# Patient Record
Sex: Male | Born: 1945
Health system: Southern US, Community
[De-identification: ages and names within clinical notes are randomized; demographics above are authoritative.]

## PROBLEM LIST (undated history)

## (undated) DIAGNOSIS — N400 Enlarged prostate without lower urinary tract symptoms: Secondary | ICD-10-CM

## (undated) DIAGNOSIS — D496 Neoplasm of unspecified behavior of brain: Secondary | ICD-10-CM

## (undated) DIAGNOSIS — R339 Retention of urine, unspecified: Secondary | ICD-10-CM

## (undated) DIAGNOSIS — E785 Hyperlipidemia, unspecified: Secondary | ICD-10-CM

## (undated) DIAGNOSIS — I10 Essential (primary) hypertension: Secondary | ICD-10-CM

## (undated) DIAGNOSIS — H9190 Unspecified hearing loss, unspecified ear: Secondary | ICD-10-CM

## (undated) DIAGNOSIS — T7840XA Allergy, unspecified, initial encounter: Secondary | ICD-10-CM

## (undated) DIAGNOSIS — M1990 Unspecified osteoarthritis, unspecified site: Secondary | ICD-10-CM

## (undated) DIAGNOSIS — H8390 Unspecified disease of inner ear, unspecified ear: Secondary | ICD-10-CM

## (undated) HISTORY — DX: Hyperlipidemia, unspecified: E78.5

## (undated) HISTORY — PX: NERVE GRAFT: SHX721

## (undated) HISTORY — DX: Benign prostatic hyperplasia without lower urinary tract symptoms: N40.0

## (undated) HISTORY — DX: Neoplasm of unspecified behavior of brain: D49.6

## (undated) HISTORY — DX: Allergy, unspecified, initial encounter: T78.40XA

## (undated) HISTORY — DX: Essential (primary) hypertension: I10

## (undated) HISTORY — DX: Unspecified osteoarthritis, unspecified site: M19.90

## (undated) HISTORY — PX: BRAIN TUMOR EXCISION: SHX577

## (undated) HISTORY — PX: JOINT REPLACEMENT: SHX530

## (undated) HISTORY — DX: Unspecified hearing loss, unspecified ear: H91.90

## (undated) HISTORY — PX: CARDIAC CATHETERIZATION: SHX172

## (undated) HISTORY — PX: WISDOM TOOTH EXTRACTION: SHX21

## (undated) HISTORY — PX: TOTAL HIP ARTHROPLASTY: SHX124

---
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
## 2000-03-09 ENCOUNTER — Emergency Department (HOSPITAL_COMMUNITY): Admission: EM | Admit: 2000-03-09 | Discharge: 2000-03-09 | Payer: Self-pay | Admitting: *Deleted

## 2002-10-14 ENCOUNTER — Encounter: Payer: Self-pay | Admitting: Emergency Medicine

## 2002-10-14 ENCOUNTER — Encounter: Admission: RE | Admit: 2002-10-14 | Discharge: 2002-10-14 | Payer: Self-pay | Admitting: Emergency Medicine

## 2008-06-20 DIAGNOSIS — N400 Enlarged prostate without lower urinary tract symptoms: Secondary | ICD-10-CM

## 2008-06-20 HISTORY — DX: Benign prostatic hyperplasia without lower urinary tract symptoms: N40.0

## 2009-04-09 LAB — HM COLONOSCOPY: HM Colonoscopy: NORMAL

## 2011-04-21 DIAGNOSIS — Z96649 Presence of unspecified artificial hip joint: Secondary | ICD-10-CM | POA: Insufficient documentation

## 2012-06-28 ENCOUNTER — Ambulatory Visit (INDEPENDENT_AMBULATORY_CARE_PROVIDER_SITE_OTHER): Payer: 59 | Admitting: Internal Medicine

## 2012-06-28 ENCOUNTER — Encounter: Payer: Self-pay | Admitting: Internal Medicine

## 2012-06-28 ENCOUNTER — Other Ambulatory Visit (INDEPENDENT_AMBULATORY_CARE_PROVIDER_SITE_OTHER): Payer: 59

## 2012-06-28 VITALS — BP 136/88 | HR 78 | Temp 98.5°F | Resp 16 | Ht 69.0 in | Wt 190.2 lb

## 2012-06-28 DIAGNOSIS — E7800 Pure hypercholesterolemia, unspecified: Secondary | ICD-10-CM

## 2012-06-28 DIAGNOSIS — R972 Elevated prostate specific antigen [PSA]: Secondary | ICD-10-CM

## 2012-06-28 DIAGNOSIS — Z23 Encounter for immunization: Secondary | ICD-10-CM

## 2012-06-28 DIAGNOSIS — N400 Enlarged prostate without lower urinary tract symptoms: Secondary | ICD-10-CM

## 2012-06-28 DIAGNOSIS — I10 Essential (primary) hypertension: Secondary | ICD-10-CM

## 2012-06-28 DIAGNOSIS — Z1211 Encounter for screening for malignant neoplasm of colon: Secondary | ICD-10-CM | POA: Insufficient documentation

## 2012-06-28 DIAGNOSIS — Z0000 Encounter for general adult medical examination without abnormal findings: Secondary | ICD-10-CM

## 2012-06-28 DIAGNOSIS — Z0001 Encounter for general adult medical examination with abnormal findings: Secondary | ICD-10-CM | POA: Insufficient documentation

## 2012-06-28 LAB — CBC WITH DIFFERENTIAL/PLATELET
Basophils Absolute: 0 10*3/uL (ref 0.0–0.1)
Basophils Relative: 0.6 % (ref 0.0–3.0)
Eosinophils Absolute: 0.2 10*3/uL (ref 0.0–0.7)
Lymphocytes Relative: 27.6 % (ref 12.0–46.0)
MCHC: 34.3 g/dL (ref 30.0–36.0)
Neutrophils Relative %: 59.4 % (ref 43.0–77.0)
RBC: 4.38 Mil/uL (ref 4.22–5.81)
RDW: 12.8 % (ref 11.5–14.6)

## 2012-06-28 LAB — URINALYSIS, ROUTINE W REFLEX MICROSCOPIC
Hgb urine dipstick: NEGATIVE
Nitrite: NEGATIVE
Specific Gravity, Urine: 1.015 (ref 1.000–1.030)
Total Protein, Urine: NEGATIVE
pH: 6 (ref 5.0–8.0)

## 2012-06-28 LAB — COMPREHENSIVE METABOLIC PANEL
AST: 29 U/L (ref 0–37)
Albumin: 4.6 g/dL (ref 3.5–5.2)
BUN: 14 mg/dL (ref 6–23)
CO2: 26 mEq/L (ref 19–32)
Calcium: 9.8 mg/dL (ref 8.4–10.5)
Chloride: 96 mEq/L (ref 96–112)
GFR: 88.92 mL/min (ref >60.00)
Potassium: 4.6 mEq/L (ref 3.5–5.1)

## 2012-06-28 LAB — LIPID PANEL
Cholesterol: 193 mg/dL (ref 0–200)
LDL Cholesterol: 114 mg/dL — ABNORMAL HIGH (ref 0–99)

## 2012-06-28 LAB — PSA: PSA: 4.62 ng/mL — ABNORMAL HIGH (ref 0.10–4.00)

## 2012-06-28 MED ORDER — HYDROCHLOROTHIAZIDE 25 MG PO TABS: 25.0000 mg | ORAL_TABLET | Freq: Every day | ORAL | Status: DC

## 2012-06-28 MED ORDER — LISINOPRIL 40 MG PO TABS: 40.0000 mg | ORAL_TABLET | Freq: Every day | ORAL | Status: DC

## 2012-06-28 NOTE — Assessment & Plan Note (Signed)
His BP is well controlled I will check his lytes and renal function 

## 2012-06-28 NOTE — Progress Notes (Signed)
Subjective:    Patient ID: Samuel Fisher, male    DOB: 04-13-1946, 67 y.o.   MRN: [id number]  Hypertension This is a chronic problem. The current episode started more than 1 year ago. The problem is unchanged. The problem is controlled. Pertinent negatives include no anxiety, blurred vision, chest pain, headaches, malaise/fatigue, neck pain, orthopnea, palpitations, peripheral edema, PND, shortness of breath or sweats. Agents associated with hypertension include NSAIDs. Past treatments include diuretics and ACE inhibitors. The current treatment provides significant improvement. There are no compliance problems.       Review of Systems  Constitutional: Negative.  Negative for malaise/fatigue.  HENT: Negative.  Negative for neck pain.   Eyes: Negative.  Negative for blurred vision.  Respiratory: Negative for apnea, cough, choking, chest tightness, shortness of breath, wheezing and stridor.   Cardiovascular: Negative for chest pain, palpitations, orthopnea, leg swelling and PND.  Gastrointestinal: Negative.  Negative for abdominal pain, diarrhea, constipation and blood in stool.  Genitourinary: Positive for difficulty urinating (nocturia, hesitancy, weak stream). Negative for dysuria, urgency, frequency, hematuria, flank pain, decreased urine volume, discharge, penile swelling, scrotal swelling, enuresis, genital sores, penile pain and testicular pain.  Musculoskeletal: Positive for arthralgias. Negative for myalgias, back pain, joint swelling and gait problem.  Skin: Negative.   Neurological: Negative.  Negative for dizziness, syncope, weakness, light-headedness and headaches.  Hematological: Negative for adenopathy. Does not bruise/bleed easily.  Psychiatric/Behavioral: Negative.        Objective:   Physical Exam  Vitals reviewed. Constitutional: He is oriented to person, place, and time. He appears well-developed and well-nourished. No distress.  HENT:  Head: Normocephalic and  atraumatic.  Mouth/Throat: Oropharynx is clear and moist. No oropharyngeal exudate.  Eyes: Conjunctivae normal are normal. Right eye exhibits no discharge. Left eye exhibits no discharge. No scleral icterus.  Neck: Normal range of motion. Neck supple. No JVD present. No tracheal deviation present. No thyromegaly present.  Cardiovascular: Normal rate, regular rhythm, normal heart sounds and intact distal pulses.  Exam reveals no gallop and no friction rub.   No murmur heard. Pulmonary/Chest: Effort normal and breath sounds normal. No stridor. No respiratory distress. He has no wheezes. He has no rales. He exhibits no tenderness.  Abdominal: Soft. Bowel sounds are normal. He exhibits no distension and no mass. There is no tenderness. There is no rebound and no guarding. Hernia confirmed negative in the right inguinal area and confirmed negative in the left inguinal area.  Genitourinary: Rectum normal, testes normal and penis normal. Rectal exam shows no external hemorrhoid, no internal hemorrhoid, no fissure, no mass, no tenderness and anal tone normal. Guaiac negative stool. Prostate is enlarged (2+ bilat symm BPH). Prostate is not tender. Right testis shows no mass, no swelling and no tenderness. Right testis is descended. Left testis shows no mass, no swelling and no tenderness. Left testis is descended. Circumcised. No penile erythema or penile tenderness. No discharge found.  Musculoskeletal: Normal range of motion. He exhibits no edema and no tenderness.  Lymphadenopathy:    He has no cervical adenopathy.       Right: No inguinal adenopathy present.       Left: No inguinal adenopathy present.  Neurological: He is oriented to person, place, and time.  Skin: Skin is warm and dry. No rash noted. He is not diaphoretic. No erythema. No pallor.  Psychiatric: He has a normal mood and affect. His behavior is normal. Judgment and thought content normal.  Assessment & Plan:

## 2012-06-28 NOTE — Assessment & Plan Note (Signed)
FLP CMP TSH today 

## 2012-06-28 NOTE — Assessment & Plan Note (Signed)
I will check his PSA today He does not wish to treat this at this time

## 2012-06-28 NOTE — Addendum Note (Signed)
Addended by: Etta Grandchild on: 06/28/2012 01:32 PM   Modules accepted: Orders

## 2012-06-28 NOTE — Assessment & Plan Note (Signed)
Exam done Vaccines were reviewed and updated Labs ordered Pt ed material was given 

## 2012-06-28 NOTE — Patient Instructions (Signed)
Health Maintenance, Males A healthy lifestyle and preventative care can promote health and wellness.  Maintain regular health, dental, and eye exams.  Eat a healthy diet. Foods like vegetables, fruits, whole grains, low-fat dairy products, and lean protein foods contain the nutrients you need without too many calories. Decrease your intake of foods high in solid fats, added sugars, and salt. Get information about a proper diet from your caregiver, if necessary.  Regular physical exercise is one of the most important things you can do for your health. Most adults should get at least 150 minutes of moderate-intensity exercise (any activity that increases your heart rate and causes you to sweat) each week. In addition, most adults need muscle-strengthening exercises on 2 or more days a week.   Maintain a healthy weight. The body mass index (BMI) is a screening tool to identify possible weight problems. It provides an estimate of body fat based on height and weight. Your caregiver can help determine your BMI, and can help you achieve or maintain a healthy weight. For adults 20 years and older:  A BMI below 18.5 is considered underweight.  A BMI of 18.5 to 24.9 is normal.  A BMI of 25 to 29.9 is considered overweight.  A BMI of 30 and above is considered obese.  Maintain normal blood lipids and cholesterol by exercising and minimizing your intake of saturated fat. Eat a balanced diet with plenty of fruits and vegetables. Blood tests for lipids and cholesterol should begin at age 20 and be repeated every 5 years. If your lipid or cholesterol levels are high, you are over 50, or you are a high risk for heart disease, you may need your cholesterol levels checked more frequently.Ongoing high lipid and cholesterol levels should be treated with medicines, if diet and exercise are not effective.  If you smoke, find out from your caregiver how to quit. If you do not use tobacco, do not start.  If you  choose to drink alcohol, do not exceed 2 drinks per day. One drink is considered to be 12 ounces (355 mL) of beer, 5 ounces (148 mL) of wine, or 1.5 ounces (44 mL) of liquor.  Avoid use of street drugs. Do not share needles with anyone. Ask for help if you need support or instructions about stopping the use of drugs.  High blood pressure causes heart disease and increases the risk of stroke. Blood pressure should be checked at least every 1 to 2 years. Ongoing high blood pressure should be treated with medicines if weight loss and exercise are not effective.  If you are 45 to 65 years old, ask your caregiver if you should take aspirin to prevent heart disease.  Diabetes screening involves taking a blood sample to check your fasting blood sugar level. This should be done once every 3 years, after age 45, if you are within normal weight and without risk factors for diabetes. Testing should be considered at a younger age or be carried out more frequently if you are overweight and have at least 1 risk factor for diabetes.  Colorectal cancer can be detected and often prevented. Most routine colorectal cancer screening begins at the age of 50 and continues through age 75. However, your caregiver may recommend screening at an earlier age if you have risk factors for colon cancer. On a yearly basis, your caregiver may provide home test kits to check for hidden blood in the stool. Use of a small camera at the end of a tube,   to directly examine the colon (sigmoidoscopy or colonoscopy), can detect the earliest forms of colorectal cancer. Talk to your caregiver about this at age 50, when routine screening begins. Direct examination of the colon should be repeated every 5 to 10 years through age 75, unless early forms of pre-cancerous polyps or small growths are found.  Hepatitis C blood testing is recommended for all people born from 1945 through 1965 and any individual with known risks for hepatitis C.  Healthy  men should no longer receive prostate-specific antigen (PSA) blood tests as part of routine cancer screening. Consult with your caregiver about prostate cancer screening.  Testicular cancer screening is not recommended for adolescents or adult males who have no symptoms. Screening includes self-exam, caregiver exam, and other screening tests. Consult with your caregiver about any symptoms you have or any concerns you have about testicular cancer.  Practice safe sex. Use condoms and avoid high-risk sexual practices to reduce the spread of sexually transmitted infections (STIs).  Use sunscreen with a sun protection factor (SPF) of 30 or greater. Apply sunscreen liberally and repeatedly throughout the day. You should seek shade when your shadow is shorter than you. Protect yourself by wearing long sleeves, pants, a wide-brimmed hat, and sunglasses year round, whenever you are outdoors.  Notify your caregiver of new moles or changes in moles, especially if there is a change in shape or color. Also notify your caregiver if a mole is larger than the size of a pencil eraser.  A one-time screening for abdominal aortic aneurysm (AAA) and surgical repair of large AAAs by sound wave imaging (ultrasonography) is recommended for ages 65 to 75 years who are current or former smokers.  Stay current with your immunizations. Document Released: 12/03/2007 Document Revised: 08/29/2011 Document Reviewed: 11/01/2010 [employer] Patient Information 2013 [employer].  

## 2012-10-16 ENCOUNTER — Encounter: Payer: Self-pay | Admitting: Internal Medicine

## 2012-10-16 DIAGNOSIS — I10 Essential (primary) hypertension: Secondary | ICD-10-CM

## 2012-10-16 MED ORDER — HYDROCHLOROTHIAZIDE 25 MG PO TABS: 25.0000 mg | ORAL_TABLET | Freq: Every day | ORAL | Status: DC

## 2012-10-16 MED ORDER — LISINOPRIL 40 MG PO TABS: 40.0000 mg | ORAL_TABLET | Freq: Every day | ORAL | Status: DC

## 2013-01-23 ENCOUNTER — Other Ambulatory Visit: Payer: Self-pay

## 2013-01-28 DIAGNOSIS — M169 Osteoarthritis of hip, unspecified: Secondary | ICD-10-CM | POA: Insufficient documentation

## 2013-04-25 ENCOUNTER — Other Ambulatory Visit: Payer: Self-pay

## 2013-06-27 ENCOUNTER — Ambulatory Visit (INDEPENDENT_AMBULATORY_CARE_PROVIDER_SITE_OTHER): Payer: Medicare Other | Admitting: *Deleted

## 2013-06-27 DIAGNOSIS — Z23 Encounter for immunization: Secondary | ICD-10-CM

## 2014-03-12 ENCOUNTER — Ambulatory Visit (INDEPENDENT_AMBULATORY_CARE_PROVIDER_SITE_OTHER): Payer: Medicare Other

## 2014-03-12 DIAGNOSIS — Z23 Encounter for immunization: Secondary | ICD-10-CM

## 2014-11-19 ENCOUNTER — Ambulatory Visit (INDEPENDENT_AMBULATORY_CARE_PROVIDER_SITE_OTHER): Payer: Medicare Other | Admitting: Family Medicine

## 2014-11-19 VITALS — BP 132/84 | HR 102 | Temp 99.3°F | Resp 16 | Ht 69.0 in | Wt 184.4 lb

## 2014-11-19 DIAGNOSIS — L237 Allergic contact dermatitis due to plants, except food: Secondary | ICD-10-CM

## 2014-11-19 DIAGNOSIS — Z91048 Other nonmedicinal substance allergy status: Secondary | ICD-10-CM | POA: Diagnosis not present

## 2014-11-19 DIAGNOSIS — Z9109 Other allergy status, other than to drugs and biological substances: Secondary | ICD-10-CM

## 2014-11-19 MED ORDER — EPINEPHRINE 0.3 MG/0.3ML IJ SOAJ: 0.3000 mg | Freq: Once | INTRAMUSCULAR | Status: DC

## 2014-11-19 MED ORDER — METHYLPREDNISOLONE ACETATE 80 MG/ML IJ SUSP
80.0000 mg | Freq: Once | INTRAMUSCULAR | Status: AC
  Administered 2014-11-19: 80 mg via INTRAMUSCULAR

## 2014-11-19 NOTE — Patient Instructions (Signed)
Take Benadryl 25 mg every 6-8 hours until resolved Use hibiclens to clean wound 1-2 times daily Use calamine  Poison Ivy Poison ivy is a inflammation of the skin (contact dermatitis) caused by touching the allergens on the leaves of the ivy plant following previous exposure to the plant. The rash usually appears 48 hours after exposure. The rash is usually bumps (papules) or blisters (vesicles) in a linear pattern. Depending on your own sensitivity, the rash may simply cause redness and itching, or it may also progress to blisters which may break open. These must be well cared for to prevent secondary bacterial (germ) infection, followed by scarring. Keep any open areas dry, clean, dressed, and covered with an antibacterial ointment if needed. The eyes may also get puffy. The puffiness is worst in the morning and gets better as the day progresses. This dermatitis usually heals without scarring, within 2 to 3 weeks without treatment. HOME CARE INSTRUCTIONS  Thoroughly wash with soap and water as soon as you have been exposed to poison ivy. You have about one half hour to remove the plant resin before it will cause the rash. This washing will destroy the oil or antigen on the skin that is causing, or will cause, the rash. Be sure to wash under your fingernails as any plant resin there will continue to spread the rash. Do not rub skin vigorously when washing affected area. Poison ivy cannot spread if no oil from the plant remains on your body. A rash that has progressed to weeping sores will not spread the rash unless you have not washed thoroughly. It is also important to wash any clothes you have been wearing as these may carry active allergens. The rash will return if you wear the unwashed clothing, even several days later. Avoidance of the plant in the future is the best measure. Poison ivy plant can be recognized by the number of leaves. Generally, poison ivy has three leaves with flowering branches on a  single stem. Diphenhydramine may be purchased over the counter and used as needed for itching. Do not drive with this medication if it makes you drowsy.Ask your caregiver about medication for children. SEEK MEDICAL CARE IF:  Open sores develop.  Redness spreads beyond area of rash.  You notice purulent (pus-like) discharge.  You have increased pain.  Other signs of infection develop (such as fever). Document Released: 06/03/2000 Document Revised: 08/29/2011 Document Reviewed: 11/14/2008 [facility] Patient Information 2015 [place], [place]. This information is not intended to replace advice given to you by your health care provider. Make sure you discuss any questions you have with your health care provider.

## 2014-11-19 NOTE — Progress Notes (Signed)
 Chief Complaint:  Chief Complaint  Patient presents with  . Rash    x 5 days ago, was working in yard thinks he has posion ivy, on both arms and knees  . Insect Bite    Genital area, Has cleared up some    HPI: Samuel Fisher is a 69 y.o. male who is here for posion ivy rash and bug bite on his penis He is ok with the bug bite on his genitals, no reaction. The rash has improved greatly. He does not know about it was. He does not think it was a tick. He has allergies to wasp, he has been to the ER for that 35 years ago, he breaks out in welps He denies SOB or CP   Was working in his yard and he cuts of exposure to poison IV/okay and has developed a rash on his arms or legs. He is tried over-the-counter medications without relief. This was 5 days ago.  Past Medical History  Diagnosis Date  . Arthritis   . Allergy   . Hypertension   . BPH (benign prostatic hyperplasia) 2010   Past Surgical History  Procedure Laterality Date  . Tonsillectomy  1951  . Total hip arthroplasty      right-2002 and left-2004, 2010  . Joint replacement     History   Social History  . Marital Status: Married    Spouse Name: N/A  . Number of Children: N/A  . Years of Education: N/A   Social History Main Topics  . Smoking status: Former Smoker    Quit date: 06/28/1976  . Smokeless tobacco: Never Used  . Alcohol Use: 1.8 oz/week    3 Cans of beer per week  . Drug Use: No  . Sexual Activity: Yes   Other Topics Concern  . None   Social History Narrative   Family History  Problem Relation Age of Onset  . Arthritis Other   . Heart disease Other   . Hypertension Other   . Stroke Other   . Early death Other   . Mental illness Other   . Diabetes Neg Hx   . Hyperlipidemia Neg Hx   . Cancer Mother   . Cancer Father   . Hypertension Father    No Known Allergies Prior to Admission medications   Medication Sig Start Date End Date Taking? Authorizing Provider  aspirin 325 MG  tablet Take 325 mg by mouth daily.   Yes Historical Provider, MD  hydrochlorothiazide (HYDRODIURIL) 25 MG tablet Take 1 tablet (25 mg total) by mouth daily. 10/16/12  Yes [name], MD  lisinopril (PRINIVIL,ZESTRIL) 40 MG tablet Take 1 tablet (40 mg total) by mouth daily. 10/16/12  Yes [name], MD     ROS: The patient denies fevers, chills, night sweats, unintentional weight loss, chest pain, palpitations, wheezing, dyspnea on exertion, nausea, vomiting, abdominal pain, dysuria, hematuria, melena, numbness, weakness, or tingling.  All other systems have been reviewed and were otherwise negative with the exception of those mentioned in the HPI and as above.    PHYSICAL EXAM: Filed Vitals:   11/19/14 1538  BP: 132/84  Pulse: 102  Temp: 99.3 F (37.4 C)  Resp: 16   Filed Vitals:   11/19/14 1538  Height: 5\' 9"  (1.753 m)  Weight: 184 lb 6.4 oz (83.643 kg)   Body mass index is 27.22 kg/(m^2). SpO2 Readings from Last 3 Encounters:  11/19/14 98%  06/28/12 97%  General: Alert, no acute distress HEENT:  Normocephalic, atraumatic, oropharynx patent. EOMI, PERRLA Cardiovascular:  Regular rate and rhythm, no rubs murmurs or gallops.  No pedal edema.  Respiratory: Clear to auscultation bilaterally.  No wheezes, rales, or rhonchi.  No cyanosis, no use of accessory musculature GI: No organomegaly, abdomen is soft and non-tender, positive bowel sounds.  No masses. Skin: + posion ivy rash on arms and hands,  Neurologic: Facial musculature symmetric. Psychiatric: Patient is appropriate throughout our interaction. Lymphatic: No cervical lymphadenopathy Musculoskeletal: Gait intact.   LABS: Results for orders placed or performed in visit on 06/28/12  Fecal Occult Blood, Guaiac  Result Value Ref Range   Fecal Occult Blood Negative   HM COLONOSCOPY  Result Value Ref Range   HM Colonoscopy normal by his report Medoff      EKG/XRAY:   Primary read interpreted by Dr. Marin Comment at  [facility].   ASSESSMENT/PLAN: Encounter Diagnoses  Name Primary?  . Poison ivy dermatitis Yes  . Environmental allergies    Depo-Medrol 80 mg by mouth 1 Continue with anti-, histamine, calamine lotion Prescribed EpiPen  AS needed,   Gross sideeffects, risk and benefits, and alternatives of medications d/w patient. Patient is aware that all medications have potential sideeffects and we are unable to predict every sideeffect or drug-drug interaction that may occur.  , Belleair Shore, DO 11/21/2014 3:12 AM

## 2015-04-06 ENCOUNTER — Ambulatory Visit (INDEPENDENT_AMBULATORY_CARE_PROVIDER_SITE_OTHER): Payer: Medicare Other | Admitting: Internal Medicine

## 2015-04-06 ENCOUNTER — Encounter: Payer: Self-pay | Admitting: Internal Medicine

## 2015-04-06 ENCOUNTER — Other Ambulatory Visit (INDEPENDENT_AMBULATORY_CARE_PROVIDER_SITE_OTHER): Payer: Medicare Other

## 2015-04-06 VITALS — BP 130/80 | HR 97 | Temp 98.4°F | Resp 16 | Ht 69.0 in | Wt 191.0 lb

## 2015-04-06 DIAGNOSIS — Z0000 Encounter for general adult medical examination without abnormal findings: Secondary | ICD-10-CM

## 2015-04-06 DIAGNOSIS — I10 Essential (primary) hypertension: Secondary | ICD-10-CM

## 2015-04-06 DIAGNOSIS — E7800 Pure hypercholesterolemia, unspecified: Secondary | ICD-10-CM | POA: Diagnosis not present

## 2015-04-06 DIAGNOSIS — N400 Enlarged prostate without lower urinary tract symptoms: Secondary | ICD-10-CM

## 2015-04-06 DIAGNOSIS — Z23 Encounter for immunization: Secondary | ICD-10-CM | POA: Diagnosis not present

## 2015-04-06 DIAGNOSIS — R972 Elevated prostate specific antigen [PSA]: Secondary | ICD-10-CM

## 2015-04-06 LAB — CBC WITH DIFFERENTIAL/PLATELET
BASOS PCT: 0.7 % (ref 0.0–3.0)
Basophils Absolute: 0 10*3/uL (ref 0.0–0.1)
EOS PCT: 1.6 % (ref 0.0–5.0)
Eosinophils Absolute: 0.1 10*3/uL (ref 0.0–0.7)
HEMATOCRIT: 37.6 % — AB (ref 39.0–52.0)
HEMOGLOBIN: 13.1 g/dL (ref 13.0–17.0)
Lymphocytes Relative: 16.5 % (ref 12.0–46.0)
Lymphs Abs: 0.8 10*3/uL (ref 0.7–4.0)
MCHC: 34.8 g/dL (ref 30.0–36.0)
MCV: 95.8 fl (ref 78.0–100.0)
MONO ABS: 0.5 10*3/uL (ref 0.1–1.0)
MONOS PCT: 10.6 % (ref 3.0–12.0)
Neutro Abs: 3.6 10*3/uL (ref 1.4–7.7)
Neutrophils Relative %: 70.6 % (ref 43.0–77.0)
Platelets: 221 10*3/uL (ref 150.0–400.0)
RBC: 3.92 Mil/uL — ABNORMAL LOW (ref 4.22–5.81)
RDW: 13.1 % (ref 11.5–15.5)
WBC: 5.1 10*3/uL (ref 4.0–10.5)

## 2015-04-06 LAB — LIPID PANEL
Cholesterol: 190 mg/dL (ref 0–200)
HDL: 96.1 mg/dL (ref >39.00)
LDL Cholesterol: 79 mg/dL (ref 0–99)
NONHDL: 93.41
Total CHOL/HDL Ratio: 2
Triglycerides: 74 mg/dL (ref 0.0–149.0)
VLDL: 14.8 mg/dL (ref 0.0–40.0)

## 2015-04-06 LAB — COMPREHENSIVE METABOLIC PANEL
ALT: 24 U/L (ref 0–53)
AST: 28 U/L (ref 0–37)
Albumin: 4.4 g/dL (ref 3.5–5.2)
Alkaline Phosphatase: 45 U/L (ref 39–117)
BUN: 11 mg/dL (ref 6–23)
CALCIUM: 9.9 mg/dL (ref 8.4–10.5)
CHLORIDE: 94 meq/L — AB (ref 96–112)
CO2: 27 mEq/L (ref 19–32)
Creatinine, Ser: 1.09 mg/dL (ref 0.40–1.50)
GFR: 71.59 mL/min (ref >60.00)
Glucose, Bld: 111 mg/dL — ABNORMAL HIGH (ref 70–99)
Potassium: 3.9 mEq/L (ref 3.5–5.1)
Sodium: 131 mEq/L — ABNORMAL LOW (ref 135–145)
Total Bilirubin: 0.6 mg/dL (ref 0.2–1.2)
Total Protein: 6.9 g/dL (ref 6.0–8.3)

## 2015-04-06 LAB — FECAL OCCULT BLOOD, GUAIAC: Fecal Occult Blood: NEGATIVE

## 2015-04-06 LAB — TSH: TSH: 0.84 u[IU]/mL (ref 0.35–4.50)

## 2015-04-06 LAB — PSA: PSA: 6.2 ng/mL — ABNORMAL HIGH (ref 0.10–4.00)

## 2015-04-06 NOTE — Progress Notes (Signed)
Subjective:  Patient ID: Samuel Fisher, male    DOB: 1946-03-16  Age: 69 y.o. MRN: [id number]  CC: Hypertension and Annual Exam   HPI ADRICK KESTLER presents for a CPX and BP check. He offers no complaints today. He had a prostate biopsy about 9-10 months ago due to an elevated PSA and the results were inconclusive, he and his urologist have decided to just monitor his PSA level without intervention.  Outpatient Prescriptions Prior to Visit  Medication Sig Dispense Refill  . aspirin 325 MG tablet Take 325 mg by mouth daily.    Marland Kitchen EPINEPHrine 0.3 mg/0.3 mL IJ SOAJ injection Inject 0.3 mLs (0.3 mg total) into the muscle once. 2 Device 0  . hydrochlorothiazide (HYDRODIURIL) 25 MG tablet Take 1 tablet (25 mg total) by mouth daily. 90 tablet 3  . lisinopril (PRINIVIL,ZESTRIL) 40 MG tablet Take 1 tablet (40 mg total) by mouth daily. 90 tablet 3   No facility-administered medications prior to visit.    ROS Review of Systems  Constitutional: Negative.  Negative for fever, chills, diaphoresis, appetite change and fatigue.  HENT: Negative.   Eyes: Negative.   Respiratory: Negative.  Negative for cough, choking, chest tightness, shortness of breath and stridor.   Cardiovascular: Negative.  Negative for chest pain, palpitations and leg swelling.  Gastrointestinal: Negative.  Negative for nausea, vomiting, abdominal pain, diarrhea, constipation and blood in stool.  Endocrine: Negative.   Genitourinary: Negative.  Negative for dysuria, urgency, hematuria, flank pain, decreased urine volume, enuresis and difficulty urinating.  Musculoskeletal: Negative.   Skin: Negative.   Allergic/Immunologic: Negative.   Neurological: Negative.  Negative for dizziness, syncope, speech difficulty, weakness, light-headedness, numbness and headaches.  Hematological: Negative.  Negative for adenopathy. Does not bruise/bleed easily.  Psychiatric/Behavioral: Negative.     Objective:  BP 130/80 mmHg   Pulse 97  Temp(Src) 98.4 F (36.9 C) (Oral)  Resp 16  Ht 5\' 9"  (1.753 m)  Wt 191 lb (86.637 kg)  BMI 28.19 kg/m2  SpO2 97%  BP Readings from Last 3 Encounters:  04/06/15 130/80  11/19/14 132/84  06/28/12 136/88    Wt Readings from Last 3 Encounters:  04/06/15 191 lb (86.637 kg)  11/19/14 184 lb 6.4 oz (83.643 kg)  06/28/12 190 lb 4 oz (86.297 kg)    Physical Exam  Constitutional: He is oriented to person, place, and time. He appears well-developed and well-nourished. No distress.  HENT:  Head: Normocephalic and atraumatic.  Mouth/Throat: Oropharynx is clear and moist. No oropharyngeal exudate.  Eyes: Conjunctivae are normal. Right eye exhibits no discharge. Left eye exhibits no discharge. No scleral icterus.  Neck: Normal range of motion. Neck supple. No JVD present. No tracheal deviation present. No thyromegaly present.  Cardiovascular: Normal rate, regular rhythm, normal heart sounds and intact distal pulses.  Exam reveals no gallop and no friction rub.   No murmur heard. Pulmonary/Chest: Effort normal and breath sounds normal. No stridor. No respiratory distress. He has no wheezes. He has no rales. He exhibits no tenderness.  Abdominal: Soft. Bowel sounds are normal. He exhibits no distension and no mass. There is no tenderness. There is no rebound and no guarding.  Musculoskeletal: Normal range of motion. He exhibits no edema or tenderness.  Lymphadenopathy:    He has no cervical adenopathy.  Neurological: He is oriented to person, place, and time.  Skin: Skin is warm and dry. No rash noted. He is not diaphoretic. No erythema. No pallor.  Psychiatric: He has a  normal mood and affect. His behavior is normal. Judgment and thought content normal.    Lab Results  Component Value Date   WBC 5.1 04/06/2015   HGB 13.1 04/06/2015   HCT 37.6* 04/06/2015   PLT 221.0 04/06/2015   GLUCOSE 111* 04/06/2015   CHOL 190 04/06/2015   TRIG 74.0 04/06/2015   HDL 96.10 04/06/2015    LDLCALC 79 04/06/2015   ALT 24 04/06/2015   AST 28 04/06/2015   NA 131* 04/06/2015   K 3.9 04/06/2015   CL 94* 04/06/2015   CREATININE 1.09 04/06/2015   BUN 11 04/06/2015   CO2 27 04/06/2015   TSH 0.84 04/06/2015   PSA 6.20* 04/06/2015    Dg Hip Complete Left  10/15/2002  FINDINGS CLINICAL DATA:  HISTORY OF RIGHT THR.  LEFT HIP PAIN. LEFT HIP COMPLETE - THREE VIEWS A RIGHT TOTAL HIP PROSTHESIS IS NOTED WITH ACETABULAR AND FEMORAL COMPONENTS.  PERIPROSTHETIC LUCENCY IS NOTED IN THE PROXIMAL RIGHT FEMORAL REGION MEASURING UP TO A FEW MM.  SEVERE DEGENERATIVE OA CHANGES LEFT HIP JOINT WITH MARKED CARTILAGE LOSS PARTICULARLY ALONG THE SUPERIOR WEIGHT BEARING PORTION AND PROMINENT SUBARTICULAR GEODES/CYSTS INVOLVING THE ACETABULUM AND FEMORAL HEAD.  SOME SLIGHT FLATTENING OF THE SUPERIOR ASPECT OF THE FEMORAL HEAD LIKELY DUE TO SOME REMODELING AND POTENTIALLY AVASCULAR NECROSIS.  OSTEOPHYTIC FORMATION.  MILD DEGENERATIVE CHANGES LEFT SI JOINT. IMPRESSION SEVERE DEGENERATIVE OA CHANGES INVOLVE THE LEFT HIP JOINT.   Assessment & Plan:   [place] was seen today for hypertension and annual exam.  Diagnoses and all orders for this visit:  Essential hypertension, benign- his BP is well controlled, lytes and renal function are stable -     Comprehensive metabolic panel; Future -     CBC with Differential/Platelet; Future  Pure hypercholesterolemia- Framingham risk score is 10%, however he is not willing to start a statin -     Lipid panel; Future -     TSH; Future  Need for influenza vaccination -     Flu Vaccine QUAD 36+ mos IM  Need for vaccination with 13-polyvalent pneumococcal conjugate vaccine -     Pneumococcal conjugate vaccine 13-valent  PSA elevation- his PSA is down from 9.9 earlier this year to 6.2, this is encouraging, he will follow up with his urologist as recommended -     PSA; Future  BPH (benign prostatic hyperplasia) -     PSA; Future   I am having Mr. Haak  maintain his aspirin, lisinopril, hydrochlorothiazide, and EPINEPHrine.  No orders of the defined types were placed in this encounter.     Follow-up: Return in about 6 months (around 10/05/2015).  Scarlette Calico, MD

## 2015-04-06 NOTE — Progress Notes (Signed)
Pre visit review using our clinic review tool, if applicable. No additional management support is needed unless otherwise documented below in the visit note. 

## 2015-04-06 NOTE — Patient Instructions (Signed)

## 2015-04-07 NOTE — Assessment & Plan Note (Signed)

## 2015-07-22 ENCOUNTER — Telehealth: Payer: Self-pay | Admitting: Internal Medicine

## 2015-07-22 NOTE — Telephone Encounter (Signed)
Has last PSA was 6.2

## 2015-07-22 NOTE — Telephone Encounter (Signed)
faxed

## 2015-07-22 NOTE — Telephone Encounter (Signed)
2.1.17 Pt is requesting that the results of his PSA that was ordered on 10.17.16 be faxed or sent to Dr. Louis Meckel at [facility]. Please call pt with results of the PSA. MS

## 2015-07-22 NOTE — Telephone Encounter (Signed)
Per Markir pt is going to look at his labs on MyChart. Will send results to [facility]

## 2015-08-17 DIAGNOSIS — R972 Elevated prostate specific antigen [PSA]: Secondary | ICD-10-CM | POA: Diagnosis not present

## 2015-08-17 DIAGNOSIS — Z0000 Encounter for general adult medical examination without abnormal findings: Secondary | ICD-10-CM | POA: Diagnosis not present

## 2015-08-25 DIAGNOSIS — R972 Elevated prostate specific antigen [PSA]: Secondary | ICD-10-CM | POA: Diagnosis not present

## 2015-10-30 DIAGNOSIS — H5213 Myopia, bilateral: Secondary | ICD-10-CM | POA: Diagnosis not present

## 2015-11-10 ENCOUNTER — Encounter: Payer: Self-pay | Admitting: Internal Medicine

## 2015-11-10 ENCOUNTER — Ambulatory Visit (INDEPENDENT_AMBULATORY_CARE_PROVIDER_SITE_OTHER): Payer: Medicare Other | Admitting: Internal Medicine

## 2015-11-10 VITALS — BP 132/70 | HR 81 | Temp 98.2°F | Resp 20 | Wt 183.0 lb

## 2015-11-10 DIAGNOSIS — H9192 Unspecified hearing loss, left ear: Secondary | ICD-10-CM | POA: Diagnosis not present

## 2015-11-10 DIAGNOSIS — I10 Essential (primary) hypertension: Secondary | ICD-10-CM | POA: Diagnosis not present

## 2015-11-10 DIAGNOSIS — H6980 Other specified disorders of Eustachian tube, unspecified ear: Secondary | ICD-10-CM | POA: Insufficient documentation

## 2015-11-10 DIAGNOSIS — H6981 Other specified disorders of Eustachian tube, right ear: Secondary | ICD-10-CM

## 2015-11-10 DIAGNOSIS — H6091 Unspecified otitis externa, right ear: Secondary | ICD-10-CM | POA: Diagnosis not present

## 2015-11-10 MED ORDER — NEOMYCIN-POLYMYXIN-HC 3.5-10000-1 OT SOLN: 4.0000 [drp] | Freq: Four times a day (QID) | OTIC | Status: DC

## 2015-11-10 NOTE — Patient Instructions (Signed)
Please take all new medication as prescribed - the antibiotic  You can also take Mucinex (or it's generic off brand) for congestion and eustachian clogging, and tylenol as needed for pain.  The left ear wax impaction was irrigated and cleared  Please continue all other medications as before, and refills have been done if requested.  Please have the pharmacy call with any other refills you may need.  Please keep your appointments with your specialists as you may have planned

## 2015-11-10 NOTE — Progress Notes (Signed)
Pre visit review using our clinic review tool, if applicable. No additional management support is needed unless otherwise documented below in the visit note. 

## 2015-11-16 NOTE — Assessment & Plan Note (Signed)
Mild to mod, for antibx course,  to f/u any worsening symptoms or concerns 

## 2015-11-16 NOTE — Assessment & Plan Note (Signed)
Improved with irrigation of wax impaction,  to f/u any worsening symptoms or concerns 

## 2015-11-16 NOTE — Assessment & Plan Note (Signed)
stable overall by history and exam, recent data reviewed with pt, and pt to continue medical treatment as before,  to f/u any worsening symptoms or concerns BP Readings from Last 3 Encounters:  11/10/15 132/70  04/06/15 130/80  11/19/14 132/84

## 2015-11-16 NOTE — Assessment & Plan Note (Signed)
Mild to mod, for mucinex otc prn,  to f/u any worsening symptoms or concerns 

## 2015-11-16 NOTE — Progress Notes (Signed)
Subjective:    Patient ID: Samuel Fisher, male    DOB: Feb 28, 1946, 70 y.o.   MRN: [id number]  HPI   Here with 2-3 days acute onset fever, right ear pain, pressure, headache, general weakness and malaise,  with mild ST and cough, but pt denies chest pain, wheezing, increased sob or doe, orthopnea, PND, increased LE swelling, palpitations, dizziness or syncope.  Also with right side popping and crackling, some reduced hearing, but no hearing loss, vertigo or tinnitus.  Also with sudden left hearing loss in the past wk without pain or other symptoms. Past Medical History  Diagnosis Date  . Arthritis   . Allergy   . Hypertension   . BPH (benign prostatic hyperplasia) 2010   Past Surgical History  Procedure Laterality Date  . Tonsillectomy  1951  . Total hip arthroplasty      right-2002 and left-2004, 2010  . Joint replacement      reports that he quit smoking about 39 years ago. He has never used smokeless tobacco. He reports that he drinks about 1.8 oz of alcohol per week. He reports that he does not use illicit drugs. family history includes Arthritis in his other; Cancer in his father and mother; Early death in his other; Heart disease in his other; Hypertension in his father and other; Mental illness in his other; Stroke in his other. There is no history of Diabetes or Hyperlipidemia. No Known Allergies Current Outpatient Prescriptions on File Prior to Visit  Medication Sig Dispense Refill  . aspirin 325 MG tablet Take 325 mg by mouth daily.    Marland Kitchen EPINEPHrine 0.3 mg/0.3 mL IJ SOAJ injection Inject 0.3 mLs (0.3 mg total) into the muscle once. 2 Device 0  . hydrochlorothiazide (HYDRODIURIL) 25 MG tablet Take 1 tablet (25 mg total) by mouth daily. 90 tablet 3  . lisinopril (PRINIVIL,ZESTRIL) 40 MG tablet Take 1 tablet (40 mg total) by mouth daily. 90 tablet 3   No current facility-administered medications on file prior to visit.   Review of Systems  Constitutional: Negative for  unusual diaphoresis or night sweats HENT: Negative for ear swelling or discharge Eyes: Negative for worsening visual haziness  Respiratory: Negative for choking and stridor.   Gastrointestinal: Negative for distension or worsening eructation Genitourinary: Negative for retention or change in urine volume.  Musculoskeletal: Negative for other MSK pain or swelling Skin: Negative for color change and worsening wound Neurological: Negative for tremors and numbness other than noted  Psychiatric/Behavioral: Negative for decreased concentration or agitation other than above       Objective:   Physical Exam BP 132/70 mmHg  Pulse 81  Temp(Src) 98.2 F (36.8 C) (Oral)  Resp 20  Wt 183 lb (83.008 kg)  SpO2 97% VS noted,  Constitutional: Pt appears in no apparent distress HENT: Head: NCAT.  Right Ear: External ear normal but canal with 1+ red/swelling/tender Left Ear: External ear normal. left wax impaction resolved with irrigation Bilat tm's with mild erythema.  Max sinus areas non tender.  Pharynx with mild erythema, no exudate Eyes: . Pupils are equal, round, and reactive to light. Conjunctivae and EOM are normal Neck: Normal range of motion. Neck supple.  Cardiovascular: Normal rate and regular rhythm.   Pulmonary/Chest: Effort normal and breath sounds without rales or wheezing.  Neurological: Pt is alert. Not confused , motor grossly intact Skin: Skin is warm. No rash, no LE edema Psychiatric: Pt behavior is normal. No agitation.     Assessment & Plan:

## 2015-11-27 ENCOUNTER — Telehealth: Payer: Self-pay | Admitting: Internal Medicine

## 2015-11-27 DIAGNOSIS — H9209 Otalgia, unspecified ear: Secondary | ICD-10-CM

## 2015-11-27 NOTE — Telephone Encounter (Signed)
Ok, referral done 

## 2015-11-27 NOTE — Telephone Encounter (Signed)
Pt called request referral to go to ENT, his ear is not better since Dr. Jenny Reichmann saw him. Please call pt back

## 2015-11-30 NOTE — Telephone Encounter (Signed)
Pt has been notified referral been place.../lmb 

## 2015-12-03 DIAGNOSIS — H9041 Sensorineural hearing loss, unilateral, right ear, with unrestricted hearing on the contralateral side: Secondary | ICD-10-CM | POA: Diagnosis not present

## 2015-12-03 DIAGNOSIS — H6991 Unspecified Eustachian tube disorder, right ear: Secondary | ICD-10-CM | POA: Diagnosis not present

## 2015-12-09 DIAGNOSIS — H9123 Sudden idiopathic hearing loss, bilateral: Secondary | ICD-10-CM | POA: Diagnosis not present

## 2015-12-09 DIAGNOSIS — H903 Sensorineural hearing loss, bilateral: Secondary | ICD-10-CM | POA: Diagnosis not present

## 2015-12-23 DIAGNOSIS — H9123 Sudden idiopathic hearing loss, bilateral: Secondary | ICD-10-CM | POA: Diagnosis not present

## 2015-12-30 ENCOUNTER — Emergency Department (HOSPITAL_COMMUNITY): Payer: Medicare Other

## 2015-12-30 ENCOUNTER — Emergency Department (HOSPITAL_COMMUNITY)
Admission: EM | Admit: 2015-12-30 | Discharge: 2015-12-30 | Disposition: A | Discharge status: Home / Self Care | Payer: Medicare Other | Attending: Emergency Medicine | Admitting: Emergency Medicine

## 2015-12-30 ENCOUNTER — Encounter (HOSPITAL_COMMUNITY): Payer: Self-pay

## 2015-12-30 DIAGNOSIS — Z79899 Other long term (current) drug therapy: Secondary | ICD-10-CM | POA: Diagnosis not present

## 2015-12-30 DIAGNOSIS — R278 Other lack of coordination: Secondary | ICD-10-CM

## 2015-12-30 DIAGNOSIS — I10 Essential (primary) hypertension: Secondary | ICD-10-CM | POA: Diagnosis not present

## 2015-12-30 DIAGNOSIS — R220 Localized swelling, mass and lump, head: Secondary | ICD-10-CM | POA: Diagnosis not present

## 2015-12-30 DIAGNOSIS — Z7982 Long term (current) use of aspirin: Secondary | ICD-10-CM | POA: Diagnosis not present

## 2015-12-30 DIAGNOSIS — D3610 Benign neoplasm of peripheral nerves and autonomic nervous system, unspecified: Secondary | ICD-10-CM

## 2015-12-30 DIAGNOSIS — Z87891 Personal history of nicotine dependence: Secondary | ICD-10-CM | POA: Insufficient documentation

## 2015-12-30 DIAGNOSIS — M1990 Unspecified osteoarthritis, unspecified site: Secondary | ICD-10-CM | POA: Diagnosis not present

## 2015-12-30 DIAGNOSIS — Q8503 Schwannomatosis: Secondary | ICD-10-CM | POA: Diagnosis not present

## 2015-12-30 DIAGNOSIS — Z7951 Long term (current) use of inhaled steroids: Secondary | ICD-10-CM | POA: Insufficient documentation

## 2015-12-30 DIAGNOSIS — R42 Dizziness and giddiness: Secondary | ICD-10-CM | POA: Diagnosis present

## 2015-12-30 HISTORY — DX: Unspecified disease of inner ear, unspecified ear: H83.90

## 2015-12-30 LAB — CBC
HEMATOCRIT: 35.9 % — AB (ref 39.0–52.0)
HEMOGLOBIN: 12.7 g/dL — AB (ref 13.0–17.0)
MCH: 32.8 pg (ref 26.0–34.0)
MCHC: 35.4 g/dL (ref 30.0–36.0)
MCV: 92.8 fL (ref 78.0–100.0)
Platelets: 178 10*3/uL (ref 150–400)
RBC: 3.87 MIL/uL — AB (ref 4.22–5.81)
RDW: 12.8 % (ref 11.5–15.5)
WBC: 7.5 10*3/uL (ref 4.0–10.5)

## 2015-12-30 LAB — URINALYSIS, ROUTINE W REFLEX MICROSCOPIC
Bilirubin Urine: NEGATIVE
GLUCOSE, UA: NEGATIVE mg/dL
Hgb urine dipstick: NEGATIVE
Ketones, ur: 40 mg/dL — AB
LEUKOCYTES UA: NEGATIVE
NITRITE: NEGATIVE
PH: 6.5 (ref 5.0–8.0)
Protein, ur: NEGATIVE mg/dL
Specific Gravity, Urine: 1.014 (ref 1.005–1.030)

## 2015-12-30 LAB — BASIC METABOLIC PANEL
ANION GAP: 11 (ref 5–15)
BUN: 10 mg/dL (ref 6–20)
CHLORIDE: 91 mmol/L — AB (ref 101–111)
CO2: 25 mmol/L (ref 22–32)
Calcium: 9.3 mg/dL (ref 8.9–10.3)
Creatinine, Ser: 0.73 mg/dL (ref 0.61–1.24)
Glucose, Bld: 132 mg/dL — ABNORMAL HIGH (ref 65–99)
POTASSIUM: 3.6 mmol/L (ref 3.5–5.1)
SODIUM: 127 mmol/L — AB (ref 135–145)

## 2015-12-30 LAB — CBG MONITORING, ED: Glucose-Capillary: 120 mg/dL — ABNORMAL HIGH (ref 65–99)

## 2015-12-30 MED ORDER — GADOBENATE DIMEGLUMINE 529 MG/ML IV SOLN
20.0000 mL | Freq: Once | INTRAVENOUS | Status: AC | PRN
  Administered 2015-12-30: 18 mL via INTRAVENOUS

## 2015-12-30 NOTE — ED Notes (Signed)
Pt wheeled to wife's care.  Verbalized understanding of discharge instructions.

## 2015-12-30 NOTE — ED Notes (Signed)
Pt tolerated orthostatics.

## 2015-12-30 NOTE — ED Notes (Signed)
Patient transported to MRI 

## 2015-12-30 NOTE — ED Provider Notes (Signed)
CSN: [id number]     Arrival date & time 12/30/15  1349 History   First MD Initiated Contact with Patient 12/30/15 1722     Chief Complaint  Patient presents with  . Loss of Consciousness  . Inner Ear Dysfunction      (Consider location/radiation/quality/duration/timing/severity/associated sxs/prior Treatment) HPI   Samuel Fisher is a 70 y.o. male who presents for evaluation of dizziness, and syncope. He is having intermittent episodes of dizziness, characterized mainly by lightheadedness. He has also had some right ear pain associated with right cheek numbness. Because of that and some decreased hearing in the right ear. He was referred to ENT, who stated that he has a "inner ear problem". Today he had a new symptom associated with the same dizzy feeling, syncope. Initially he was bending over to get some ice out of a nice make her, when he "fell out". By this he means he fell to the floor and was unconscious. No one was with him at the time. Later, after walking to the bathroom, he was with his wife, and she noticed that he looked like he was dizzy, whereupon he nearly lost consciousness. Has wife was able to get him to "brace himself", to prevent falling, then got a chair for him to sit in and he became back to normal. He does not describe any vertigo. No current headache. No episodes of chest pain, shortness of breath, paresthesia, other than right cheek, back pain or abdominal pain. There are no other known modifying factors.   Past Medical History  Diagnosis Date  . Arthritis   . Allergy   . Hypertension   . BPH (benign prostatic hyperplasia) 2010  . Inner ear dysfunction    Past Surgical History  Procedure Laterality Date  . Tonsillectomy  1951  . Total hip arthroplasty      right-2002 and left-2004, 2010  . Joint replacement     Family History  Problem Relation Age of Onset  . Arthritis Other   . Heart disease Other   . Hypertension Other   . Stroke Other   . Early  death Other   . Mental illness Other   . Diabetes Neg Hx   . Hyperlipidemia Neg Hx   . Cancer Mother   . Cancer Father   . Hypertension Father    Social History  Substance Use Topics  . Smoking status: Former Smoker    Quit date: 06/28/1976  . Smokeless tobacco: Never Used  . Alcohol Use: 1.8 oz/week    3 Cans of beer per week    Review of Systems  All other systems reviewed and are negative.     Allergies  Bee venom  Home Medications   Prior to Admission medications   Medication Sig Start Date End Date Taking? Authorizing Provider  amoxicillin (AMOXIL) 500 MG capsule Take 2,000 mg by mouth once as needed (for dentist appointment).   Yes Historical Provider, MD  aspirin 325 MG tablet Take 325 mg by mouth every 4 (four) hours as needed for moderate pain or headache.    Yes Historical Provider, MD  EPINEPHrine 0.3 mg/0.3 mL IJ SOAJ injection Inject 0.3 mLs (0.3 mg total) into the muscle once. Patient taking differently: Inject 0.3 mg into the muscle once as needed (allegic).  11/19/14  Yes Thao P Le, DO  fluticasone (FLONASE) 50 MCG/ACT nasal spray Place 1-2 sprays into both nostrils daily as needed. allergies 12/03/15  Yes Historical Provider, MD  hydrochlorothiazide (HYDRODIURIL) 25 MG  tablet Take 1 tablet (25 mg total) by mouth daily. 10/16/12  Yes [name], MD  ibuprofen (ADVIL,MOTRIN) 200 MG tablet Take 200-800 mg by mouth every 6 (six) hours as needed for moderate pain.   Yes Historical Provider, MD  lisinopril (PRINIVIL,ZESTRIL) 40 MG tablet Take 1 tablet (40 mg total) by mouth daily. 10/16/12  Yes [name], MD  azithromycin (ZITHROMAX) 250 MG tablet Take 250-500 mg by mouth daily. Reported on 12/30/2015    Historical Provider, MD  neomycin-polymyxin-hydrocortisone (CORTISPORIN) otic solution Place 4 drops into the right ear 4 (four) times daily. For 10 days Patient not taking: Reported on 12/30/2015 11/10/15   Biagio Borg, MD   BP 149/83 mmHg  Pulse 77   Temp(Src) 99 F (37.2 C) (Oral)  Resp 16  SpO2 100% Physical Exam  Constitutional: He is oriented to person, place, and time. He appears well-developed and well-nourished.  HENT:  Head: Normocephalic and atraumatic.  Right Ear: External ear normal.  Left Ear: External ear normal.  Eyes: Conjunctivae and EOM are normal. Pupils are equal, round, and reactive to light.  Neck: Normal range of motion and phonation normal. Neck supple.  Cardiovascular: Normal rate, regular rhythm and normal heart sounds.   Pulmonary/Chest: Effort normal and breath sounds normal. He exhibits no bony tenderness.  Abdominal: Soft. There is no tenderness.  Musculoskeletal: Normal range of motion.  Neurological: He is alert and oriented to person, place, and time. No cranial nerve deficit or sensory deficit. He exhibits normal muscle tone. Coordination normal.  No dysarthria and aphasia or nystagmus. Negative test of skew, and head impulse testing. Mild discoordination, with finger to nose, right side. Deferred heel-to-shin testing, because of prior hip replacement surgery.  Skin: Skin is warm, dry and intact.  Psychiatric: He has a normal mood and affect. His behavior is normal. Judgment and thought content normal.  Nursing note and vitals reviewed.   ED Course  Procedures (including critical care time)   Medications  gadobenate dimeglumine (MULTIHANCE) injection 20 mL (18 mLs Intravenous Contrast Given 12/30/15 2156)    Patient Vitals for the past 24 hrs:  BP Temp Temp src Pulse Resp SpO2  12/30/15 1809 149/83 mmHg - - 77 16 100 %  12/30/15 1552 160/84 mmHg 99 F (37.2 C) Oral 95 14 99 %  12/30/15 1405 (!) 162/102 mmHg 98.2 F (36.8 C) Oral 86 16 100 %    22:50- case discussed with neurosurgery, Dr. Cyndy Freeze, who states that the patient should follow-up in the office for further evaluation and treatment  11:00 PM Reevaluation with update and discussion. After initial assessment and treatment, an updated  evaluation reveals No further complaints. Findings discussed with patient and wife, all questions answered. [name]       Labs Review Labs Reviewed  BASIC METABOLIC PANEL - Abnormal; Notable for the following:    Sodium 127 (*)    Chloride 91 (*)    Glucose, Bld 132 (*)    All other components within normal limits  CBC - Abnormal; Notable for the following:    RBC 3.87 (*)    Hemoglobin 12.7 (*)    HCT 35.9 (*)    All other components within normal limits  URINALYSIS, ROUTINE W REFLEX MICROSCOPIC (NOT AT [facility]) - Abnormal; Notable for the following:    Ketones, ur 40 (*)    All other components within normal limits  CBG MONITORING, ED - Abnormal; Notable for the following:    Glucose-Capillary 120 (*)  All other components within normal limits    Imaging Review Mr Jeri Cos Wo Contrast  12/30/2015  CLINICAL DATA:  Dizziness, RIGHT-sided hearing loss. Two falls today. History of hypertension, LEFT hearing loss and inner ear dysfunction. EXAM: MRI HEAD WITHOUT AND WITH CONTRAST TECHNIQUE: Multiplanar, multiecho pulse sequences of the brain and surrounding structures were obtained without and with intravenous contrast. CONTRAST:  22mL MULTIHANCE GADOBENATE DIMEGLUMINE 529 MG/ML IV SOLN COMPARISON:  None. FINDINGS: INTRACRANIAL CONTENTS: 2.5 x 4.5 cm (transverse by AP) RIGHT cerebellar pontine angle cystic mass with enhancing septations, and thin mural enhancement extends into the RIGHT cerebellar pontine angle (bright T2 FLAIR). Mass effect on the subjacent brachium pontis without parenchymal edema or enhancement to suggest invasion. A second cystic 12 x 19 mm mass in RIGHT Meckel's cave with low T2 FLAIR signal. Moderate ventriculomegaly on the basis of global parenchymal brain volume loss. No reduced diffusion to suggest acute ischemia or hypercellular tumor. Linear flow voids within the primary CP angle mass. No susceptibility artifact to suggest hemorrhage. No abnormal parenchymal  enhancement. Mild white matter changes most compatible chronic small vessel ischemic disease, less than expected for age. No abnormal extra-axial fluid collections. Normal major intracranial vascular flow voids present at skull base. ORBITS: The included ocular globes and orbital contents are non-suspicious. SINUSES: Trace paranasal sinus mucosal thickening. Trace LEFT mastoid effusion. SKULL/SOFT TISSUES: No abnormal sellar expansion. No suspicious calvarial bone marrow signal. Craniocervical junction maintained. 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 12 x 19 mm cystic mass in RIGHT Meckel's cave with slightly different imaging characteristics suggesting obstruction and accumulation of CSF, possible arachnoid cyst versus second schwannoma. Moderate global brain atrophy, mildly advanced for age. Mild chronic small vessel ischemic disease. Electronically Signed   By: Elon Alas M.D.   On: 12/30/2015 22:25   I have personally reviewed and evaluated these images and lab results as part of my medical decision-making.   EKG Interpretation   Date/Time:  Wednesday December 30 2015 14:42:17 EDT Ventricular Rate:  90 PR Interval:    QRS Duration: 134 QT Interval:  451 QTC Calculation: 549 R Axis:   -152 Text Interpretation:  Sinus rhythm Nonspecific intraventricular conduction  delay Inferior infarct, age indeterminate Anterior infarct, old No old  tracing to compare Confirmed by [facility]  MD, Aliha Diedrich [phone]) on 12/30/2015  5:32:52 PM      MDM   Final diagnoses:  Dysmetria  Schwannoma    Nonspecific near syncope, with reported dizziness. Symptoms likely related to schwannoma, likely vestibular based right-sided. Nonspecific midline cerebellar abnormality, possibly schwannoma versus CSF collection. No persistent headaches, evidence for meningitis or metabolic instability.   Nursing  Notes Reviewed/ Care Coordinated Applicable Imaging Reviewed Interpretation of Laboratory Data incorporated into ED treatment  The patient appears reasonably screened and/or stabilized for discharge and I doubt any other medical condition or other [facility] requiring further screening, evaluation, or treatment in the ED at this time prior to discharge.  Plan: Home Medications- usual; Home Treatments- rest; return here if the recommended treatment, does not improve the symptoms; Recommended follow up- PCP prn     Daleen Bo, MD 12/30/15 2311

## 2015-12-30 NOTE — ED Notes (Signed)
Pt c/o inner ear dysfunctional x 2 months and syncope x 2 episodes and R shoulder pain r/t fall today.  Pain score 4/10.  Sts "numbness and tingling" on R side face.  Pt reports that he is followed by Lucia Gaskins MD w/ ENT.  Sts "he told me that if it got worse, I needed a MRI.  His office told me that he is in surgery today."

## 2016-01-05 DIAGNOSIS — Z7982 Long term (current) use of aspirin: Secondary | ICD-10-CM | POA: Diagnosis not present

## 2016-01-05 DIAGNOSIS — H9041 Sensorineural hearing loss, unilateral, right ear, with unrestricted hearing on the contralateral side: Secondary | ICD-10-CM | POA: Diagnosis not present

## 2016-01-05 DIAGNOSIS — D497 Neoplasm of unspecified behavior of endocrine glands and other parts of nervous system: Secondary | ICD-10-CM | POA: Diagnosis not present

## 2016-01-05 DIAGNOSIS — Z79899 Other long term (current) drug therapy: Secondary | ICD-10-CM | POA: Diagnosis not present

## 2016-01-05 DIAGNOSIS — D333 Benign neoplasm of cranial nerves: Secondary | ICD-10-CM | POA: Diagnosis not present

## 2016-01-05 DIAGNOSIS — I10 Essential (primary) hypertension: Secondary | ICD-10-CM | POA: Diagnosis not present

## 2016-01-05 DIAGNOSIS — Z9181 History of falling: Secondary | ICD-10-CM | POA: Diagnosis not present

## 2016-01-15 DIAGNOSIS — D333 Benign neoplasm of cranial nerves: Secondary | ICD-10-CM | POA: Diagnosis not present

## 2016-01-20 DIAGNOSIS — H9191 Unspecified hearing loss, right ear: Secondary | ICD-10-CM | POA: Diagnosis not present

## 2016-01-20 DIAGNOSIS — H16211 Exposure keratoconjunctivitis, right eye: Secondary | ICD-10-CM | POA: Diagnosis not present

## 2016-01-20 DIAGNOSIS — J309 Allergic rhinitis, unspecified: Secondary | ICD-10-CM | POA: Diagnosis not present

## 2016-01-20 DIAGNOSIS — I10 Essential (primary) hypertension: Secondary | ICD-10-CM | POA: Diagnosis not present

## 2016-01-20 DIAGNOSIS — G510 Bell's palsy: Secondary | ICD-10-CM | POA: Diagnosis not present

## 2016-01-20 DIAGNOSIS — R739 Hyperglycemia, unspecified: Secondary | ICD-10-CM | POA: Diagnosis not present

## 2016-01-20 DIAGNOSIS — E871 Hypo-osmolality and hyponatremia: Secondary | ICD-10-CM | POA: Diagnosis not present

## 2016-01-20 DIAGNOSIS — D497 Neoplasm of unspecified behavior of endocrine glands and other parts of nervous system: Secondary | ICD-10-CM | POA: Diagnosis not present

## 2016-01-20 DIAGNOSIS — Z96643 Presence of artificial hip joint, bilateral: Secondary | ICD-10-CM | POA: Diagnosis not present

## 2016-01-20 DIAGNOSIS — D333 Benign neoplasm of cranial nerves: Secondary | ICD-10-CM | POA: Diagnosis not present

## 2016-01-20 DIAGNOSIS — M1990 Unspecified osteoarthritis, unspecified site: Secondary | ICD-10-CM | POA: Diagnosis not present

## 2016-01-20 DIAGNOSIS — R29810 Facial weakness: Secondary | ICD-10-CM | POA: Diagnosis not present

## 2016-01-20 DIAGNOSIS — H02203 Unspecified lagophthalmos right eye, unspecified eyelid: Secondary | ICD-10-CM | POA: Diagnosis not present

## 2016-01-20 DIAGNOSIS — D72829 Elevated white blood cell count, unspecified: Secondary | ICD-10-CM | POA: Diagnosis not present

## 2016-01-20 DIAGNOSIS — M1991 Primary osteoarthritis, unspecified site: Secondary | ICD-10-CM | POA: Diagnosis not present

## 2016-01-22 DIAGNOSIS — E871 Hypo-osmolality and hyponatremia: Secondary | ICD-10-CM | POA: Diagnosis not present

## 2016-01-22 DIAGNOSIS — M1990 Unspecified osteoarthritis, unspecified site: Secondary | ICD-10-CM | POA: Diagnosis not present

## 2016-01-22 DIAGNOSIS — H16211 Exposure keratoconjunctivitis, right eye: Secondary | ICD-10-CM | POA: Diagnosis not present

## 2016-01-22 DIAGNOSIS — D333 Benign neoplasm of cranial nerves: Secondary | ICD-10-CM | POA: Diagnosis not present

## 2016-01-22 DIAGNOSIS — I10 Essential (primary) hypertension: Secondary | ICD-10-CM | POA: Diagnosis not present

## 2016-01-22 DIAGNOSIS — G510 Bell's palsy: Secondary | ICD-10-CM | POA: Diagnosis not present

## 2016-01-23 DIAGNOSIS — H02203 Unspecified lagophthalmos right eye, unspecified eyelid: Secondary | ICD-10-CM | POA: Diagnosis not present

## 2016-01-23 DIAGNOSIS — G510 Bell's palsy: Secondary | ICD-10-CM | POA: Diagnosis not present

## 2016-01-26 DIAGNOSIS — D333 Benign neoplasm of cranial nerves: Secondary | ICD-10-CM | POA: Diagnosis not present

## 2016-01-26 DIAGNOSIS — R270 Ataxia, unspecified: Secondary | ICD-10-CM | POA: Diagnosis not present

## 2016-01-26 DIAGNOSIS — Z7982 Long term (current) use of aspirin: Secondary | ICD-10-CM | POA: Diagnosis not present

## 2016-01-26 DIAGNOSIS — M6281 Muscle weakness (generalized): Secondary | ICD-10-CM | POA: Diagnosis not present

## 2016-01-26 DIAGNOSIS — Z483 Aftercare following surgery for neoplasm: Secondary | ICD-10-CM | POA: Diagnosis not present

## 2016-01-26 DIAGNOSIS — Z96653 Presence of artificial knee joint, bilateral: Secondary | ICD-10-CM | POA: Diagnosis not present

## 2016-01-26 DIAGNOSIS — I10 Essential (primary) hypertension: Secondary | ICD-10-CM | POA: Diagnosis not present

## 2016-01-26 DIAGNOSIS — H9191 Unspecified hearing loss, right ear: Secondary | ICD-10-CM | POA: Diagnosis not present

## 2016-02-02 DIAGNOSIS — Z48811 Encounter for surgical aftercare following surgery on the nervous system: Secondary | ICD-10-CM | POA: Diagnosis not present

## 2016-02-03 DIAGNOSIS — H04123 Dry eye syndrome of bilateral lacrimal glands: Secondary | ICD-10-CM | POA: Diagnosis not present

## 2016-02-03 DIAGNOSIS — Z7982 Long term (current) use of aspirin: Secondary | ICD-10-CM | POA: Diagnosis not present

## 2016-02-03 DIAGNOSIS — H02203 Unspecified lagophthalmos right eye, unspecified eyelid: Secondary | ICD-10-CM | POA: Diagnosis not present

## 2016-02-03 DIAGNOSIS — G510 Bell's palsy: Secondary | ICD-10-CM | POA: Diagnosis not present

## 2016-02-03 DIAGNOSIS — I10 Essential (primary) hypertension: Secondary | ICD-10-CM | POA: Diagnosis not present

## 2016-02-03 DIAGNOSIS — H02831 Dermatochalasis of right upper eyelid: Secondary | ICD-10-CM | POA: Diagnosis not present

## 2016-02-03 DIAGNOSIS — Z79899 Other long term (current) drug therapy: Secondary | ICD-10-CM | POA: Diagnosis not present

## 2016-02-03 DIAGNOSIS — H02833 Dermatochalasis of right eye, unspecified eyelid: Secondary | ICD-10-CM | POA: Diagnosis not present

## 2016-02-03 DIAGNOSIS — H02836 Dermatochalasis of left eye, unspecified eyelid: Secondary | ICD-10-CM | POA: Diagnosis not present

## 2016-02-03 DIAGNOSIS — H02834 Dermatochalasis of left upper eyelid: Secondary | ICD-10-CM | POA: Diagnosis not present

## 2016-02-03 DIAGNOSIS — Z9889 Other specified postprocedural states: Secondary | ICD-10-CM | POA: Diagnosis not present

## 2016-02-17 DIAGNOSIS — H02831 Dermatochalasis of right upper eyelid: Secondary | ICD-10-CM | POA: Diagnosis not present

## 2016-02-17 DIAGNOSIS — H02834 Dermatochalasis of left upper eyelid: Secondary | ICD-10-CM | POA: Diagnosis not present

## 2016-02-17 DIAGNOSIS — G510 Bell's palsy: Secondary | ICD-10-CM | POA: Diagnosis not present

## 2016-02-17 DIAGNOSIS — H5989 Other postprocedural complications and disorders of eye and adnexa, not elsewhere classified: Secondary | ICD-10-CM | POA: Diagnosis not present

## 2016-02-17 DIAGNOSIS — H04123 Dry eye syndrome of bilateral lacrimal glands: Secondary | ICD-10-CM | POA: Diagnosis not present

## 2016-02-23 DIAGNOSIS — Z48811 Encounter for surgical aftercare following surgery on the nervous system: Secondary | ICD-10-CM | POA: Diagnosis not present

## 2016-02-23 DIAGNOSIS — Z86018 Personal history of other benign neoplasm: Secondary | ICD-10-CM | POA: Diagnosis not present

## 2016-02-29 DIAGNOSIS — G510 Bell's palsy: Secondary | ICD-10-CM | POA: Insufficient documentation

## 2016-03-03 DIAGNOSIS — H02834 Dermatochalasis of left upper eyelid: Secondary | ICD-10-CM | POA: Diagnosis not present

## 2016-03-03 DIAGNOSIS — H02203 Unspecified lagophthalmos right eye, unspecified eyelid: Secondary | ICD-10-CM | POA: Diagnosis not present

## 2016-03-03 DIAGNOSIS — I10 Essential (primary) hypertension: Secondary | ICD-10-CM | POA: Diagnosis not present

## 2016-03-03 DIAGNOSIS — G510 Bell's palsy: Secondary | ICD-10-CM | POA: Diagnosis not present

## 2016-03-03 DIAGNOSIS — H02051 Trichiasis without entropian right upper eyelid: Secondary | ICD-10-CM | POA: Diagnosis not present

## 2016-03-03 DIAGNOSIS — H04123 Dry eye syndrome of bilateral lacrimal glands: Secondary | ICD-10-CM | POA: Diagnosis not present

## 2016-03-03 DIAGNOSIS — H02831 Dermatochalasis of right upper eyelid: Secondary | ICD-10-CM | POA: Diagnosis not present

## 2016-03-03 DIAGNOSIS — Z9889 Other specified postprocedural states: Secondary | ICD-10-CM | POA: Diagnosis not present

## 2016-03-15 DIAGNOSIS — G510 Bell's palsy: Secondary | ICD-10-CM | POA: Diagnosis not present

## 2016-03-15 DIAGNOSIS — H02531 Eyelid retraction right upper eyelid: Secondary | ICD-10-CM | POA: Diagnosis not present

## 2016-03-15 DIAGNOSIS — H16211 Exposure keratoconjunctivitis, right eye: Secondary | ICD-10-CM | POA: Diagnosis not present

## 2016-03-15 DIAGNOSIS — H02532 Eyelid retraction right lower eyelid: Secondary | ICD-10-CM | POA: Diagnosis not present

## 2016-03-15 DIAGNOSIS — I10 Essential (primary) hypertension: Secondary | ICD-10-CM | POA: Diagnosis not present

## 2016-03-22 DIAGNOSIS — L908 Other atrophic disorders of skin: Secondary | ICD-10-CM | POA: Diagnosis not present

## 2016-03-22 DIAGNOSIS — H02401 Unspecified ptosis of right eyelid: Secondary | ICD-10-CM | POA: Diagnosis not present

## 2016-03-22 DIAGNOSIS — Z9889 Other specified postprocedural states: Secondary | ICD-10-CM | POA: Diagnosis not present

## 2016-03-22 DIAGNOSIS — H2513 Age-related nuclear cataract, bilateral: Secondary | ICD-10-CM | POA: Diagnosis not present

## 2016-03-22 DIAGNOSIS — Z79899 Other long term (current) drug therapy: Secondary | ICD-10-CM | POA: Diagnosis not present

## 2016-03-22 DIAGNOSIS — G510 Bell's palsy: Secondary | ICD-10-CM | POA: Diagnosis not present

## 2016-03-30 LAB — PSA: PSA: 5

## 2016-04-27 LAB — PSA: PSA: 5.66

## 2016-04-28 DIAGNOSIS — Z9889 Other specified postprocedural states: Secondary | ICD-10-CM

## 2016-04-28 DIAGNOSIS — Z86018 Personal history of other benign neoplasm: Secondary | ICD-10-CM | POA: Insufficient documentation

## 2016-04-28 DIAGNOSIS — D333 Benign neoplasm of cranial nerves: Secondary | ICD-10-CM | POA: Diagnosis not present

## 2016-05-23 DIAGNOSIS — R94131 Abnormal electromyogram [EMG]: Secondary | ICD-10-CM | POA: Diagnosis not present

## 2016-05-23 DIAGNOSIS — Z86018 Personal history of other benign neoplasm: Secondary | ICD-10-CM | POA: Diagnosis not present

## 2016-05-23 DIAGNOSIS — G510 Bell's palsy: Secondary | ICD-10-CM | POA: Diagnosis not present

## 2016-05-23 DIAGNOSIS — G518 Other disorders of facial nerve: Secondary | ICD-10-CM | POA: Diagnosis not present

## 2016-05-24 DIAGNOSIS — G510 Bell's palsy: Secondary | ICD-10-CM | POA: Diagnosis not present

## 2016-05-24 DIAGNOSIS — Z86018 Personal history of other benign neoplasm: Secondary | ICD-10-CM | POA: Diagnosis not present

## 2016-05-24 DIAGNOSIS — Z9889 Other specified postprocedural states: Secondary | ICD-10-CM | POA: Diagnosis not present

## 2016-05-24 DIAGNOSIS — D333 Benign neoplasm of cranial nerves: Secondary | ICD-10-CM | POA: Diagnosis not present

## 2016-06-01 DIAGNOSIS — G510 Bell's palsy: Secondary | ICD-10-CM | POA: Diagnosis not present

## 2016-06-07 DIAGNOSIS — G510 Bell's palsy: Secondary | ICD-10-CM | POA: Diagnosis not present

## 2016-07-07 ENCOUNTER — Encounter: Payer: Medicare Other | Admitting: Internal Medicine

## 2016-07-11 DIAGNOSIS — G510 Bell's palsy: Secondary | ICD-10-CM | POA: Diagnosis not present

## 2016-07-11 DIAGNOSIS — Z9889 Other specified postprocedural states: Secondary | ICD-10-CM | POA: Diagnosis not present

## 2016-07-11 DIAGNOSIS — Z87891 Personal history of nicotine dependence: Secondary | ICD-10-CM | POA: Diagnosis not present

## 2016-07-11 DIAGNOSIS — Z79899 Other long term (current) drug therapy: Secondary | ICD-10-CM | POA: Diagnosis not present

## 2016-07-14 ENCOUNTER — Encounter: Payer: Self-pay | Admitting: Internal Medicine

## 2016-07-14 ENCOUNTER — Ambulatory Visit (INDEPENDENT_AMBULATORY_CARE_PROVIDER_SITE_OTHER): Payer: Medicare Other | Admitting: Internal Medicine

## 2016-07-14 ENCOUNTER — Other Ambulatory Visit (INDEPENDENT_AMBULATORY_CARE_PROVIDER_SITE_OTHER): Payer: Medicare Other

## 2016-07-14 VITALS — BP 120/70 | HR 87 | Temp 98.2°F | Resp 16 | Ht 72.0 in | Wt 182.1 lb

## 2016-07-14 DIAGNOSIS — D513 Other dietary vitamin B12 deficiency anemia: Secondary | ICD-10-CM

## 2016-07-14 DIAGNOSIS — Z23 Encounter for immunization: Secondary | ICD-10-CM | POA: Diagnosis not present

## 2016-07-14 DIAGNOSIS — I10 Essential (primary) hypertension: Secondary | ICD-10-CM

## 2016-07-14 DIAGNOSIS — E7800 Pure hypercholesterolemia, unspecified: Secondary | ICD-10-CM | POA: Diagnosis not present

## 2016-07-14 DIAGNOSIS — R972 Elevated prostate specific antigen [PSA]: Secondary | ICD-10-CM

## 2016-07-14 DIAGNOSIS — C479 Malignant neoplasm of peripheral nerves and autonomic nervous system, unspecified: Secondary | ICD-10-CM | POA: Insufficient documentation

## 2016-07-14 DIAGNOSIS — D539 Nutritional anemia, unspecified: Secondary | ICD-10-CM | POA: Diagnosis not present

## 2016-07-14 DIAGNOSIS — Z0000 Encounter for general adult medical examination without abnormal findings: Secondary | ICD-10-CM | POA: Diagnosis not present

## 2016-07-14 DIAGNOSIS — D3610 Benign neoplasm of peripheral nerves and autonomic nervous system, unspecified: Secondary | ICD-10-CM

## 2016-07-14 LAB — LIPID PANEL
CHOLESTEROL: 155 mg/dL (ref 0–200)
HDL: 73.3 mg/dL (ref >39.00)
LDL Cholesterol: 73 mg/dL (ref 0–99)
NONHDL: 81.69
Total CHOL/HDL Ratio: 2
Triglycerides: 44 mg/dL (ref 0.0–149.0)
VLDL: 8.8 mg/dL (ref 0.0–40.0)

## 2016-07-14 LAB — TSH: TSH: 1.76 u[IU]/mL (ref 0.35–4.50)

## 2016-07-14 LAB — CBC WITH DIFFERENTIAL/PLATELET
BASOS ABS: 0 10*3/uL (ref 0.0–0.1)
Basophils Relative: 0.5 % (ref 0.0–3.0)
EOS ABS: 0.1 10*3/uL (ref 0.0–0.7)
Eosinophils Relative: 2.3 % (ref 0.0–5.0)
HCT: 35.3 % — ABNORMAL LOW (ref 39.0–52.0)
Hemoglobin: 12.6 g/dL — ABNORMAL LOW (ref 13.0–17.0)
LYMPHS ABS: 1.5 10*3/uL (ref 0.7–4.0)
Lymphocytes Relative: 22.7 % (ref 12.0–46.0)
MCHC: 35.6 g/dL (ref 30.0–36.0)
MCV: 92.2 fl (ref 78.0–100.0)
MONO ABS: 0.7 10*3/uL (ref 0.1–1.0)
Monocytes Relative: 10.5 % (ref 3.0–12.0)
NEUTROS PCT: 64 % (ref 43.0–77.0)
Neutro Abs: 4.1 10*3/uL (ref 1.4–7.7)
Platelets: 223 10*3/uL (ref 150.0–400.0)
RBC: 3.83 Mil/uL — AB (ref 4.22–5.81)
RDW: 13 % (ref 11.5–15.5)
WBC: 6.4 10*3/uL (ref 4.0–10.5)

## 2016-07-14 LAB — COMPREHENSIVE METABOLIC PANEL
ALBUMIN: 4.5 g/dL (ref 3.5–5.2)
ALK PHOS: 47 U/L (ref 39–117)
ALT: 15 U/L (ref 0–53)
AST: 21 U/L (ref 0–37)
BILIRUBIN TOTAL: 0.8 mg/dL (ref 0.2–1.2)
BUN: 11 mg/dL (ref 6–23)
CO2: 29 mEq/L (ref 19–32)
Calcium: 9.5 mg/dL (ref 8.4–10.5)
Chloride: 95 mEq/L — ABNORMAL LOW (ref 96–112)
Creatinine, Ser: 0.83 mg/dL (ref 0.40–1.50)
GFR: 97.68 mL/min (ref >60.00)
GLUCOSE: 101 mg/dL — AB (ref 70–99)
Potassium: 3.7 mEq/L (ref 3.5–5.1)
SODIUM: 130 meq/L — AB (ref 135–145)
TOTAL PROTEIN: 6.8 g/dL (ref 6.0–8.3)

## 2016-07-14 LAB — IBC PANEL
Iron: 182 ug/dL — ABNORMAL HIGH (ref 42–165)
SATURATION RATIOS: 53.3 % — AB (ref 20.0–50.0)
TRANSFERRIN: 244 mg/dL (ref 212.0–360.0)

## 2016-07-14 LAB — VITAMIN B12: Vitamin B-12: 169 pg/mL — ABNORMAL LOW (ref 211–911)

## 2016-07-14 LAB — FOLATE: Folate: 13.2 ng/mL (ref >5.9)

## 2016-07-14 LAB — FERRITIN: Ferritin: 149.1 ng/mL (ref 22.0–322.0)

## 2016-07-14 NOTE — Progress Notes (Signed)
Pre visit review using our clinic review tool, if applicable. No additional management support is needed unless otherwise documented below in the visit note. 

## 2016-07-14 NOTE — Progress Notes (Addendum)
Subjective:  Patient ID: TANE CHOY, male    DOB: 02-11-1946  Age: 71 y.o. MRN: [id number]  CC: Annual Exam; Hypertension; and Hyperlipidemia   HPI [place] AMLIN presents for an AWV/CPX.  -He is status post excision of a benign right schwannoma approximately 9 months ago. The surgery was extensive and resulted in right facial paralysis. He has lost the hearing in his right ear but tells me that his balance has improved. -He has a history of anemia but denies shortness of breath, fatigue, numbness, or tingling. -He tells me his blood pressures been well controlled and he denies any recent episodes of chest pain, shortness of breath, palpitations, edema, or fatigue. -He is being followed by urology for an elevated PSA and tells me that he saw his urologist about 3 months ago and his PSA was down to 5.  Past Medical History:  Diagnosis Date  . Allergy   . Arthritis   . BPH (benign prostatic hyperplasia) 2010  . Hypertension   . Inner ear dysfunction    Past Surgical History:  Procedure Laterality Date  . JOINT REPLACEMENT    . TONSILLECTOMY  1951  . TOTAL HIP ARTHROPLASTY     right-2002 and left-2004, 2010    reports that he quit smoking about 40 years ago. He has never used smokeless tobacco. He reports that he drinks about 1.8 oz of alcohol per week . He reports that he does not use drugs. family history includes Arthritis in his other; Cancer in his father and mother; Early death in his other; Heart disease in his other; Hypertension in his father and other; Mental illness in his other; Stroke in his other. Allergies  Allergen Reactions  . Bee Venom Anaphylaxis    Allergic to Bee Sting -unknown reaction     Past Medical History:  Diagnosis Date  . Allergy   . Arthritis   . BPH (benign prostatic hyperplasia) 2010  . Hypertension   . Inner ear dysfunction    Past Surgical History:  Procedure Laterality Date  . JOINT REPLACEMENT    . TONSILLECTOMY  1951    . TOTAL HIP ARTHROPLASTY     right-2002 and left-2004, 2010    reports that he quit smoking about 40 years ago. He has never used smokeless tobacco. He reports that he drinks about 1.8 oz of alcohol per week . He reports that he does not use drugs. family history includes Arthritis in his other; Cancer in his father and mother; Early death in his other; Heart disease in his other; Hypertension in his father and other; Mental illness in his other; Stroke in his other. Allergies  Allergen Reactions  . Bee Venom Anaphylaxis    Allergic to Bee Sting -unknown reaction    Outpatient Medications Prior to Visit  Medication Sig Dispense Refill  . aspirin 325 MG tablet Take 325 mg by mouth every 4 (four) hours as needed for moderate pain or headache.     Marland Kitchen EPINEPHrine 0.3 mg/0.3 mL IJ SOAJ injection Inject 0.3 mLs (0.3 mg total) into the muscle once. (Patient taking differently: Inject 0.3 mg into the muscle once as needed (allegic). ) 2 Device 0  . fluticasone (FLONASE) 50 MCG/ACT nasal spray Place 1-2 sprays into both nostrils daily as needed. allergies  2  . hydrochlorothiazide (HYDRODIURIL) 25 MG tablet Take 1 tablet (25 mg total) by mouth daily. 90 tablet 3  . ibuprofen (ADVIL,MOTRIN) 200 MG tablet Take 200-800 mg by mouth every 6 (  six) hours as needed for moderate pain.    Marland Kitchen lisinopril (PRINIVIL,ZESTRIL) 40 MG tablet Take 1 tablet (40 mg total) by mouth daily. 90 tablet 3  . amoxicillin (AMOXIL) 500 MG capsule Take 2,000 mg by mouth once as needed (for dentist appointment).    Marland Kitchen azithromycin (ZITHROMAX) 250 MG tablet Take 250-500 mg by mouth daily. Reported on 12/30/2015    . neomycin-polymyxin-hydrocortisone (CORTISPORIN) otic solution Place 4 drops into the right ear 4 (four) times daily. For 10 days (Patient not taking: Reported on 12/30/2015) 10 mL 0   No facility-administered medications prior to visit.     ROS Review of Systems  Constitutional: Negative for chills, diaphoresis, fatigue  and unexpected weight change.  HENT: Negative.  Negative for facial swelling, sinus pressure, trouble swallowing and voice change.   Eyes: Negative.  Negative for visual disturbance.  Respiratory: Negative for cough, chest tightness, shortness of breath and wheezing.   Cardiovascular: Negative for chest pain, palpitations and leg swelling.  Gastrointestinal: Negative for abdominal pain, blood in stool, constipation, diarrhea, nausea and vomiting.  Endocrine: Negative.   Genitourinary: Negative.  Negative for difficulty urinating, dysuria, flank pain, penile swelling, scrotal swelling, testicular pain and urgency.  Musculoskeletal: Negative.  Negative for back pain and neck pain.  Skin: Negative.  Negative for color change and rash.  Allergic/Immunologic: Negative.   Neurological: Positive for facial asymmetry. Negative for dizziness, speech difficulty, weakness and numbness.  Hematological: Negative for adenopathy. Does not bruise/bleed easily.  Psychiatric/Behavioral: Negative.     Objective:  BP 120/70 (BP Location: Left Arm, Patient Position: Sitting, Cuff Size: Normal)   Pulse 87   Temp 98.2 F (36.8 C) (Oral)   Resp 16   Ht 6' (1.829 m)   Wt 182 lb 1.9 oz (82.6 kg)   SpO2 98%   BMI 24.70 kg/m   BP Readings from Last 3 Encounters:  07/14/16 120/70  12/30/15 139/96  11/10/15 132/70    Wt Readings from Last 3 Encounters:  07/14/16 182 lb 1.9 oz (82.6 kg)  12/30/15 183 lb (83 kg)  11/10/15 183 lb (83 kg)    Physical Exam  Constitutional: He is oriented to person, place, and time. No distress.  HENT:  Mouth/Throat: Oropharynx is clear and moist. No oropharyngeal exudate.  Eyes: Conjunctivae are normal. Right eye exhibits no discharge. Left eye exhibits no discharge. No scleral icterus.  Neck: Normal range of motion. Neck supple. No JVD present. No tracheal deviation present. No thyromegaly present.  Cardiovascular: Normal rate, regular rhythm, normal heart sounds and  intact distal pulses.  Exam reveals no gallop and no friction rub.   No murmur heard. Pulmonary/Chest: Effort normal. No stridor. No respiratory distress. He has no wheezes. He has no rales. He exhibits no tenderness.  Abdominal: Soft. Bowel sounds are normal. He exhibits no distension and no mass. There is no tenderness. There is no rebound and no guarding.  Musculoskeletal: Normal range of motion. He exhibits no edema, tenderness or deformity.  Lymphadenopathy:    He has no cervical adenopathy.  Neurological: He is oriented to person, place, and time. He displays no atrophy, no tremor and normal reflexes. A cranial nerve deficit is present. He exhibits normal muscle tone. He displays a negative Romberg sign. He displays no seizure activity. Coordination and gait normal.  Dense right facial paresis  Skin: Skin is warm and dry. No rash noted. He is not diaphoretic. No erythema. No pallor.  Vitals reviewed.   Lab Results  Component  Value Date   WBC 6.4 07/14/2016   HGB 12.6 (L) 07/14/2016   HCT 35.3 (L) 07/14/2016   PLT 223.0 07/14/2016   GLUCOSE 101 (H) 07/14/2016   CHOL 155 07/14/2016   TRIG 44.0 07/14/2016   HDL 73.30 07/14/2016   LDLCALC 73 07/14/2016   ALT 15 07/14/2016   AST 21 07/14/2016   NA 130 (L) 07/14/2016   K 3.7 07/14/2016   CL 95 (L) 07/14/2016   CREATININE 0.83 07/14/2016   BUN 11 07/14/2016   CO2 29 07/14/2016   TSH 1.76 07/14/2016   PSA 5.0 03/30/2016    Mr Brain W Wo Contrast  Result Date: 12/30/2015 CLINICAL DATA:  Dizziness, RIGHT-sided hearing loss. Two falls today. History of hypertension, LEFT hearing loss and inner ear dysfunction. EXAM: MRI HEAD WITHOUT AND WITH CONTRAST TECHNIQUE: Multiplanar, multiecho pulse sequences of the brain and surrounding structures were obtained without and with intravenous contrast. CONTRAST:  62mL MULTIHANCE GADOBENATE DIMEGLUMINE 529 MG/ML IV SOLN COMPARISON:  None. FINDINGS: INTRACRANIAL CONTENTS: 2.5 x 4.5 cm (transverse  by AP) RIGHT cerebellar pontine angle cystic mass with enhancing septations, and thin mural enhancement extends into the RIGHT cerebellar pontine angle (bright T2 FLAIR). Mass effect on the subjacent brachium pontis without parenchymal edema or enhancement to suggest invasion. A second cystic 12 x 19 mm mass in RIGHT Meckel's cave with low T2 FLAIR signal. Moderate ventriculomegaly on the basis of global parenchymal brain volume loss. No reduced diffusion to suggest acute ischemia or hypercellular tumor. Linear flow voids within the primary CP angle mass. No susceptibility artifact to suggest hemorrhage. No abnormal parenchymal enhancement. Mild white matter changes most compatible chronic small vessel ischemic disease, less than expected for age. No abnormal extra-axial fluid collections. Normal major intracranial vascular flow voids present at skull base. ORBITS: The included ocular globes and orbital contents are non-suspicious. SINUSES: Trace paranasal sinus mucosal thickening. Trace LEFT mastoid effusion. SKULL/SOFT TISSUES: No abnormal sellar expansion. No suspicious calvarial bone marrow signal. Craniocervical junction maintained. 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 12 x 19 mm cystic mass in RIGHT Meckel's cave with slightly different imaging characteristics suggesting obstruction and accumulation of CSF, possible arachnoid cyst versus second schwannoma. Moderate global brain atrophy, mildly advanced for age. Mild chronic small vessel ischemic disease. Electronically Signed   By: Elon Alas M.D.   On: 12/30/2015 22:25    Assessment & Plan:   Camry was seen today for annual exam, hypertension and hyperlipidemia.  Diagnoses and all orders for this visit:  PSA elevation- will continue follow-up as directed by urology  Pure hypercholesterolemia- his Framingham risk score is 8%  but he does not want to start a statin for cardiovascular risk reduction -     Lipid panel; Future -     TSH; Future  Essential hypertension, benign- his blood pressures well controlled, he has mild/stable hyponatremia due to the thiazide diuretic therapy, he is asymptomatic with respect to this, his systolic blood pressure is only 120 so I have discontinued the thiazide diuretic. -     Comprehensive metabolic panel; Future  Deficiency anemia- his labs revealed B12 deficiency -     CBC with Differential/Platelet; Future -     IBC panel; Future -     Vitamin B12; Future -     Ferritin; Future -     Folate; Future  Malignant schwannoma [facility])  Routine general medical examination  at a health care facility  Benign schwannoma  Need for prophylactic vaccination and inoculation against influenza -     Flu vaccine HIGH DOSE PF (Fluzone High dose)  Need for prophylactic vaccination with combined diphtheria-tetanus-pertussis (DTP) vaccine -     Tdap vaccine greater than or equal to 7yo IM  Other dietary vitamin B12 deficiency anemia- I've asked him to start parenteral B12 replacement therapy   I have discontinued Mr. Squillace's neomycin-polymyxin-hydrocortisone and azithromycin. I am also having him maintain his aspirin, lisinopril, hydrochlorothiazide, EPINEPHrine, amoxicillin, fluticasone, ibuprofen, and diazepam.  Meds ordered this encounter  Medications  . diazepam (VALIUM) 5 MG tablet    Sig: Take 5 mg by mouth.   See AVS for instructions about healthy living and anticipatory guidance.  Follow-up: Return in about 6 months (around 01/11/2017).  Scarlette Calico, MD

## 2016-07-14 NOTE — Patient Instructions (Signed)

## 2016-07-15 ENCOUNTER — Encounter: Payer: Self-pay | Admitting: Internal Medicine

## 2016-07-15 DIAGNOSIS — D519 Vitamin B12 deficiency anemia, unspecified: Secondary | ICD-10-CM | POA: Insufficient documentation

## 2016-07-17 NOTE — Assessment & Plan Note (Signed)

## 2016-07-19 DIAGNOSIS — H02051 Trichiasis without entropian right upper eyelid: Secondary | ICD-10-CM | POA: Diagnosis not present

## 2016-07-19 DIAGNOSIS — H02021 Mechanical entropion of right upper eyelid: Secondary | ICD-10-CM | POA: Diagnosis not present

## 2016-07-19 DIAGNOSIS — H2513 Age-related nuclear cataract, bilateral: Secondary | ICD-10-CM | POA: Diagnosis not present

## 2016-07-19 DIAGNOSIS — Z87891 Personal history of nicotine dependence: Secondary | ICD-10-CM | POA: Diagnosis not present

## 2016-07-19 DIAGNOSIS — H02203 Unspecified lagophthalmos right eye, unspecified eyelid: Secondary | ICD-10-CM | POA: Diagnosis not present

## 2016-07-19 DIAGNOSIS — H02833 Dermatochalasis of right eye, unspecified eyelid: Secondary | ICD-10-CM | POA: Diagnosis not present

## 2016-07-19 DIAGNOSIS — H02401 Unspecified ptosis of right eyelid: Secondary | ICD-10-CM | POA: Diagnosis not present

## 2016-07-19 DIAGNOSIS — L908 Other atrophic disorders of skin: Secondary | ICD-10-CM | POA: Diagnosis not present

## 2016-07-19 DIAGNOSIS — H02102 Unspecified ectropion of right lower eyelid: Secondary | ICD-10-CM | POA: Diagnosis not present

## 2016-07-19 DIAGNOSIS — G510 Bell's palsy: Secondary | ICD-10-CM | POA: Diagnosis not present

## 2016-07-19 DIAGNOSIS — Z79899 Other long term (current) drug therapy: Secondary | ICD-10-CM | POA: Diagnosis not present

## 2016-07-19 DIAGNOSIS — I10 Essential (primary) hypertension: Secondary | ICD-10-CM | POA: Diagnosis not present

## 2016-08-05 DIAGNOSIS — Z01818 Encounter for other preprocedural examination: Secondary | ICD-10-CM | POA: Diagnosis not present

## 2016-08-05 DIAGNOSIS — G510 Bell's palsy: Secondary | ICD-10-CM | POA: Diagnosis not present

## 2016-08-11 DIAGNOSIS — G510 Bell's palsy: Secondary | ICD-10-CM | POA: Diagnosis not present

## 2016-08-11 DIAGNOSIS — Z9889 Other specified postprocedural states: Secondary | ICD-10-CM | POA: Diagnosis not present

## 2016-08-11 DIAGNOSIS — H02021 Mechanical entropion of right upper eyelid: Secondary | ICD-10-CM | POA: Diagnosis not present

## 2016-08-22 DIAGNOSIS — G510 Bell's palsy: Secondary | ICD-10-CM | POA: Diagnosis not present

## 2016-09-12 DIAGNOSIS — R972 Elevated prostate specific antigen [PSA]: Secondary | ICD-10-CM | POA: Diagnosis not present

## 2016-09-12 LAB — PSA: PSA: 5.71

## 2016-09-15 DIAGNOSIS — D333 Benign neoplasm of cranial nerves: Secondary | ICD-10-CM | POA: Diagnosis not present

## 2016-09-15 DIAGNOSIS — Z9889 Other specified postprocedural states: Secondary | ICD-10-CM | POA: Diagnosis not present

## 2016-09-20 ENCOUNTER — Encounter: Payer: Self-pay | Admitting: Internal Medicine

## 2017-01-06 DIAGNOSIS — Z09 Encounter for follow-up examination after completed treatment for conditions other than malignant neoplasm: Secondary | ICD-10-CM | POA: Diagnosis not present

## 2017-01-06 DIAGNOSIS — G510 Bell's palsy: Secondary | ICD-10-CM | POA: Diagnosis not present

## 2017-04-05 DIAGNOSIS — R29810 Facial weakness: Secondary | ICD-10-CM | POA: Diagnosis not present

## 2017-04-05 DIAGNOSIS — Z4889 Encounter for other specified surgical aftercare: Secondary | ICD-10-CM | POA: Diagnosis not present

## 2017-05-16 DIAGNOSIS — Z9889 Other specified postprocedural states: Secondary | ICD-10-CM | POA: Diagnosis not present

## 2017-05-16 DIAGNOSIS — D333 Benign neoplasm of cranial nerves: Secondary | ICD-10-CM | POA: Diagnosis not present

## 2017-05-16 DIAGNOSIS — D3610 Benign neoplasm of peripheral nerves and autonomic nervous system, unspecified: Secondary | ICD-10-CM | POA: Diagnosis not present

## 2017-07-17 ENCOUNTER — Ambulatory Visit (INDEPENDENT_AMBULATORY_CARE_PROVIDER_SITE_OTHER): Payer: Medicare Other | Admitting: Internal Medicine

## 2017-07-17 ENCOUNTER — Other Ambulatory Visit (INDEPENDENT_AMBULATORY_CARE_PROVIDER_SITE_OTHER): Payer: Medicare Other

## 2017-07-17 ENCOUNTER — Encounter: Payer: Self-pay | Admitting: Internal Medicine

## 2017-07-17 VITALS — BP 140/90 | HR 72 | Temp 98.2°F | Resp 16 | Ht 72.0 in | Wt 184.5 lb

## 2017-07-17 DIAGNOSIS — B372 Candidiasis of skin and nail: Secondary | ICD-10-CM | POA: Diagnosis not present

## 2017-07-17 DIAGNOSIS — D510 Vitamin B12 deficiency anemia due to intrinsic factor deficiency: Secondary | ICD-10-CM

## 2017-07-17 DIAGNOSIS — Z23 Encounter for immunization: Secondary | ICD-10-CM

## 2017-07-17 DIAGNOSIS — D513 Other dietary vitamin B12 deficiency anemia: Secondary | ICD-10-CM

## 2017-07-17 DIAGNOSIS — I10 Essential (primary) hypertension: Secondary | ICD-10-CM

## 2017-07-17 DIAGNOSIS — N400 Enlarged prostate without lower urinary tract symptoms: Secondary | ICD-10-CM | POA: Diagnosis not present

## 2017-07-17 DIAGNOSIS — Z0000 Encounter for general adult medical examination without abnormal findings: Secondary | ICD-10-CM | POA: Diagnosis not present

## 2017-07-17 DIAGNOSIS — R972 Elevated prostate specific antigen [PSA]: Secondary | ICD-10-CM

## 2017-07-17 DIAGNOSIS — E7800 Pure hypercholesterolemia, unspecified: Secondary | ICD-10-CM

## 2017-07-17 LAB — CBC WITH DIFFERENTIAL/PLATELET
Basophils Absolute: 0 10*3/uL (ref 0.0–0.1)
Basophils Relative: 0.5 % (ref 0.0–3.0)
EOS PCT: 2.1 % (ref 0.0–5.0)
Eosinophils Absolute: 0.1 10*3/uL (ref 0.0–0.7)
HEMATOCRIT: 38.2 % — AB (ref 39.0–52.0)
HEMOGLOBIN: 13.3 g/dL (ref 13.0–17.0)
Lymphocytes Relative: 19 % (ref 12.0–46.0)
Lymphs Abs: 1.3 10*3/uL (ref 0.7–4.0)
MCHC: 34.9 g/dL (ref 30.0–36.0)
MCV: 94.2 fl (ref 78.0–100.0)
MONOS PCT: 9.5 % (ref 3.0–12.0)
Monocytes Absolute: 0.6 10*3/uL (ref 0.1–1.0)
Neutro Abs: 4.6 10*3/uL (ref 1.4–7.7)
Neutrophils Relative %: 68.9 % (ref 43.0–77.0)
Platelets: 233 10*3/uL (ref 150.0–400.0)
RBC: 4.06 Mil/uL — AB (ref 4.22–5.81)
RDW: 12.3 % (ref 11.5–15.5)
WBC: 6.7 10*3/uL (ref 4.0–10.5)

## 2017-07-17 LAB — LIPID PANEL
Cholesterol: 162 mg/dL (ref 0–200)
HDL: 68.5 mg/dL (ref >39.00)
LDL Cholesterol: 85 mg/dL (ref 0–99)
NONHDL: 93.55
Total CHOL/HDL Ratio: 2
Triglycerides: 42 mg/dL (ref 0.0–149.0)
VLDL: 8.4 mg/dL (ref 0.0–40.0)

## 2017-07-17 LAB — PSA: PSA: 8.02 ng/mL — AB (ref 0.10–4.00)

## 2017-07-17 LAB — COMPREHENSIVE METABOLIC PANEL
ALK PHOS: 48 U/L (ref 39–117)
ALT: 16 U/L (ref 0–53)
AST: 23 U/L (ref 0–37)
Albumin: 4.7 g/dL (ref 3.5–5.2)
BUN: 13 mg/dL (ref 6–23)
CHLORIDE: 91 meq/L — AB (ref 96–112)
CO2: 28 mEq/L (ref 19–32)
Calcium: 9.7 mg/dL (ref 8.4–10.5)
Creatinine, Ser: 0.88 mg/dL (ref 0.40–1.50)
GFR: 91.03 mL/min (ref >60.00)
GLUCOSE: 108 mg/dL — AB (ref 70–99)
POTASSIUM: 4.1 meq/L (ref 3.5–5.1)
Sodium: 129 mEq/L — ABNORMAL LOW (ref 135–145)
TOTAL PROTEIN: 7 g/dL (ref 6.0–8.3)
Total Bilirubin: 0.9 mg/dL (ref 0.2–1.2)

## 2017-07-17 LAB — FOLATE: Folate: 14 ng/mL (ref >5.9)

## 2017-07-17 LAB — TSH: TSH: 1.54 u[IU]/mL (ref 0.35–4.50)

## 2017-07-17 LAB — VITAMIN B12

## 2017-07-17 MED ORDER — FLUCONAZOLE 150 MG PO TABS: 150.0000 mg | ORAL_TABLET | ORAL | 1 refills | Status: AC

## 2017-07-17 MED ORDER — CYANOCOBALAMIN 1000 MCG/ML IJ SOLN
1000.0000 ug | Freq: Once | INTRAMUSCULAR | Status: AC
  Administered 2017-07-17: 1000 ug via INTRAMUSCULAR

## 2017-07-17 NOTE — Progress Notes (Signed)
Subjective:  Patient ID: Samuel Fisher, male    DOB: 1945/12/12  Age: 72 y.o. MRN: [id number]  CC: Anemia; Hypertension; Annual Exam; and Rash   HPI Samuel Fisher presents for a CPX.  He tells me his blood pressure has been well controlled.  He denies any recent episodes of CP, DOE, palpitations, or edema.  He does complain of chronic, unchanged fatigue.  He has an enlarged prostate gland and tells me that he has an appointment with his urologist in the next few weeks.  He tells me the gland has been biopsied before and no signs of cancer were found.  He has a history of B12 deficiency but is not receiving any form of B12 replacement therapy.  Past Medical History:  Diagnosis Date  . Allergy   . Arthritis   . BPH (benign prostatic hyperplasia) 2010  . Hypertension   . Inner ear dysfunction    Past Surgical History:  Procedure Laterality Date  . JOINT REPLACEMENT    . TONSILLECTOMY  1951  . TOTAL HIP ARTHROPLASTY     right-2002 and left-2004, 2010    reports that he quit smoking about 41 years ago. he has never used smokeless tobacco. He reports that he drinks about 1.8 oz of alcohol per week. He reports that he does not use drugs. family history includes Arthritis in his other; Cancer in his father and mother; Early death in his other; Heart disease in his other; Hypertension in his father and other; Mental illness in his other; Stroke in his other. Allergies  Allergen Reactions  . Bee Venom Anaphylaxis    Allergic to Bee Sting -unknown reaction    Outpatient Medications Prior to Visit  Medication Sig Dispense Refill  . Artificial Tear Ointment (AKWA TEARS) 08-04-81 % OINT Place 1 application into the right eye 3 times daily.    Marland Kitchen lisinopril (PRINIVIL,ZESTRIL) 40 MG tablet Take 1 tablet (40 mg total) by mouth daily. 90 tablet 3  . amoxicillin (AMOXIL) 500 MG capsule Take 2,000 mg by mouth once as needed (for dentist appointment).    Marland Kitchen aspirin 325 MG tablet Take 325  mg by mouth every 4 (four) hours as needed for moderate pain or headache.     Marland Kitchen EPINEPHrine 0.3 mg/0.3 mL IJ SOAJ injection Inject 0.3 mLs (0.3 mg total) into the muscle once. (Patient not taking: Reported on 07/17/2017) 2 Device 0  . fluticasone (FLONASE) 50 MCG/ACT nasal spray Place 1-2 sprays into both nostrils daily as needed. allergies  2  . hydrochlorothiazide (HYDRODIURIL) 25 MG tablet Take 25 mg by mouth daily.    . diazepam (VALIUM) 5 MG tablet Take 5 mg by mouth.    Marland Kitchen ibuprofen (ADVIL,MOTRIN) 200 MG tablet Take 200-800 mg by mouth every 6 (six) hours as needed for moderate pain.     No facility-administered medications prior to visit.     ROS Review of Systems  Constitutional: Positive for unexpected weight change (wt gain). Negative for activity change, appetite change, diaphoresis and fatigue.  HENT: Negative.  Negative for sinus pressure and trouble swallowing.   Eyes: Negative for visual disturbance.  Respiratory: Negative.  Negative for cough, chest tightness, shortness of breath and wheezing.   Cardiovascular: Negative for chest pain, palpitations and leg swelling.  Gastrointestinal: Negative for abdominal pain, constipation, diarrhea, nausea and vomiting.  Endocrine: Negative for cold intolerance and heat intolerance.  Genitourinary: Negative.  Negative for difficulty urinating.  Musculoskeletal: Negative.   Skin: Positive for rash.  Neurological: Negative.  Negative for dizziness, weakness, light-headedness and numbness.  Hematological: Negative for adenopathy. Does not bruise/bleed easily.  Psychiatric/Behavioral: Negative.  Negative for agitation, dysphoric mood, sleep disturbance and suicidal ideas.    Objective:  BP 140/90 (BP Location: Left Arm, Patient Position: Sitting, Cuff Size: Normal)   Pulse 72   Temp 98.2 F (36.8 C) (Oral)   Resp 16   Ht 6' (1.829 m)   Wt 184 lb 8 oz (83.7 kg)   SpO2 97%   BMI 25.02 kg/m   BP Readings from Last 3 Encounters:    07/17/17 140/90  07/14/16 120/70  12/30/15 139/96    Wt Readings from Last 3 Encounters:  07/17/17 184 lb 8 oz (83.7 kg)  07/14/16 182 lb 1.9 oz (82.6 kg)  12/30/15 183 lb (83 kg)    Physical Exam  Constitutional: He is oriented to person, place, and time. No distress.  HENT:  Mouth/Throat: Oropharynx is clear and moist. No oropharyngeal exudate.  Eyes: Conjunctivae are normal. Left eye exhibits no discharge. No scleral icterus.  Neck: Normal range of motion. Neck supple. No JVD present. No thyromegaly present.  Cardiovascular: Normal rate, regular rhythm and normal heart sounds. Exam reveals no gallop.  No murmur heard. Pulmonary/Chest: Effort normal and breath sounds normal. No respiratory distress. He has no wheezes. He has no rales.  Abdominal: Soft. Bowel sounds are normal. He exhibits no distension and no mass. There is no tenderness. Hernia confirmed negative in the right inguinal area and confirmed negative in the left inguinal area.  Genitourinary: Rectum normal, testes normal and penis normal. Rectal exam shows no external hemorrhoid, no internal hemorrhoid, no fissure, no mass, no tenderness, anal tone normal and guaiac negative stool. Prostate is enlarged (3++ BPH). Prostate is not tender. Right testis shows no swelling and no tenderness. Left testis shows no mass, no swelling and no tenderness. Circumcised. No penile erythema or penile tenderness. No discharge found.     Musculoskeletal: Normal range of motion. He exhibits no edema, tenderness or deformity.  Lymphadenopathy:    He has no cervical adenopathy.       Right: No inguinal adenopathy present.       Left: No inguinal adenopathy present.  Neurological: He is alert and oriented to person, place, and time.  Skin: Skin is warm and dry. No rash noted. He is not diaphoretic. No erythema. No pallor.  Psychiatric: He has a normal mood and affect. His behavior is normal. Judgment and thought content normal.  Vitals  reviewed.   Lab Results  Component Value Date   WBC 6.7 07/17/2017   HGB 13.3 07/17/2017   HCT 38.2 (L) 07/17/2017   PLT 233.0 07/17/2017   GLUCOSE 108 (H) 07/17/2017   CHOL 162 07/17/2017   TRIG 42.0 07/17/2017   HDL 68.50 07/17/2017   LDLCALC 85 07/17/2017   ALT 16 07/17/2017   AST 23 07/17/2017   NA 129 (L) 07/17/2017   K 4.1 07/17/2017   CL 91 (L) 07/17/2017   CREATININE 0.88 07/17/2017   BUN 13 07/17/2017   CO2 28 07/17/2017   TSH 1.54 07/17/2017   PSA 8.02 (H) 07/17/2017    Mr Brain W Wo Contrast  Result Date: 12/30/2015 CLINICAL DATA:  Dizziness, RIGHT-sided hearing loss. Two falls today. History of hypertension, LEFT hearing loss and inner ear dysfunction. EXAM: MRI HEAD WITHOUT AND WITH CONTRAST TECHNIQUE: Multiplanar, multiecho pulse sequences of the brain and surrounding structures were obtained without and with intravenous contrast. CONTRAST:  34mL MULTIHANCE GADOBENATE DIMEGLUMINE 529 MG/ML IV SOLN COMPARISON:  None. FINDINGS: INTRACRANIAL CONTENTS: 2.5 x 4.5 cm (transverse by AP) RIGHT cerebellar pontine angle cystic mass with enhancing septations, and thin mural enhancement extends into the RIGHT cerebellar pontine angle (bright T2 FLAIR). Mass effect on the subjacent brachium pontis without parenchymal edema or enhancement to suggest invasion. A second cystic 12 x 19 mm mass in RIGHT Meckel's cave with low T2 FLAIR signal. Moderate ventriculomegaly on the basis of global parenchymal brain volume loss. No reduced diffusion to suggest acute ischemia or hypercellular tumor. Linear flow voids within the primary CP angle mass. No susceptibility artifact to suggest hemorrhage. No abnormal parenchymal enhancement. Mild white matter changes most compatible chronic small vessel ischemic disease, less than expected for age. No abnormal extra-axial fluid collections. Normal major intracranial vascular flow voids present at skull base. ORBITS: The included ocular globes and orbital  contents are non-suspicious. SINUSES: Trace paranasal sinus mucosal thickening. Trace LEFT mastoid effusion. SKULL/SOFT TISSUES: No abnormal sellar expansion. No suspicious calvarial bone marrow signal. Craniocervical junction maintained. 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 12 x 19 mm cystic mass in RIGHT Meckel's cave with slightly different imaging characteristics suggesting obstruction and accumulation of CSF, possible arachnoid cyst versus second schwannoma. Moderate global brain atrophy, mildly advanced for age. Mild chronic small vessel ischemic disease. Electronically Signed   By: Elon Alas M.D.   On: 12/30/2015 22:25    Assessment & Plan:   Armen was seen today for anemia, hypertension, annual exam and rash.  Diagnoses and all orders for this visit:  Essential hypertension, benign- His blood pressure is well controlled.  Labs are negative for secondary causes or endorgan damage. -     Comprehensive metabolic panel; Future  Vitamin B12 deficiency anemia due to intrinsic factor deficiency - He is mildly anemic.  Will start B12 replacement therapy. -     CBC with Differential/Platelet; Future -     Vitamin B12; Future -     Folate; Future -     cyanocobalamin ((VITAMIN B-12)) injection 1,000 mcg  Other dietary vitamin B12 deficiency anemia  PSA elevation- His PSA has risen.  He will see his urologist soon. -     PSA; Future  Pure hypercholesterolemia- He has an elevated ASCVD risk score so I have asked him to start taking a statin for CV risk reduction. -     Lipid panel; Future -     TSH; Future  Benign prostatic hyperplasia without lower urinary tract symptoms -     PSA; Future  Yeast infection of the skin -     fluconazole (DIFLUCAN) 150 MG tablet; Take 1 tablet (150 mg total) by mouth once a week. Weekly for 4 weeks  Need for influenza  vaccination -     Flu vaccine HIGH DOSE PF (Fluzone High dose)  Need for pneumococcal vaccination -     Pneumococcal polysaccharide vaccine 23-valent greater than or equal to 2yo subcutaneous/IM   I have discontinued Roderic Palau B. Glassberg's ibuprofen and diazepam. I am also having him start on fluconazole. Additionally, I am having him maintain his aspirin, lisinopril, EPINEPHrine, amoxicillin, fluticasone, AKWA TEARS, and hydrochlorothiazide. We administered cyanocobalamin.  Meds ordered this encounter  Medications  . fluconazole (DIFLUCAN) 150 MG tablet    Sig: Take 1 tablet (150 mg total) by mouth once a week. Weekly for 4 weeks  Dispense:  4 tablet    Refill:  1  . cyanocobalamin ((VITAMIN B-12)) injection 1,000 mcg     Follow-up: Return in about 6 months (around 01/14/2018).  Scarlette Calico, MD

## 2017-07-17 NOTE — Patient Instructions (Signed)

## 2017-07-19 MED ORDER — ATORVASTATIN CALCIUM 10 MG PO TABS: 10.0000 mg | ORAL_TABLET | Freq: Every day | ORAL | 1 refills | Status: DC

## 2017-07-19 NOTE — Assessment & Plan Note (Signed)

## 2017-07-28 ENCOUNTER — Ambulatory Visit: Payer: Self-pay

## 2017-07-28 ENCOUNTER — Telehealth: Payer: Self-pay | Admitting: Internal Medicine

## 2017-07-28 NOTE — Telephone Encounter (Signed)
Patient called in with c/o "rash after getting shingles vaccine." He says "I noticed it about 3 days ago, a rash on the back of my neck. My wife looked at it and says it is red, bumpy. It doesn't itch, but mild pain enough to make me notice." I asked is he having pain or is rash any where else on his body, he said "no fever and the rash is only the one place." He said he received the shingles vaccine last week and was wondering if this is the shingles or a reaction from the vaccine. I advised in the literature, it says a rash is a side effect that the provider will need to know about, so I advise him to be seen in the office, he agreed. According to protocol, see PCP within 24 hours. No availability with provider or practice for today.  Appointment will be made for tomorrow when the Saturday clinic schedule is availabe, patient asked to be called back with an appointment time.  Care advice given, patient verbalized understanding.   Reason for Disposition . [1] Shingles rash AND [2] onset > 72 hours ago  Answer Assessment - Initial Assessment Questions 1. APPEARANCE of RASH: "Describe the rash."      Red, bumpy 2. LOCATION: "Where is the rash located?"      Back of neck 3. ONSET: "When did the rash start?"      3 days ago 4. ITCHING: "Does the rash itch?" If so, ask: "How bad is the itch?"  (Scale 1-10; or mild, moderate, severe)     Not really 5. PAIN: "Does the rash hurt?" If so, ask: "How bad is the pain?"  (Scale 1-10; or mild, moderate, severe)     A little painful, mild 6. OTHER SYMPTOMS: "Do you have any other symptoms?" (e.g., fever)     No 7. PREGNANCY: "Is there any chance you are pregnant?" "When was your last menstrual period?"     N/A  Protocols used: [facility]

## 2017-07-28 NOTE — Telephone Encounter (Signed)
Patient called [phone], left VM appointment is tomorrow, 07/29/17 at [place] with Dr. Jonni Sanger at [facility], address given, advised to call back with concerns about this appointment.

## 2017-07-29 ENCOUNTER — Ambulatory Visit: Payer: Medicare Other | Admitting: Family Medicine

## 2017-07-29 ENCOUNTER — Encounter: Payer: Self-pay | Admitting: Family Medicine

## 2017-07-29 VITALS — BP 136/80 | HR 66 | Temp 98.4°F | Ht 72.0 in | Wt 180.2 lb

## 2017-07-29 DIAGNOSIS — B029 Zoster without complications: Secondary | ICD-10-CM

## 2017-07-29 NOTE — Patient Instructions (Addendum)
Please follow up if symptoms do not improve or as needed.  Use Capsaicin cream to the rash to help with the pain    Shingles Shingles, which is also known as herpes zoster, is an infection that causes a painful skin rash and fluid-filled blisters. Shingles is not related to genital herpes, which is a sexually transmitted infection. Shingles only develops in people who:  Have had chickenpox.  Have received the chickenpox vaccine. (This is rare.)  What are the causes? Shingles is caused by varicella-zoster virus (VZV). This is the same virus that causes chickenpox. After exposure to VZV, the virus stays in the body in an inactive (dormant) state. Shingles develops if the virus reactivates. This can happen many years after the initial exposure to VZV. It is not known what causes this virus to reactivate. What increases the risk? People who have had chickenpox or received the chickenpox vaccine are at risk for shingles. Infection is more common in people who:  Are older than age 5.  Have a weakened defense (immune) system, such as those with HIV, AIDS, or cancer.  Are taking medicines that weaken the immune system, such as transplant medicines.  Are under great stress.  What are the signs or symptoms? Early symptoms of this condition include itching, tingling, and pain in an area on your skin. Pain may be described as burning, stabbing, or throbbing. A few days or weeks after symptoms start, a painful red rash appears, usually on one side of the body in a bandlike or beltlike pattern. The rash eventually turns into fluid-filled blisters that break open, scab over, and dry up in about 2-3 weeks. At any time during the infection, you may also develop:  A fever.  Chills.  A headache.  An upset stomach.  How is this diagnosed? This condition is diagnosed with a skin exam. Sometimes, skin or fluid samples are taken from the blisters before a diagnosis is made. These samples are  examined under a microscope or sent to a lab for testing. How is this treated? There is no specific cure for this condition. Your health care provider will probably prescribe medicines to help you manage pain, recover more quickly, and avoid long-term problems. Medicines may include:  Antiviral drugs.  Anti-inflammatory drugs.  Pain medicines.  If the area involved is on your face, you may be referred to a specialist, such as an eye doctor (ophthalmologist) or an ear, nose, and throat (ENT) doctor to help you avoid eye problems, chronic pain, or disability. Follow these instructions at home: Medicines  Take medicines only as directed by your health care provider.  Apply an anti-itch or numbing cream to the affected area as directed by your health care provider. Blister and Rash Care  Take a cool bath or apply cool compresses to the area of the rash or blisters as directed by your health care provider. This may help with pain and itching.  Keep your rash covered with a loose bandage (dressing). Wear loose-fitting clothing to help ease the pain of material rubbing against the rash.  Keep your rash and blisters clean with mild soap and cool water or as directed by your health care provider.  Check your rash every day for signs of infection. These include redness, swelling, and pain that lasts or increases.  Do not pick your blisters.  Do not scratch your rash. General instructions  Rest as directed by your health care provider.  Keep all follow-up visits as directed by your health  care provider. This is important.  Until your blisters scab over, your infection can cause chickenpox in people who have never had it or been vaccinated against it. To prevent this from happening, avoid contact with other people, especially: ? Babies. ? Pregnant women. ? Children who have eczema. ? Elderly people who have transplants. ? People who have chronic illnesses, such as leukemia or  AIDS. Contact a health care provider if:  Your pain is not relieved with prescribed medicines.  Your pain does not get better after the rash heals.  Your rash looks infected. Signs of infection include redness, swelling, and pain that lasts or increases. Get help right away if:  The rash is on your face or nose.  You have facial pain, pain around your eye area, or loss of feeling on one side of your face.  You have ear pain or you have ringing in your ear.  You have loss of taste.  Your condition gets worse. This information is not intended to replace advice given to you by your health care provider. Make sure you discuss any questions you have with your health care provider. Document Released: 06/06/2005 Document Revised: 01/31/2016 Document Reviewed: 04/17/2014 Elsevier Interactive Patient Education  2018 [employer].

## 2017-07-29 NOTE — Progress Notes (Signed)
Subjective  CC:  Chief Complaint  Patient presents with  . Rash    backside if right neck, painful    HPI: Samuel Fisher is a 72 y.o. male who presents to the office today to address the problems listed above in the chief complaint.  4-5 days ago noted a bump on back of neck; since the rash has spread and is sore. Is on right side of neck only. No associated fevers, chills or symptoms. Has had shingles before; hasn't had shingles vaccination. Otherwise, is at his baseline. Using advil for pain.  I reviewed the patients updated PMH, FH, and SocHx.    Patient Active Problem List   Diagnosis Date Noted  . Yeast infection of the skin 07/17/2017  . Other dietary vitamin B12 deficiency anemia 07/15/2016  . Benign schwannoma 07/14/2016  . Essential hypertension, benign 06/28/2012  . Pure hypercholesterolemia 06/28/2012  . BPH (benign prostatic hyperplasia) 06/28/2012  . Routine general medical examination at a health care facility 06/28/2012  . PSA elevation 06/28/2012   Current Meds  Medication Sig  . amoxicillin (AMOXIL) 500 MG capsule Take 2,000 mg by mouth once as needed (for dentist appointment).  . Artificial Tear Ointment (AKWA TEARS) 08-04-81 % OINT Place 1 application into the right eye 3 times daily.  Marland Kitchen aspirin 325 MG tablet Take 325 mg by mouth every 4 (four) hours as needed for moderate pain or headache.   Marland Kitchen atorvastatin (LIPITOR) 10 MG tablet Take 1 tablet (10 mg total) by mouth daily.  Marland Kitchen EPINEPHrine 0.3 mg/0.3 mL IJ SOAJ injection Inject 0.3 mLs (0.3 mg total) into the muscle once.  . fluconazole (DIFLUCAN) 150 MG tablet Take 1 tablet (150 mg total) by mouth once a week. Weekly for 4 weeks  . fluticasone (FLONASE) 50 MCG/ACT nasal spray Place 1-2 sprays into both nostrils daily as needed. allergies  . hydrochlorothiazide (HYDRODIURIL) 25 MG tablet Take 25 mg by mouth daily.  Marland Kitchen lisinopril (PRINIVIL,ZESTRIL) 40 MG tablet Take 1 tablet (40 mg total) by mouth daily.      Allergies: Patient is allergic to bee venom. Family History: Patient family history includes Arthritis in his other; Cancer in his father and mother; Early death in his other; Heart disease in his other; Hypertension in his father and other; Mental illness in his other; Stroke in his other. Social History:  Patient  reports that he quit smoking about 41 years ago. he has never used smokeless tobacco. He reports that he drinks about 1.8 oz of alcohol per week. He reports that he does not use drugs.  Review of Systems: Constitutional: Negative for fever malaise or anorexia Cardiovascular: negative for chest pain Respiratory: negative for SOB or persistent cough Gastrointestinal: negative for abdominal pain  Objective  Vitals: BP 136/80 (BP Location: Left Arm, Patient Position: Sitting, Cuff Size: Normal)   Pulse 66   Temp 98.4 F (36.9 C) (Oral)   Ht 6' (1.829 m)   Wt 180 lb 4 oz (81.8 kg)   SpO2 97%   BMI 24.45 kg/m  General: no acute distress , A&Ox3 Skin:  Warm, right back of neck with scabbed old vesicular rash in dermatomal distrubution. No drainage  Assessment  1. Herpes zoster without complication      Plan   shingles:  Educated: no antiviral due to length of rash. Minimal discomfort. Supportive care discussed. Once improved, recommend shingrix.   Follow up: No Follow-up on file.    Commons side effects, risks, benefits, and  alternatives for medications and treatment plan prescribed today were discussed, and the patient expressed understanding of the given instructions. Patient is instructed to call or message via MyChart if he/she has any questions or concerns regarding our treatment plan. No barriers to understanding were identified. We discussed Red Flag symptoms and signs in detail. Patient expressed understanding regarding what to do in case of urgent or emergency type symptoms.   Medication list was reconciled, printed and provided to the patient in AVS.  Patient instructions and summary information was reviewed with the patient as documented in the AVS. This note was prepared with assistance of Dragon voice recognition software. Occasional wrong-word or sound-a-like substitutions may have occurred due to the inherent limitations of voice recognition software

## 2017-08-04 DIAGNOSIS — G510 Bell's palsy: Secondary | ICD-10-CM | POA: Diagnosis not present

## 2017-09-18 DIAGNOSIS — N400 Enlarged prostate without lower urinary tract symptoms: Secondary | ICD-10-CM | POA: Diagnosis not present

## 2017-09-18 DIAGNOSIS — R972 Elevated prostate specific antigen [PSA]: Secondary | ICD-10-CM | POA: Diagnosis not present

## 2018-01-15 ENCOUNTER — Encounter: Payer: Self-pay | Admitting: Internal Medicine

## 2018-01-15 ENCOUNTER — Ambulatory Visit (INDEPENDENT_AMBULATORY_CARE_PROVIDER_SITE_OTHER): Payer: Medicare Other | Admitting: Internal Medicine

## 2018-01-15 ENCOUNTER — Other Ambulatory Visit (INDEPENDENT_AMBULATORY_CARE_PROVIDER_SITE_OTHER): Payer: Medicare Other

## 2018-01-15 VITALS — BP 160/86 | HR 79 | Temp 98.8°F | Resp 16 | Ht 72.0 in | Wt 182.0 lb

## 2018-01-15 DIAGNOSIS — E559 Vitamin D deficiency, unspecified: Secondary | ICD-10-CM

## 2018-01-15 DIAGNOSIS — I10 Essential (primary) hypertension: Secondary | ICD-10-CM

## 2018-01-15 DIAGNOSIS — Z23 Encounter for immunization: Secondary | ICD-10-CM

## 2018-01-15 DIAGNOSIS — D513 Other dietary vitamin B12 deficiency anemia: Secondary | ICD-10-CM | POA: Diagnosis not present

## 2018-01-15 DIAGNOSIS — D510 Vitamin B12 deficiency anemia due to intrinsic factor deficiency: Secondary | ICD-10-CM | POA: Diagnosis not present

## 2018-01-15 DIAGNOSIS — E538 Deficiency of other specified B group vitamins: Secondary | ICD-10-CM | POA: Insufficient documentation

## 2018-01-15 LAB — CBC WITH DIFFERENTIAL/PLATELET
BASOS ABS: 0.1 10*3/uL (ref 0.0–0.1)
BASOS PCT: 0.9 % (ref 0.0–3.0)
Eosinophils Absolute: 0.1 10*3/uL (ref 0.0–0.7)
Eosinophils Relative: 2.1 % (ref 0.0–5.0)
HEMATOCRIT: 40.1 % (ref 39.0–52.0)
Hemoglobin: 14 g/dL (ref 13.0–17.0)
LYMPHS PCT: 20.6 % (ref 12.0–46.0)
Lymphs Abs: 1.3 10*3/uL (ref 0.7–4.0)
MCHC: 34.9 g/dL (ref 30.0–36.0)
MCV: 94.8 fl (ref 78.0–100.0)
MONOS PCT: 9.6 % (ref 3.0–12.0)
Monocytes Absolute: 0.6 10*3/uL (ref 0.1–1.0)
NEUTROS ABS: 4.1 10*3/uL (ref 1.4–7.7)
Neutrophils Relative %: 66.8 % (ref 43.0–77.0)
PLATELETS: 236 10*3/uL (ref 150.0–400.0)
RBC: 4.23 Mil/uL (ref 4.22–5.81)
RDW: 12.8 % (ref 11.5–15.5)
WBC: 6.1 10*3/uL (ref 4.0–10.5)

## 2018-01-15 LAB — BASIC METABOLIC PANEL
BUN: 14 mg/dL (ref 6–23)
CHLORIDE: 93 meq/L — AB (ref 96–112)
CO2: 26 mEq/L (ref 19–32)
CREATININE: 0.88 mg/dL (ref 0.40–1.50)
Calcium: 10.5 mg/dL (ref 8.4–10.5)
GFR: 90.9 mL/min (ref >60.00)
Glucose, Bld: 106 mg/dL — ABNORMAL HIGH (ref 70–99)
Potassium: 4.3 mEq/L (ref 3.5–5.1)
Sodium: 133 mEq/L — ABNORMAL LOW (ref 135–145)

## 2018-01-15 LAB — VITAMIN D 25 HYDROXY (VIT D DEFICIENCY, FRACTURES): VITD: 26.25 ng/mL — ABNORMAL LOW (ref 30.00–100.00)

## 2018-01-15 MED ORDER — AZILSARTAN-CHLORTHALIDONE 40-12.5 MG PO TABS: 1.0000 | ORAL_TABLET | Freq: Every day | ORAL | 1 refills | Status: DC

## 2018-01-15 MED ORDER — ZOSTER VAC RECOMB ADJUVANTED 50 MCG/0.5ML IM SUSR: 0.5000 mL | Freq: Once | INTRAMUSCULAR | 1 refills | Status: AC

## 2018-01-15 MED ORDER — CHOLECALCIFEROL 50 MCG (2000 UT) PO TABS: 1.0000 | ORAL_TABLET | Freq: Every day | ORAL | 1 refills | Status: DC

## 2018-01-15 MED ORDER — CYANOCOBALAMIN 1000 MCG/ML IJ SOLN
1000.0000 ug | Freq: Once | INTRAMUSCULAR | Status: AC
  Administered 2018-01-15: 1000 ug via INTRAMUSCULAR

## 2018-01-15 NOTE — Progress Notes (Signed)
Subjective:  Patient ID: Samuel Fisher, male    DOB: 1946-01-02  Age: 72 y.o. MRN: [id number]  CC: Hypertension and Anemia   HPI Samuel Fisher presents for a BP check - He is compliant with a thiazide diuretic and ACE inhibitor but says his blood pressure has not been well controlled.  He denies any recent episodes of headache, blurred vision, CP, DOE, palpitations, edema, or fatigue.  He has a history of an elevated PSA and is followed closely by urology.  Outpatient Medications Prior to Visit  Medication Sig Dispense Refill  . Artificial Tear Ointment (AKWA TEARS) 08-04-81 % OINT Place 1 application into the right eye 3 times daily.    Marland Kitchen aspirin 325 MG tablet Take 325 mg by mouth every 4 (four) hours as needed for moderate pain or headache.     . fluticasone (FLONASE) 50 MCG/ACT nasal spray Place 1-2 sprays into both nostrils daily as needed. allergies  2  . hydrochlorothiazide (HYDRODIURIL) 25 MG tablet Take 25 mg by mouth daily.    Marland Kitchen lisinopril (PRINIVIL,ZESTRIL) 40 MG tablet Take 1 tablet (40 mg total) by mouth daily. 90 tablet 3  . amoxicillin (AMOXIL) 500 MG capsule Take 2,000 mg by mouth once as needed (for dentist appointment).    . EPINEPHrine 0.3 mg/0.3 mL IJ SOAJ injection Inject 0.3 mLs (0.3 mg total) into the muscle once. (Patient not taking: Reported on 01/15/2018) 2 Device 0  . atorvastatin (LIPITOR) 10 MG tablet Take 1 tablet (10 mg total) by mouth daily. 90 tablet 1   No facility-administered medications prior to visit.     ROS Review of Systems  Constitutional: Negative for diaphoresis and fatigue.  HENT: Negative.   Eyes: Negative for pain and visual disturbance.  Respiratory: Negative for apnea, cough, chest tightness, shortness of breath and wheezing.   Cardiovascular: Negative for chest pain, palpitations and leg swelling.  Gastrointestinal: Negative for abdominal pain, constipation, diarrhea, nausea and vomiting.  Genitourinary: Negative.  Negative for  decreased urine volume, difficulty urinating and frequency.  Musculoskeletal: Negative.  Negative for arthralgias and myalgias.  Skin: Negative.  Negative for color change.  Neurological: Negative.  Negative for dizziness, weakness, light-headedness and headaches.  Hematological: Negative for adenopathy. Does not bruise/bleed easily.  Psychiatric/Behavioral: Negative.     Objective:  BP (!) 160/86 (BP Location: Left Arm, Patient Position: Sitting, Cuff Size: Normal)   Pulse 79   Temp 98.8 F (37.1 C) (Oral)   Resp 16   Ht 6' (1.829 m)   Wt 182 lb (82.6 kg)   SpO2 97%   BMI 24.68 kg/m   BP Readings from Last 3 Encounters:  01/15/18 (!) 160/86  07/29/17 136/80  07/17/17 140/90    Wt Readings from Last 3 Encounters:  01/15/18 182 lb (82.6 kg)  07/29/17 180 lb 4 oz (81.8 kg)  07/17/17 184 lb 8 oz (83.7 kg)    Physical Exam  Constitutional: He is oriented to person, place, and time. No distress.  HENT:  Mouth/Throat: Oropharynx is clear and moist. No oropharyngeal exudate.  Eyes: Conjunctivae are normal. No scleral icterus.  Neck: Normal range of motion. Neck supple. No JVD present. No thyromegaly present.  Cardiovascular: Normal rate, regular rhythm and normal heart sounds. Exam reveals no gallop.  No murmur heard. Pulmonary/Chest: Effort normal and breath sounds normal. He has no wheezes. He has no rhonchi. He has no rales.  Abdominal: Soft. Normal appearance and bowel sounds are normal. He exhibits no mass. There  is no hepatosplenomegaly. There is no tenderness.  Musculoskeletal: Normal range of motion. He exhibits no edema, tenderness or deformity.  Lymphadenopathy:    He has no cervical adenopathy.  Neurological: He is alert and oriented to person, place, and time.  Skin: Skin is warm and dry. No rash noted. He is not diaphoretic.  Vitals reviewed.   Lab Results  Component Value Date   WBC 6.1 01/15/2018   HGB 14.0 01/15/2018   HCT 40.1 01/15/2018   PLT 236.0  01/15/2018   GLUCOSE 106 (H) 01/15/2018   CHOL 162 07/17/2017   TRIG 42.0 07/17/2017   HDL 68.50 07/17/2017   LDLCALC 85 07/17/2017   ALT 16 07/17/2017   AST 23 07/17/2017   NA 133 (L) 01/15/2018   K 4.3 01/15/2018   CL 93 (L) 01/15/2018   CREATININE 0.88 01/15/2018   BUN 14 01/15/2018   CO2 26 01/15/2018   TSH 1.54 07/17/2017   PSA 8.02 (H) 07/17/2017    Mr Brain W Wo Contrast  Result Date: 12/30/2015 CLINICAL DATA:  Dizziness, RIGHT-sided hearing loss. Two falls today. History of hypertension, LEFT hearing loss and inner ear dysfunction. EXAM: MRI HEAD WITHOUT AND WITH CONTRAST TECHNIQUE: Multiplanar, multiecho pulse sequences of the brain and surrounding structures were obtained without and with intravenous contrast. CONTRAST:  2mL MULTIHANCE GADOBENATE DIMEGLUMINE 529 MG/ML IV SOLN COMPARISON:  None. FINDINGS: INTRACRANIAL CONTENTS: 2.5 x 4.5 cm (transverse by AP) RIGHT cerebellar pontine angle cystic mass with enhancing septations, and thin mural enhancement extends into the RIGHT cerebellar pontine angle (bright T2 FLAIR). Mass effect on the subjacent brachium pontis without parenchymal edema or enhancement to suggest invasion. A second cystic 12 x 19 mm mass in RIGHT Meckel's cave with low T2 FLAIR signal. Moderate ventriculomegaly on the basis of global parenchymal brain volume loss. No reduced diffusion to suggest acute ischemia or hypercellular tumor. Linear flow voids within the primary CP angle mass. No susceptibility artifact to suggest hemorrhage. No abnormal parenchymal enhancement. Mild white matter changes most compatible chronic small vessel ischemic disease, less than expected for age. No abnormal extra-axial fluid collections. Normal major intracranial vascular flow voids present at skull base. ORBITS: The included ocular globes and orbital contents are non-suspicious. SINUSES: Trace paranasal sinus mucosal thickening. Trace LEFT mastoid effusion. SKULL/SOFT TISSUES: No  abnormal sellar expansion. No suspicious calvarial bone marrow signal. Craniocervical junction maintained. 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 12 x 19 mm cystic mass in RIGHT Meckel's cave with slightly different imaging characteristics suggesting obstruction and accumulation of CSF, possible arachnoid cyst versus second schwannoma. Moderate global brain atrophy, mildly advanced for age. Mild chronic small vessel ischemic disease. Electronically Signed   By: Elon Alas M.D.   On: 12/30/2015 22:25    Assessment & Plan:   Samuel Fisher was seen today for hypertension and anemia.  Diagnoses and all orders for this visit:  Essential hypertension, benign- His blood pressure is not adequately well controlled.  I will treat the vitamin D deficiency.  I will upgrade him to a more potent thiazide diuretic and have asked him to change from an ACE inhibitor to an ARB which I think would be more effective at controlling his blood pressure. -     Basic metabolic panel; Future -     CBC with Differential/Platelet; Future -     VITAMIN D 25 Hydroxy (Vit-D Deficiency, Fractures); Future -  Azilsartan-Chlorthalidone (EDARBYCLOR) 40-12.5 MG TABS; Take 1 tablet by mouth daily.  Other dietary vitamin B12 deficiency anemia -     CBC with Differential/Platelet; Future  Need for shingles vaccine -     Zoster Vaccine Adjuvanted [facility]) injection; Inject 0.5 mLs into the muscle once for 1 dose.  Vitamin B12 deficiency anemia due to intrinsic factor deficiency -     cyanocobalamin ((VITAMIN B-12)) injection 1,000 mcg  Vitamin D deficiency -     Cholecalciferol 2000 units TABS; Take 1 tablet (2,000 Units total) by mouth daily.   I have discontinued Samuel Fisher's lisinopril, hydrochlorothiazide, and atorvastatin. I am also having him start on Zoster Vaccine Adjuvanted,  Azilsartan-Chlorthalidone, and Cholecalciferol. Additionally, I am having him maintain his aspirin, EPINEPHrine, amoxicillin, fluticasone, and AKWA TEARS. We administered cyanocobalamin.  Meds ordered this encounter  Medications  . Zoster Vaccine Adjuvanted [facility]) injection    Sig: Inject 0.5 mLs into the muscle once for 1 dose.    Dispense:  0.5 mL    Refill:  1  . Azilsartan-Chlorthalidone (EDARBYCLOR) 40-12.5 MG TABS    Sig: Take 1 tablet by mouth daily.    Dispense:  90 tablet    Refill:  1  . cyanocobalamin ((VITAMIN B-12)) injection 1,000 mcg  . Cholecalciferol 2000 units TABS    Sig: Take 1 tablet (2,000 Units total) by mouth daily.    Dispense:  90 tablet    Refill:  1     Follow-up: Return in about 3 months (around 04/17/2018).  Scarlette Calico, MD

## 2018-01-15 NOTE — Patient Instructions (Signed)

## 2018-01-16 ENCOUNTER — Other Ambulatory Visit: Payer: Self-pay | Admitting: Internal Medicine

## 2018-01-19 ENCOUNTER — Telehealth: Payer: Self-pay

## 2018-01-19 NOTE — Telephone Encounter (Signed)
Key: [id number]

## 2018-01-25 NOTE — Telephone Encounter (Signed)
Can you let him know that the PA for Carole Binning was approved?

## 2018-01-26 NOTE — Telephone Encounter (Signed)
Let message informing patient.

## 2018-05-31 ENCOUNTER — Telehealth: Payer: Self-pay | Admitting: Internal Medicine

## 2018-05-31 NOTE — Telephone Encounter (Signed)
Okay to schedule? Patient told [facility] agent that he was suppose to get them monthly but looks like the last one was given in July.   Copied from [place] [phone]. Topic: Appointment Scheduling - Scheduling Inquiry for Clinic >> May 31, 2018 11:03 AM Lennox Solders wrote: reason for CRM: Pt is calling and would like to schedule next b12 inj. The last b12 was given in July 2019

## 2018-05-31 NOTE — Telephone Encounter (Signed)
Last vitamin b12 lab was >1500 (done in January 2019). Last vitamin B12 injection was given in July.   Please advise if pt can have b12.

## 2018-05-31 NOTE — Telephone Encounter (Signed)
Yes, he can get a b12 injection  TJ

## 2018-06-01 NOTE — Telephone Encounter (Signed)
Called patient and left message for him to call back to schedule. Nurse / Nurse Visit / B12 injection

## 2018-06-04 ENCOUNTER — Ambulatory Visit (INDEPENDENT_AMBULATORY_CARE_PROVIDER_SITE_OTHER): Payer: Medicare Other

## 2018-06-04 DIAGNOSIS — D510 Vitamin B12 deficiency anemia due to intrinsic factor deficiency: Secondary | ICD-10-CM | POA: Diagnosis not present

## 2018-06-04 MED ORDER — CYANOCOBALAMIN 1000 MCG/ML IJ SOLN
1000.0000 ug | Freq: Once | INTRAMUSCULAR | Status: AC
  Administered 2018-06-04: 1000 ug via INTRAMUSCULAR

## 2018-06-04 NOTE — Progress Notes (Signed)
I have reviewed and agree.

## 2018-06-07 DIAGNOSIS — D333 Benign neoplasm of cranial nerves: Secondary | ICD-10-CM | POA: Diagnosis not present

## 2018-06-07 DIAGNOSIS — Z9889 Other specified postprocedural states: Secondary | ICD-10-CM | POA: Diagnosis not present

## 2018-06-07 DIAGNOSIS — J320 Chronic maxillary sinusitis: Secondary | ICD-10-CM | POA: Diagnosis not present

## 2018-07-03 DIAGNOSIS — H02239 Paralytic lagophthalmos unspecified eye, unspecified eyelid: Secondary | ICD-10-CM | POA: Diagnosis not present

## 2018-07-03 DIAGNOSIS — H02231 Paralytic lagophthalmos right upper eyelid: Secondary | ICD-10-CM | POA: Insufficient documentation

## 2018-07-03 DIAGNOSIS — H57811 Brow ptosis, right: Secondary | ICD-10-CM | POA: Diagnosis not present

## 2018-07-03 DIAGNOSIS — G510 Bell's palsy: Secondary | ICD-10-CM | POA: Diagnosis not present

## 2018-07-05 ENCOUNTER — Ambulatory Visit (INDEPENDENT_AMBULATORY_CARE_PROVIDER_SITE_OTHER): Payer: Medicare Other

## 2018-07-05 DIAGNOSIS — Z23 Encounter for immunization: Secondary | ICD-10-CM

## 2018-07-05 DIAGNOSIS — D519 Vitamin B12 deficiency anemia, unspecified: Secondary | ICD-10-CM

## 2018-07-05 MED ORDER — CYANOCOBALAMIN 1000 MCG/ML IJ SOLN
1000.0000 ug | Freq: Once | INTRAMUSCULAR | Status: AC
  Administered 2018-07-05: 1000 ug via INTRAMUSCULAR

## 2018-07-05 NOTE — Progress Notes (Signed)
I have reviewed and agree.

## 2018-08-06 ENCOUNTER — Ambulatory Visit: Payer: Medicare Other

## 2018-08-07 ENCOUNTER — Ambulatory Visit (INDEPENDENT_AMBULATORY_CARE_PROVIDER_SITE_OTHER): Payer: Medicare Other

## 2018-08-07 DIAGNOSIS — E538 Deficiency of other specified B group vitamins: Secondary | ICD-10-CM

## 2018-08-07 MED ORDER — CYANOCOBALAMIN 1000 MCG/ML IJ SOLN
1000.0000 ug | Freq: Once | INTRAMUSCULAR | Status: AC
  Administered 2018-08-07: 1000 ug via INTRAMUSCULAR

## 2018-08-07 NOTE — Progress Notes (Signed)
I have reviewed and agree.

## 2018-11-27 DIAGNOSIS — Z0120 Encounter for dental examination and cleaning without abnormal findings: Secondary | ICD-10-CM | POA: Diagnosis not present

## 2018-12-25 DIAGNOSIS — N400 Enlarged prostate without lower urinary tract symptoms: Secondary | ICD-10-CM | POA: Diagnosis not present

## 2018-12-26 LAB — PSA: PSA: 6.08

## 2018-12-27 ENCOUNTER — Telehealth: Payer: Self-pay

## 2018-12-27 NOTE — Telephone Encounter (Signed)
Informed spouse PA was approved. Also informed he was due for a follow up appt with Dr. Ronnald Ramp. Spouse made appointment for pt.

## 2018-12-27 NOTE — Telephone Encounter (Signed)
Key: [id number]

## 2018-12-28 DIAGNOSIS — N400 Enlarged prostate without lower urinary tract symptoms: Secondary | ICD-10-CM | POA: Diagnosis not present

## 2018-12-28 DIAGNOSIS — R972 Elevated prostate specific antigen [PSA]: Secondary | ICD-10-CM | POA: Diagnosis not present

## 2018-12-31 ENCOUNTER — Encounter: Payer: Self-pay | Admitting: Internal Medicine

## 2018-12-31 ENCOUNTER — Other Ambulatory Visit (INDEPENDENT_AMBULATORY_CARE_PROVIDER_SITE_OTHER): Payer: Medicare Other

## 2018-12-31 ENCOUNTER — Other Ambulatory Visit: Payer: Self-pay

## 2018-12-31 ENCOUNTER — Ambulatory Visit (INDEPENDENT_AMBULATORY_CARE_PROVIDER_SITE_OTHER): Payer: Medicare Other | Admitting: Internal Medicine

## 2018-12-31 VITALS — BP 170/102 | HR 87 | Temp 98.0°F | Resp 16 | Ht 72.0 in | Wt 178.0 lb

## 2018-12-31 DIAGNOSIS — I10 Essential (primary) hypertension: Secondary | ICD-10-CM

## 2018-12-31 DIAGNOSIS — E559 Vitamin D deficiency, unspecified: Secondary | ICD-10-CM

## 2018-12-31 DIAGNOSIS — D519 Vitamin B12 deficiency anemia, unspecified: Secondary | ICD-10-CM

## 2018-12-31 DIAGNOSIS — E7800 Pure hypercholesterolemia, unspecified: Secondary | ICD-10-CM

## 2018-12-31 LAB — CBC WITH DIFFERENTIAL/PLATELET
Basophils Absolute: 0 10*3/uL (ref 0.0–0.1)
Basophils Relative: 0.5 % (ref 0.0–3.0)
Eosinophils Absolute: 0.1 10*3/uL (ref 0.0–0.7)
Eosinophils Relative: 1.5 % (ref 0.0–5.0)
HCT: 42.9 % (ref 39.0–52.0)
Hemoglobin: 14.8 g/dL (ref 13.0–17.0)
Lymphocytes Relative: 18.9 % (ref 12.0–46.0)
Lymphs Abs: 1.4 10*3/uL (ref 0.7–4.0)
MCHC: 34.6 g/dL (ref 30.0–36.0)
MCV: 95.1 fl (ref 78.0–100.0)
Monocytes Absolute: 0.8 10*3/uL (ref 0.1–1.0)
Monocytes Relative: 11.2 % (ref 3.0–12.0)
Neutro Abs: 5.2 10*3/uL (ref 1.4–7.7)
Neutrophils Relative %: 67.9 % (ref 43.0–77.0)
Platelets: 237 10*3/uL (ref 150.0–400.0)
RBC: 4.51 Mil/uL (ref 4.22–5.81)
RDW: 13 % (ref 11.5–15.5)
WBC: 7.6 10*3/uL (ref 4.0–10.5)

## 2018-12-31 NOTE — Progress Notes (Signed)
Subjective:  Patient ID: Samuel Fisher, male    DOB: 12-10-1945  Age: 73 y.o. MRN: [id number]  CC: Hypertension   HPI Samuel Fisher presents for f/up - He is concerned that his blood pressure is not adequately well controlled.  About a month or 2 ago he stopped taking Edarbyclor because he thought it caused anxiety and fatigue.  He saw a urologist about 5 days ago and was told that his systolic blood pressure was 170.  He has since restarted the Edarbyclor over the last 2 days.  He denies experiencing any recent episodes of headache, blurred vision, chest pain, shortness of breath, or dyspnea on exertion.  Outpatient Medications Prior to Visit  Medication Sig Dispense Refill  . aspirin 325 MG tablet Take 325 mg by mouth every 4 (four) hours as needed for moderate pain or headache.     . Azilsartan-Chlorthalidone (EDARBYCLOR) 40-12.5 MG TABS Take 1 tablet by mouth daily. 90 tablet 1  . Cholecalciferol 2000 units TABS Take 1 tablet (2,000 Units total) by mouth daily. 90 tablet 1  . fluticasone (FLONASE) 50 MCG/ACT nasal spray Place 1-2 sprays into both nostrils daily as needed. allergies  2  . Artificial Tear Ointment (AKWA TEARS) 08-04-81 % OINT Place 1 application into the right eye 3 times daily.    Marland Kitchen amoxicillin (AMOXIL) 500 MG capsule Take 2,000 mg by mouth once as needed (for dentist appointment).    . EPINEPHrine 0.3 mg/0.3 mL IJ SOAJ injection Inject 0.3 mLs (0.3 mg total) into the muscle once. (Patient not taking: Reported on 12/31/2018) 2 Device 0   No facility-administered medications prior to visit.     ROS Review of Systems  Constitutional: Negative.  Negative for diaphoresis and fatigue.  HENT: Negative.   Eyes: Negative for visual disturbance.  Respiratory: Negative for cough, chest tightness, shortness of breath and wheezing.   Cardiovascular: Negative for chest pain, palpitations and leg swelling.  Gastrointestinal: Negative for abdominal pain, constipation,  diarrhea, nausea and vomiting.  Genitourinary: Negative.  Negative for difficulty urinating, dysuria and hematuria.  Musculoskeletal: Negative for arthralgias, myalgias and neck pain.  Skin: Negative.  Negative for color change.  Neurological: Negative.  Negative for dizziness, weakness and light-headedness.  Hematological: Negative for adenopathy. Does not bruise/bleed easily.  Psychiatric/Behavioral: Negative.     Objective:  BP (!) 170/102 (BP Location: Left Arm, Patient Position: Sitting, Cuff Size: Normal)   Pulse 87   Temp 98 F (36.7 C) (Oral)   Resp 16   Ht 6' (1.829 m)   Wt 178 lb (80.7 kg)   SpO2 98%   BMI 24.14 kg/m   BP Readings from Last 3 Encounters:  12/31/18 (!) 170/102  01/15/18 (!) 160/86  07/29/17 136/80    Wt Readings from Last 3 Encounters:  12/31/18 178 lb (80.7 kg)  01/15/18 182 lb (82.6 kg)  07/29/17 180 lb 4 oz (81.8 kg)    Physical Exam Vitals signs reviewed.  Constitutional:      Appearance: He is not ill-appearing or diaphoretic.  HENT:     Nose: Nose normal.     Mouth/Throat:     Mouth: Mucous membranes are moist.     Pharynx: No oropharyngeal exudate.  Eyes:     General: No scleral icterus.    Conjunctiva/sclera: Conjunctivae normal.  Neck:     Musculoskeletal: Normal range of motion. No neck rigidity.  Cardiovascular:     Rate and Rhythm: Normal rate and regular rhythm.  Heart sounds: No murmur.  Pulmonary:     Effort: Pulmonary effort is normal.     Breath sounds: No stridor. No wheezing, rhonchi or rales.  Abdominal:     General: Abdomen is flat. Bowel sounds are normal. There is no distension.     Palpations: There is no hepatomegaly or splenomegaly.     Tenderness: There is no abdominal tenderness.  Musculoskeletal: Normal range of motion.     Right lower leg: No edema.     Left lower leg: No edema.  Lymphadenopathy:     Cervical: No cervical adenopathy.  Skin:    General: Skin is warm.  Neurological:     Mental  Status: He is alert and oriented to person, place, and time. Mental status is at baseline.     Cranial Nerves: Cranial nerve deficit present.  Psychiatric:        Mood and Affect: Mood normal.        Behavior: Behavior normal.     Lab Results  Component Value Date   WBC 7.6 12/31/2018   HGB 14.8 12/31/2018   HCT 42.9 12/31/2018   PLT 237.0 12/31/2018   GLUCOSE 101 (H) 12/31/2018   CHOL 170 12/31/2018   TRIG 81.0 12/31/2018   HDL 78.30 12/31/2018   LDLCALC 75 12/31/2018   ALT 17 12/31/2018   AST 20 12/31/2018   NA 132 (L) 12/31/2018   K 4.2 12/31/2018   CL 95 (L) 12/31/2018   CREATININE 0.94 12/31/2018   BUN 14 12/31/2018   CO2 27 12/31/2018   TSH 1.88 12/31/2018   PSA 6.08 12/26/2018    Mr Brain W Wo Contrast  Result Date: 12/30/2015 CLINICAL DATA:  Dizziness, RIGHT-sided hearing loss. Two falls today. History of hypertension, LEFT hearing loss and inner ear dysfunction. EXAM: MRI HEAD WITHOUT AND WITH CONTRAST TECHNIQUE: Multiplanar, multiecho pulse sequences of the brain and surrounding structures were obtained without and with intravenous contrast. CONTRAST:  43mL MULTIHANCE GADOBENATE DIMEGLUMINE 529 MG/ML IV SOLN COMPARISON:  None. FINDINGS: INTRACRANIAL CONTENTS: 2.5 x 4.5 cm (transverse by AP) RIGHT cerebellar pontine angle cystic mass with enhancing septations, and thin mural enhancement extends into the RIGHT cerebellar pontine angle (bright T2 FLAIR). Mass effect on the subjacent brachium pontis without parenchymal edema or enhancement to suggest invasion. A second cystic 12 x 19 mm mass in RIGHT Meckel's cave with low T2 FLAIR signal. Moderate ventriculomegaly on the basis of global parenchymal brain volume loss. No reduced diffusion to suggest acute ischemia or hypercellular tumor. Linear flow voids within the primary CP angle mass. No susceptibility artifact to suggest hemorrhage. No abnormal parenchymal enhancement. Mild white matter changes most compatible chronic  small vessel ischemic disease, less than expected for age. No abnormal extra-axial fluid collections. Normal major intracranial vascular flow voids present at skull base. ORBITS: The included ocular globes and orbital contents are non-suspicious. SINUSES: Trace paranasal sinus mucosal thickening. Trace LEFT mastoid effusion. SKULL/SOFT TISSUES: No abnormal sellar expansion. No suspicious calvarial bone marrow signal. Craniocervical junction maintained. 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 12 x 19 mm cystic mass in RIGHT Meckel's cave with slightly different imaging characteristics suggesting obstruction and accumulation of CSF, possible arachnoid cyst versus second schwannoma. Moderate global brain atrophy, mildly advanced for age. Mild chronic small vessel ischemic disease. Electronically Signed   By: Elon Alas M.D.   On: 12/30/2015 22:25    Assessment &  Plan:   Dedric was seen today for hypertension.  Diagnoses and all orders for this visit:  Essential hypertension, benign- His blood pressure is not adequately well controlled due to noncompliance.  He has chronic, stable, asymptomatic hyponatremia.  He will restart Edarbyclor and I will continue to monitor his blood pressure and lytes closely. -     Basic metabolic panel; Future -     TSH; Future -     Urinalysis, Routine w reflex microscopic; Future  Anemia due to vitamin B12 deficiency, unspecified B12 deficiency type- His B12 level is low.  I have asked him to start receiving monthly B12 injections. -     Vitamin B12; Future -     CBC with Differential/Platelet; Future -     Folate; Future  Vitamin D deficiency- His vitamin D level is normal now. -     VITAMIN D 25 Hydroxy (Vit-D Deficiency, Fractures); Future  Pure hypercholesterolemia- He has an elevated ASCVD risk score so I have asked him to start taking a  statin for CV risk reduction. -     Lipid panel; Future -     TSH; Future -     Hepatic function panel; Future -     rosuvastatin (CRESTOR) 5 MG tablet; Take 1 tablet (5 mg total) by mouth daily.   I have discontinued Dymir B. Boyack's Akwa Tears. I am also having him start on rosuvastatin. Additionally, I am having him maintain his aspirin, EPINEPHrine, amoxicillin, fluticasone, Azilsartan-Chlorthalidone, and Cholecalciferol.  Meds ordered this encounter  Medications  . rosuvastatin (CRESTOR) 5 MG tablet    Sig: Take 1 tablet (5 mg total) by mouth daily.    Dispense:  90 tablet    Refill:  1     Follow-up: Return in about 4 weeks (around 01/28/2019).  Scarlette Calico, MD

## 2018-12-31 NOTE — Patient Instructions (Signed)

## 2019-01-01 ENCOUNTER — Encounter: Payer: Self-pay | Admitting: Internal Medicine

## 2019-01-01 LAB — BASIC METABOLIC PANEL
BUN: 14 mg/dL (ref 6–23)
CO2: 27 mEq/L (ref 19–32)
Calcium: 10.2 mg/dL (ref 8.4–10.5)
Chloride: 95 mEq/L — ABNORMAL LOW (ref 96–112)
Creatinine, Ser: 0.94 mg/dL (ref 0.40–1.50)
GFR: 79.04 mL/min (ref >60.00)
Glucose, Bld: 101 mg/dL — ABNORMAL HIGH (ref 70–99)
Potassium: 4.2 mEq/L (ref 3.5–5.1)
Sodium: 132 mEq/L — ABNORMAL LOW (ref 135–145)

## 2019-01-01 LAB — HEPATIC FUNCTION PANEL
ALT: 17 U/L (ref 0–53)
AST: 20 U/L (ref 0–37)
Albumin: 5 g/dL (ref 3.5–5.2)
Alkaline Phosphatase: 54 U/L (ref 39–117)
Bilirubin, Direct: 0.1 mg/dL (ref 0.0–0.3)
Total Bilirubin: 0.7 mg/dL (ref 0.2–1.2)
Total Protein: 7.6 g/dL (ref 6.0–8.3)

## 2019-01-01 LAB — URINALYSIS, ROUTINE W REFLEX MICROSCOPIC
Bilirubin Urine: NEGATIVE
Hgb urine dipstick: NEGATIVE
Ketones, ur: NEGATIVE
Leukocytes,Ua: NEGATIVE
Nitrite: NEGATIVE
RBC / HPF: NONE SEEN (ref 0–?)
Specific Gravity, Urine: 1.015 (ref 1.000–1.030)
Total Protein, Urine: NEGATIVE
Urine Glucose: NEGATIVE
Urobilinogen, UA: 0.2 (ref 0.0–1.0)
WBC, UA: NONE SEEN (ref 0–?)
pH: 6.5 (ref 5.0–8.0)

## 2019-01-01 LAB — VITAMIN D 25 HYDROXY (VIT D DEFICIENCY, FRACTURES): VITD: 49.07 ng/mL (ref 30.00–100.00)

## 2019-01-01 LAB — LIPID PANEL
Cholesterol: 170 mg/dL (ref 0–200)
HDL: 78.3 mg/dL (ref >39.00)
LDL Cholesterol: 75 mg/dL (ref 0–99)
NonHDL: 91.36
Total CHOL/HDL Ratio: 2
Triglycerides: 81 mg/dL (ref 0.0–149.0)
VLDL: 16.2 mg/dL (ref 0.0–40.0)

## 2019-01-01 LAB — TSH: TSH: 1.88 u[IU]/mL (ref 0.35–4.50)

## 2019-01-01 LAB — VITAMIN B12: Vitamin B-12: 148 pg/mL — ABNORMAL LOW (ref 211–911)

## 2019-01-01 LAB — FOLATE: Folate: 15.7 ng/mL (ref >5.9)

## 2019-01-01 MED ORDER — ROSUVASTATIN CALCIUM 5 MG PO TABS: 5.0000 mg | ORAL_TABLET | Freq: Every day | ORAL | 1 refills | Status: DC

## 2019-01-03 ENCOUNTER — Encounter: Payer: Self-pay | Admitting: *Deleted

## 2019-01-03 ENCOUNTER — Ambulatory Visit (INDEPENDENT_AMBULATORY_CARE_PROVIDER_SITE_OTHER): Payer: Medicare Other | Admitting: *Deleted

## 2019-01-03 ENCOUNTER — Other Ambulatory Visit: Payer: Self-pay

## 2019-01-03 DIAGNOSIS — E538 Deficiency of other specified B group vitamins: Secondary | ICD-10-CM | POA: Diagnosis not present

## 2019-01-03 MED ORDER — CYANOCOBALAMIN 1000 MCG/ML IJ SOLN
1000.0000 ug | Freq: Once | INTRAMUSCULAR | Status: AC
  Administered 2019-01-03: 1000 ug via INTRAMUSCULAR

## 2019-01-03 NOTE — Progress Notes (Signed)
I have reviewed and agree.

## 2019-01-25 ENCOUNTER — Other Ambulatory Visit: Payer: Self-pay | Admitting: Internal Medicine

## 2019-01-25 DIAGNOSIS — I10 Essential (primary) hypertension: Secondary | ICD-10-CM

## 2019-02-04 ENCOUNTER — Other Ambulatory Visit: Payer: Self-pay

## 2019-02-04 DIAGNOSIS — Z20822 Contact with and (suspected) exposure to covid-19: Secondary | ICD-10-CM

## 2019-02-06 ENCOUNTER — Telehealth: Payer: Self-pay | Admitting: *Deleted

## 2019-02-06 LAB — NOVEL CORONAVIRUS, NAA: SARS-CoV-2, NAA: NOT DETECTED

## 2019-02-06 NOTE — Telephone Encounter (Signed)
Reviewed covid results with patient. No questions asked.

## 2019-02-08 DIAGNOSIS — H524 Presbyopia: Secondary | ICD-10-CM | POA: Diagnosis not present

## 2019-02-13 ENCOUNTER — Ambulatory Visit (INDEPENDENT_AMBULATORY_CARE_PROVIDER_SITE_OTHER): Payer: Medicare Other

## 2019-02-13 ENCOUNTER — Other Ambulatory Visit: Payer: Self-pay

## 2019-02-13 DIAGNOSIS — E538 Deficiency of other specified B group vitamins: Secondary | ICD-10-CM

## 2019-02-13 MED ORDER — CYANOCOBALAMIN 1000 MCG/ML IJ SOLN
1000.0000 ug | Freq: Once | INTRAMUSCULAR | Status: AC
  Administered 2019-02-13: 16:00:00 1000 ug via INTRAMUSCULAR

## 2019-02-13 NOTE — Progress Notes (Signed)
I have reviewed and agree.

## 2019-03-18 ENCOUNTER — Ambulatory Visit (INDEPENDENT_AMBULATORY_CARE_PROVIDER_SITE_OTHER): Payer: Medicare Other

## 2019-03-18 DIAGNOSIS — Z23 Encounter for immunization: Secondary | ICD-10-CM

## 2019-03-18 DIAGNOSIS — E538 Deficiency of other specified B group vitamins: Secondary | ICD-10-CM | POA: Diagnosis not present

## 2019-03-18 MED ORDER — CYANOCOBALAMIN 1000 MCG/ML IJ SOLN
1000.0000 ug | Freq: Once | INTRAMUSCULAR | Status: AC
  Administered 2019-03-18: 1000 ug via INTRAMUSCULAR

## 2019-03-18 NOTE — Progress Notes (Signed)
I have reviewed and agree.

## 2019-04-26 ENCOUNTER — Other Ambulatory Visit: Payer: Self-pay | Admitting: Internal Medicine

## 2019-04-26 DIAGNOSIS — I10 Essential (primary) hypertension: Secondary | ICD-10-CM

## 2019-04-26 MED ORDER — EDARBYCLOR 40-12.5 MG PO TABS: 1.0000 | ORAL_TABLET | Freq: Every day | ORAL | 2 refills | Status: DC

## 2019-04-26 NOTE — Telephone Encounter (Signed)
Patient requesting EDARBYCLOR 40-12.5 MG TABS , informed patient please allow 48 to 72 hour turn around.  [place], [place] [phone] (Phone) [phone] (Fax)

## 2019-05-28 ENCOUNTER — Ambulatory Visit (INDEPENDENT_AMBULATORY_CARE_PROVIDER_SITE_OTHER): Payer: Medicare Other | Admitting: Internal Medicine

## 2019-05-28 ENCOUNTER — Other Ambulatory Visit (INDEPENDENT_AMBULATORY_CARE_PROVIDER_SITE_OTHER): Payer: Medicare Other

## 2019-05-28 ENCOUNTER — Encounter: Payer: Self-pay | Admitting: Internal Medicine

## 2019-05-28 ENCOUNTER — Ambulatory Visit (INDEPENDENT_AMBULATORY_CARE_PROVIDER_SITE_OTHER)
Admission: RE | Admit: 2019-05-28 | Discharge: 2019-05-28 | Disposition: A | Discharge status: Home / Self Care | Payer: Medicare Other | Source: Ambulatory Visit | Attending: Internal Medicine | Admitting: Internal Medicine

## 2019-05-28 ENCOUNTER — Ambulatory Visit: Payer: Medicare Other | Admitting: Internal Medicine

## 2019-05-28 ENCOUNTER — Other Ambulatory Visit: Payer: Self-pay

## 2019-05-28 VITALS — BP 164/80 | HR 91 | Temp 98.1°F | Resp 16 | Ht 72.0 in | Wt 185.3 lb

## 2019-05-28 DIAGNOSIS — E871 Hypo-osmolality and hyponatremia: Secondary | ICD-10-CM | POA: Diagnosis not present

## 2019-05-28 DIAGNOSIS — M25511 Pain in right shoulder: Secondary | ICD-10-CM | POA: Diagnosis not present

## 2019-05-28 DIAGNOSIS — R972 Elevated prostate specific antigen [PSA]: Secondary | ICD-10-CM | POA: Diagnosis not present

## 2019-05-28 DIAGNOSIS — J90 Pleural effusion, not elsewhere classified: Secondary | ICD-10-CM

## 2019-05-28 DIAGNOSIS — I10 Essential (primary) hypertension: Secondary | ICD-10-CM

## 2019-05-28 DIAGNOSIS — G8929 Other chronic pain: Secondary | ICD-10-CM

## 2019-05-28 DIAGNOSIS — I2510 Atherosclerotic heart disease of native coronary artery without angina pectoris: Secondary | ICD-10-CM

## 2019-05-28 DIAGNOSIS — M19111 Post-traumatic osteoarthritis, right shoulder: Secondary | ICD-10-CM

## 2019-05-28 LAB — BASIC METABOLIC PANEL
BUN: 16 mg/dL (ref 6–23)
CO2: 28 mEq/L (ref 19–32)
Calcium: 9.7 mg/dL (ref 8.4–10.5)
Chloride: 98 mEq/L (ref 96–112)
Creatinine, Ser: 0.97 mg/dL (ref 0.40–1.50)
GFR: 76.14 mL/min (ref >60.00)
Glucose, Bld: 109 mg/dL — ABNORMAL HIGH (ref 70–99)
Potassium: 3.7 mEq/L (ref 3.5–5.1)
Sodium: 132 mEq/L — ABNORMAL LOW (ref 135–145)

## 2019-05-28 LAB — CORTISOL: Cortisol, Plasma: 9.2 ug/dL

## 2019-05-28 LAB — PSA: PSA: 7.8

## 2019-05-28 NOTE — Progress Notes (Signed)
Subjective:  Patient ID: NYXON SHERE, male    DOB: May 10, 1946  Age: 73 y.o. MRN: [id number]  CC: Hypertension, Shoulder Pain, and Osteoarthritis  This visit occurred during the SARS-CoV-2 public health emergency.  Safety protocols were in place, including screening questions prior to the visit, additional usage of staff PPE, and extensive cleaning of exam room while observing appropriate contact time as indicated for disinfecting solutions.    HPI CURRAN SHVARTS presents for f/up - He brings in a blood pressure log that covers the last 5 months.  At home he does not have a single blood pressure that is over 130/80.  He feels like he gets nervous when he comes in to see me and that elevates the blood pressure while he is in the office.  He complains of right shoulder pain for 3 years.  He tells me he had a syncopal episode 3 years ago and injured his right shoulder.  He tells me it was never x-rayed.  Over the last year he has had a decrease in the range of motion.  He wants to do some physical therapy.  Outpatient Medications Prior to Visit  Medication Sig Dispense Refill  . aspirin 325 MG tablet Take 325 mg by mouth every 4 (four) hours as needed for moderate pain or headache.     . Azilsartan-Chlorthalidone (EDARBYCLOR) 40-12.5 MG TABS Take 1 tablet by mouth daily. 30 tablet 2  . Cholecalciferol 2000 units TABS Take 1 tablet (2,000 Units total) by mouth daily. 90 tablet 1  . EPINEPHrine 0.3 mg/0.3 mL IJ SOAJ injection Inject 0.3 mLs (0.3 mg total) into the muscle once. 2 Device 0  . fluticasone (FLONASE) 50 MCG/ACT nasal spray Place 1-2 sprays into both nostrils daily as needed. allergies  2  . rosuvastatin (CRESTOR) 5 MG tablet Take 1 tablet (5 mg total) by mouth daily. 90 tablet 1  . amoxicillin (AMOXIL) 500 MG capsule Take 2,000 mg by mouth once as needed (for dentist appointment).     No facility-administered medications prior to visit.    ROS Review of Systems   Constitutional: Negative.  Negative for fatigue and unexpected weight change.  HENT: Negative.   Eyes: Negative for visual disturbance.  Respiratory: Negative for cough, chest tightness, shortness of breath and wheezing.   Cardiovascular: Negative for chest pain, palpitations and leg swelling.  Gastrointestinal: Negative for abdominal pain, constipation, diarrhea, nausea and vomiting.  Endocrine: Negative.  Negative for polyuria.  Genitourinary: Negative.  Negative for difficulty urinating, frequency and hematuria.  Musculoskeletal: Positive for arthralgias. Negative for back pain and myalgias.  Skin: Negative.  Negative for color change and pallor.  Neurological: Positive for facial asymmetry. Negative for dizziness, weakness and light-headedness.  Hematological: Negative for adenopathy. Does not bruise/bleed easily.  Psychiatric/Behavioral: Negative.     Objective:  BP (!) 164/80 (BP Location: Left Arm, Patient Position: Sitting, Cuff Size: Normal)   Pulse 91   Temp 98.1 F (36.7 C) (Oral)   Resp 16   Ht 6' (1.829 m)   Wt 185 lb 5 oz (84.1 kg)   SpO2 98%   BMI 25.13 kg/m   BP Readings from Last 3 Encounters:  05/28/19 (!) 164/80  12/31/18 (!) 170/102  01/15/18 (!) 160/86    Wt Readings from Last 3 Encounters:  05/28/19 185 lb 5 oz (84.1 kg)  12/31/18 178 lb (80.7 kg)  01/15/18 182 lb (82.6 kg)    Physical Exam Vitals reviewed.  Constitutional:  Appearance: Normal appearance.  HENT:     Nose: Nose normal.     Mouth/Throat:     Mouth: Mucous membranes are moist.  Eyes:     General: No scleral icterus.    Conjunctiva/sclera: Conjunctivae normal.  Cardiovascular:     Rate and Rhythm: Normal rate and regular rhythm.     Heart sounds: No murmur.  Pulmonary:     Effort: Pulmonary effort is normal.     Breath sounds: No stridor. No wheezing, rhonchi or rales.  Abdominal:     General: Abdomen is flat.     Palpations: There is no mass.     Tenderness: There is  no abdominal tenderness.  Musculoskeletal:        General: Normal range of motion.     Right shoulder: Bony tenderness present. No swelling or deformity.     Left shoulder: Normal.     Cervical back: Neck supple.     Right lower leg: No edema.     Left lower leg: No edema.  Lymphadenopathy:     Cervical: No cervical adenopathy.  Skin:    General: Skin is warm and dry.  Neurological:     General: No focal deficit present.     Mental Status: He is alert.  Psychiatric:        Mood and Affect: Mood normal.     Lab Results  Component Value Date   WBC 7.6 12/31/2018   HGB 14.8 12/31/2018   HCT 42.9 12/31/2018   PLT 237.0 12/31/2018   GLUCOSE 109 (H) 05/28/2019   CHOL 170 12/31/2018   TRIG 81.0 12/31/2018   HDL 78.30 12/31/2018   LDLCALC 75 12/31/2018   ALT 17 12/31/2018   AST 20 12/31/2018   NA 132 (L) 05/28/2019   K 3.7 05/28/2019   CL 98 05/28/2019   CREATININE 0.97 05/28/2019   BUN 16 05/28/2019   CO2 28 05/28/2019   TSH 1.88 12/31/2018   PSA 7.8 05/28/2019    Mr Brain W Wo Contrast  Result Date: 12/30/2015 CLINICAL DATA:  Dizziness, RIGHT-sided hearing loss. Two falls today. History of hypertension, LEFT hearing loss and inner ear dysfunction. EXAM: MRI HEAD WITHOUT AND WITH CONTRAST TECHNIQUE: Multiplanar, multiecho pulse sequences of the brain and surrounding structures were obtained without and with intravenous contrast. CONTRAST:  64mL MULTIHANCE GADOBENATE DIMEGLUMINE 529 MG/ML IV SOLN COMPARISON:  None. FINDINGS: INTRACRANIAL CONTENTS: 2.5 x 4.5 cm (transverse by AP) RIGHT cerebellar pontine angle cystic mass with enhancing septations, and thin mural enhancement extends into the RIGHT cerebellar pontine angle (bright T2 FLAIR). Mass effect on the subjacent brachium pontis without parenchymal edema or enhancement to suggest invasion. A second cystic 12 x 19 mm mass in RIGHT Meckel's cave with low T2 FLAIR signal. Moderate ventriculomegaly on the basis of global  parenchymal brain volume loss. No reduced diffusion to suggest acute ischemia or hypercellular tumor. Linear flow voids within the primary CP angle mass. No susceptibility artifact to suggest hemorrhage. No abnormal parenchymal enhancement. Mild white matter changes most compatible chronic small vessel ischemic disease, less than expected for age. No abnormal extra-axial fluid collections. Normal major intracranial vascular flow voids present at skull base. ORBITS: The included ocular globes and orbital contents are non-suspicious. SINUSES: Trace paranasal sinus mucosal thickening. Trace LEFT mastoid effusion. SKULL/SOFT TISSUES: No abnormal sellar expansion. No suspicious calvarial bone marrow signal. Craniocervical junction maintained. IMPRESSION: 2.5 x 4.5 cm RIGHT cerebellar pontine angle mass extending into the RIGHT IAC likely representing cystic  schwannoma, less likely metastasis. 12 x 19 mm cystic mass in RIGHT Meckel's cave with slightly different imaging characteristics suggesting obstruction and accumulation of CSF, possible arachnoid cyst versus second schwannoma. Moderate global brain atrophy, mildly advanced for age. Mild chronic small vessel ischemic disease. Electronically Signed   By: Elon Alas M.D.   On: 12/30/2015 22:25   DG Chest 2 View  Result Date: 05/28/2019 CLINICAL DATA:  Chronic hyponatremia. Routine checkup. EXAM: CHEST - 2 VIEW COMPARISON:  None. FINDINGS: The cardiomediastinal silhouette is unremarkable. A trace RIGHT pleural effusion versus pleural thickening is noted. There is no evidence of focal airspace disease, pulmonary edema, suspicious pulmonary nodule/mass, or pneumothorax. No acute bony abnormalities are identified. IMPRESSION: 1. Trace RIGHT pleural effusion versus pleural thickening. 2. No other significant abnormalities. Electronically Signed   By: Margarette Canada M.D.   On: 05/28/2019 17:17   DG Shoulder Right  Result Date: 05/28/2019 CLINICAL DATA:  Chronic  RIGHT shoulder pain. No known injury. Initial encounter. EXAM: RIGHT SHOULDER - 2+ VIEW COMPARISON:  None. FINDINGS: Moderate glenohumeral joint space narrowing with osteophytosis and juxta-articular cystic changes noted. No acute fracture, subluxation or dislocation identified. Mild AC joint degenerative changes are present. Remote RIGHT rib fractures are identified. IMPRESSION: No acute abnormality. Moderate glenohumeral joint degenerative changes. Electronically Signed   By: Margarette Canada M.D.   On: 05/28/2019 17:15     Assessment & Plan:   Jameis was seen today for hypertension, shoulder pain and osteoarthritis.  Diagnoses and all orders for this visit:  Chronic right shoulder pain - Plain films are consistent with OA.  I referred him to physical therapy. -     DG Shoulder Right; Future -     Ambulatory referral to Physical Therapy  Essential hypertension, benign- His blood pressure is elevated here but it is consistently normal at home.  I think he has a whitecoat phenomenon.  I think his blood pressure is actually well controlled. -     Basic metabolic panel; Future  PSA elevation- He has a rising PSA.  I recommended that he see urology to see if he needs to be screened for prostate cancer. -     PSA, total and free; Future -     Ambulatory referral to Urology  Chronic hyponatremia- He has chronic stable hyponatremia.  He is taking a thiazide diuretic.  His UA is positive for a normal sodium and chloride levels.  His cortisol level is normal.  I will screen him for hyperaldosteronism.  This is all consistent with SIADH.  He has a mildly abnormal chest x-ray so of asked him to go undergo a CT of the chest to be sure that he does not have a pulmonary lesion contributing to the SIADH. -     Aldosterone + renin activity w/ ratio; Future -     Cortisol; Future -     Sodium, urine, random; Future -     Chloride, urine, random; Future -     DG Chest 2 View; Future  Post-traumatic  osteoarthritis, right shoulder -     Ambulatory referral to Physical Therapy  Pleural effusion, right -     CT Chest W Contrast; Future  Pleural effusion, not elsewhere classified -     CT Chest W Contrast; Future   I am having Roderic Palau B. Gargan maintain his aspirin, EPINEPHrine, amoxicillin, fluticasone, Cholecalciferol, rosuvastatin, and Edarbyclor.  No orders of the defined types were placed in this encounter.    Follow-up: Return  in about 3 months (around 08/26/2019).  Scarlette Calico, MD

## 2019-05-28 NOTE — Patient Instructions (Signed)
Shoulder Pain Many things can cause shoulder pain, including:  An injury to the shoulder.  Overuse of the shoulder.  Arthritis. The source of the pain can be:  Inflammation.  An injury to the shoulder joint.  An injury to a tendon, ligament, or bone. Follow these instructions at home: Pay attention to changes in your symptoms. Let your health care provider know about them. Follow these instructions to relieve your pain. If you have a sling:  Wear the sling as told by your health care provider. Remove it only as told by your health care provider.  Loosen the sling if your fingers tingle, become numb, or turn cold and blue.  Keep the sling clean.  If the sling is not waterproof: ? Do not let it get wet. Remove it to shower or bathe.  Move your arm as little as possible, but keep your hand moving to prevent swelling. Managing pain, stiffness, and swelling   If directed, put ice on the painful area: ? Put ice in a plastic bag. ? Place a towel between your skin and the bag. ? Leave the ice on for 20 minutes, 2-3 times per day. Stop applying ice if it does not help with the pain.  Squeeze a soft ball or a foam pad as much as possible. This helps to keep the shoulder from swelling. It also helps to strengthen the arm. General instructions  Take over-the-counter and prescription medicines only as told by your health care provider.  Keep all follow-up visits as told by your health care provider. This is important. Contact a health care provider if:  Your pain gets worse.  Your pain is not relieved with medicines.  New pain develops in your arm, hand, or fingers. Get help right away if:  Your arm, hand, or fingers: ? Tingle. ? Become numb. ? Become swollen. ? Become painful. ? Turn white or blue. Summary  Shoulder pain can be caused by an injury, overuse, or arthritis.  Pay attention to changes in your symptoms. Let your health care provider know about them.   This condition may be treated with a sling, ice, and pain medicines.  Contact your health care provider if the pain gets worse or new pain develops. Get help right away if your arm, hand, or fingers tingle or become numb, swollen, or painful.  Keep all follow-up visits as told by your health care provider. This is important. This information is not intended to replace advice given to you by your health care provider. Make sure you discuss any questions you have with your health care provider. Document Released: 03/16/2005 Document Revised: 12/19/2017 Document Reviewed: 12/19/2017 [employer] Patient Education  2020 [employer]  

## 2019-05-29 ENCOUNTER — Encounter: Payer: Self-pay | Admitting: Internal Medicine

## 2019-05-29 DIAGNOSIS — J90 Pleural effusion, not elsewhere classified: Secondary | ICD-10-CM | POA: Insufficient documentation

## 2019-06-03 LAB — PSA, TOTAL AND FREE
PSA, % Free: 28 % (calc) (ref >25)
PSA, Free: 2.2 ng/mL
PSA, Total: 7.8 ng/mL — ABNORMAL HIGH (ref <=4.0)

## 2019-06-03 LAB — SODIUM, URINE, RANDOM: Sodium, Ur: 72 mmol/L (ref 28–272)

## 2019-06-03 LAB — ALDOSTERONE + RENIN ACTIVITY W/ RATIO
ALDO / PRA Ratio: 0.2 Ratio — ABNORMAL LOW (ref 0.9–28.9)
Aldosterone: 3 ng/dL
Renin Activity: 13.64 ng/mL/h — ABNORMAL HIGH (ref 0.25–5.82)

## 2019-06-03 LAB — CHLORIDE, URINE, RANDOM: Chloride Urine: 77 mmol/L (ref 32–290)

## 2019-06-04 DIAGNOSIS — Z0120 Encounter for dental examination and cleaning without abnormal findings: Secondary | ICD-10-CM | POA: Diagnosis not present

## 2019-06-05 ENCOUNTER — Ambulatory Visit (INDEPENDENT_AMBULATORY_CARE_PROVIDER_SITE_OTHER)
Admission: RE | Admit: 2019-06-05 | Discharge: 2019-06-05 | Disposition: A | Discharge status: Home / Self Care | Payer: Medicare Other | Source: Ambulatory Visit | Attending: Internal Medicine | Admitting: Internal Medicine

## 2019-06-05 ENCOUNTER — Other Ambulatory Visit: Payer: Self-pay

## 2019-06-05 DIAGNOSIS — J90 Pleural effusion, not elsewhere classified: Secondary | ICD-10-CM

## 2019-06-05 DIAGNOSIS — J9811 Atelectasis: Secondary | ICD-10-CM | POA: Diagnosis not present

## 2019-06-05 MED ORDER — IOHEXOL 300 MG/ML  SOLN
80.0000 mL | Freq: Once | INTRAMUSCULAR | Status: AC | PRN
  Administered 2019-06-05: 16:00:00 80 mL via INTRAVENOUS

## 2019-06-06 ENCOUNTER — Other Ambulatory Visit: Payer: Self-pay | Admitting: Internal Medicine

## 2019-06-06 DIAGNOSIS — R1900 Intra-abdominal and pelvic swelling, mass and lump, unspecified site: Secondary | ICD-10-CM

## 2019-06-06 DIAGNOSIS — K862 Cyst of pancreas: Secondary | ICD-10-CM

## 2019-06-10 ENCOUNTER — Other Ambulatory Visit: Payer: Self-pay | Admitting: Internal Medicine

## 2019-06-10 DIAGNOSIS — I2510 Atherosclerotic heart disease of native coronary artery without angina pectoris: Secondary | ICD-10-CM | POA: Insufficient documentation

## 2019-06-18 ENCOUNTER — Ambulatory Visit: Payer: Medicare Other | Attending: Internal Medicine | Admitting: Physical Therapy

## 2019-06-18 ENCOUNTER — Encounter: Payer: Self-pay | Admitting: Physical Therapy

## 2019-06-18 ENCOUNTER — Other Ambulatory Visit: Payer: Self-pay

## 2019-06-18 DIAGNOSIS — M25611 Stiffness of right shoulder, not elsewhere classified: Secondary | ICD-10-CM | POA: Diagnosis not present

## 2019-06-18 DIAGNOSIS — M6281 Muscle weakness (generalized): Secondary | ICD-10-CM

## 2019-06-18 DIAGNOSIS — G8929 Other chronic pain: Secondary | ICD-10-CM

## 2019-06-18 DIAGNOSIS — M25511 Pain in right shoulder: Secondary | ICD-10-CM | POA: Diagnosis not present

## 2019-06-18 NOTE — Therapy (Signed)
[place], [place], [address] Phone: [phone]   Fax:  [phone]  Physical Therapy Evaluation  Patient Details  Name: Samuel Fisher MRN: [id number] Date of Birth: [birth year] Referring Provider (PT): [name], MD   Encounter Date: 06/18/2019  PT End of Session - 06/18/19 1101    Visit Number  1    Number of Visits  8    Date for PT Re-Evaluation  08/13/19    Authorization Type  [employer] MCR    PT Start Time  1046    PT Stop Time  1130    PT Time Calculation (min)  44 min    Activity Tolerance  Patient tolerated treatment well    Behavior During Therapy  [facility] for tasks assessed/performed       Past Medical History:  Diagnosis Date  . Allergy   . Arthritis   . BPH (benign prostatic hyperplasia) 2010  . Hypertension   . Inner ear dysfunction     Past Surgical History:  Procedure Laterality Date  . JOINT REPLACEMENT    . TONSILLECTOMY  1951  . TOTAL HIP ARTHROPLASTY     right-2002 and left-2004, 2010    There were no vitals filed for this visit.   Subjective Assessment - 06/18/19 1050    Subjective  Patient reports right shoulder pain over past few years. He had an incident where he passed out and landed on the right shoulder. He states it has been a minor injury since the traumatic incident until the past 5-6 months when it has been worsening. He has a lot of clicking and popping in the shoulder. He does go to the gym and works out consistently.    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  No limitation    How long can you stand comfortably?  No limitation    How long can you walk comfortably?  No limitation    Diagnostic tests  X-ray    Patient Stated Goals  Improve  function of right shoulder so he can reach overhead and behind back.    Currently in Pain?  Yes    Pain Score  2     Pain Location  Shoulder    Pain Orientation  Right    Pain Descriptors / Indicators  Sharp;Aching    Pain Type  Chronic pain    Pain Onset  More than a month ago    Pain Frequency  Intermittent    Aggravating Factors   Lifting the arm, reaching behind his back for dressing, lifting objects    Pain Relieving Factors  Medication (ibuprofen), topical dream    Effect of Pain on Daily Activities  Patient is limited in reaching over his head and dressing    Multiple Pain Sites  No         [facility] PT Assessment - 06/18/19 0001      Assessment   Medical Diagnosis  Chronic right shoulder pain    Referring Provider (PT)  [name], MD    Hand Dominance  Right      Precautions   Precautions  None      Restrictions   Weight Bearing Restrictions  No      Balance Screen   Has the  patient fallen in the past 6 months  No    Has the patient had a decrease in activity level because of a fear of falling?   No    Is the patient reluctant to leave their home because of a fear of falling?   No      Home Film/video editor residence    Living Arrangements  Spouse/significant other      Prior Function   Level of Independence  Independent    Leisure  Exercise      Cognition   Overall Cognitive Status  Within Functional Limits for tasks assessed      Observation/Other Assessments   Observations  Patient appears in no apparent distress    Focus on Therapeutic Outcomes (FOTO)   43% limitation      Sensation   Light Touch  Appears Intact      Posture/Postural Control   Posture Comments  Patient exhibits rounded shoulder and forward head posture, his right shoulder appears heigher than left      ROM / Strength   AROM / PROM / Strength  AROM;PROM;Strength      AROM   Overall AROM Comments  Cervical AROM WFL and non-painful    AROM Assessment  Site  Shoulder    Right/Left Shoulder  Right    Right Shoulder Flexion  110 Degrees   compensated scaption noted >90 deg   Right Shoulder ABduction  80 Degrees    Right Shoulder Internal Rotation  --   Sacrum   Right Shoulder External Rotation  --   Occiput     PROM   PROM Assessment Site  Shoulder    Right/Left Shoulder  Right    Right Shoulder Flexion  135 Degrees    Right Shoulder ABduction  100 Degrees    Right Shoulder External Rotation  50 Degrees      Strength   Overall Strength Comments  Strength asssessed within available range    Strength Assessment Site  Shoulder    Right/Left Shoulder  Right;Left    Right Shoulder Flexion  4-/5    Right Shoulder Extension  4-/5    Right Shoulder ABduction  4-/5    Right Shoulder Internal Rotation  4+/5    Right Shoulder External Rotation  4-/5    Right Shoulder Horizontal ABduction  4-/5    Left Shoulder Flexion  4+/5    Left Shoulder Extension  4+/5    Left Shoulder ABduction  4+/5    Left Shoulder Internal Rotation  5/5    Left Shoulder External Rotation  4+/5    Left Shoulder Horizontal ABduction  4/5      Palpation   Palpation comment  No TTP      Special Tests    Special Tests  Rotator Cuff Impingement    Rotator Cuff Impingment tests  Hornblowers Sign;Drop Arm test;Lag signs at 90 degrees;Full Can test      Hornblowers Sign   Findings  Positive    Comment  Increased pain and weakness      Full Can test   Findings  Positive    Comment  Increased pain and weakness      Lag signs at 90 degrees    Findings  Positive    Comments  Increased pain and weakness      Drop Arm test   Findings  Positive    Comment  Increased pain and weakness      Transfers  Transfers  Independent with all Transfers                Objective measurements completed on examination: See above findings.      [place] [facility] PT Treatment/Exercise - 06/18/19 0001      Exercises   Exercises  Shoulder      Shoulder Exercises:  Supine   Flexion  AAROM;10 reps    Flexion Limitations  dowel assisted      Shoulder Exercises: Sidelying   External Rotation  AROM;10 reps    ABduction  AROM;10 reps      Shoulder Exercises: Standing   Row  Strengthening;10 reps;Theraband    Theraband Level (Shoulder Row)  Level 1 (Yellow)    Row Limitations  max cueing for technique, avoid shrug             PT Education - 06/18/19 1100    Education Details  Exam findings, anatomy of condition, POC, HEP    Person(s) Educated  Patient    Methods  Explanation;Demonstration;Verbal cues;Handout;Tactile cues    Comprehension  Verbalized understanding;Returned demonstration;Verbal cues required;Need further instruction;Tactile cues required       PT Short Term Goals - 06/18/19 1156      PT SHORT TERM GOAL #1   Title  Patient will report </= 3-4/10 pain level with reaching overhead or behind back to improve dressing and grooming ability.    Baseline  5-6/10    Time  4    Period  Weeks    Status  New    Target Date  07/16/19      PT SHORT TERM GOAL #2   Title  Patient will exhibit improved ability to reach behind back to L5 in order to improve dressing ability.    Baseline  Sacrum    Time  4    Period  Weeks    Status  New    Target Date  07/16/19      PT SHORT TERM GOAL #3   Title  Patient will be independent with initial HEP to progress in PT.    Baseline  HEP given at eval    Time  4    Period  Weeks    Status  New    Target Date  07/16/19        PT Long Term Goals - 06/18/19 1159      PT LONG TERM GOAL #1   Title  Patient will report improved functional level of </= 31% limitation on FOTO.    Baseline  43% limitation    Time  8    Period  Weeks    Status  New    Target Date  08/13/19      PT LONG TERM GOAL #2   Title  Patient will report improved pain evel of </= 1-2/10 when reaching overhead or behind back so he can perform household tasks with no limitation.    Time  8    Period  Weeks    Status   New    Target Date  08/13/19      PT LONG TERM GOAL #3   Title  Patient will exhibit functional shoulder range of motion where he is able to perform all household and dressing/grooming tasks with no limitation.    Time  8    Period  Weeks    Status  New    Target Date  08/13/19  Plan - 06/18/19 1147    Clinical Impression Statement  Patient presents to PT with report of chronic right shoulder pain following a fall on to the right shoulder approximately 3 years ago. Currently he exhibit limited active motion and strength with pain and popping/catching with all movement. Based on his exam and symptoms his impairments may be a result of rotator cuff injury and arthritis that has been diagnosed via x-ray. He would benefit from continued skilled PT to progress his range of motion and strength as able to improve his ability to reach overhead and behind back with minimal limitation.    Personal Factors and Comorbidities  Age;Past/Current Experience;Time since onset of injury/illness/exacerbation    Examination-Activity Limitations  Bathing;Dressing;Hygiene/Grooming;Lift;Reach Overhead    Examination-Participation Restrictions  Yard Work;Cleaning;Community Activity    Stability/Clinical Decision Making  Evolving/Moderate complexity    Rehab Potential  Fair    PT Frequency  1x / week    PT Duration  8 weeks    PT Treatment/Interventions  ADLs/Self Care Home Management;Cryotherapy;Electrical Stimulation;Moist Heat;Therapeutic exercise;Therapeutic activities;Patient/family education;Manual techniques;Dry needling;Passive range of motion;Taping;Spinal Manipulations;Joint Manipulations    PT Next Visit Plan  Assess and modify HEP, manual as needed for ROM, progress periscapular and rotator cuff strengthening as able    PT Home Exercise Plan  Access Code: [email or web address]; Supine flexion with dowel, side lying ER, side lying abduction, row with yellow    Recommended Other Services  Possible  orthopedic referral    Consulted and Agree with Plan of Care  Patient       Patient will benefit from skilled therapeutic intervention in order to improve the following deficits and impairments:  Decreased activity tolerance, Decreased range of motion, Impaired UE functional use, Pain, Postural dysfunction, Decreased strength  Visit Diagnosis: Chronic right shoulder pain  Muscle weakness (generalized)  Stiffness of right shoulder, not elsewhere classified     Problem List Patient Active Problem List   Diagnosis Date Noted  . CAD in native artery 06/10/2019  . Cystic mass of pancreas 06/06/2019  . Pleural effusion, right 05/29/2019  . Chronic right shoulder pain 05/28/2019  . Chronic hyponatremia 05/28/2019  . Post-traumatic osteoarthritis, right shoulder 05/28/2019  . Vitamin D deficiency 01/15/2018  . B12 deficiency anemia 07/15/2016  . Benign schwannoma 07/14/2016  . 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  . Osteoarthritis of hip 01/28/2013  . Essential hypertension, benign 06/28/2012  . Pure hypercholesterolemia 06/28/2012  . BPH (benign prostatic hyperplasia) 06/28/2012  . Routine general medical examination at a health care facility 06/28/2012  . PSA elevation 06/28/2012    Hilda Blades, PT, DPT, LAT, ATC 06/18/19  1:26 PM Phone: [phone] Fax: [facility] [address] [place], [place], [address] Phone: [phone]   Fax:  [phone]  Name: Samuel Fisher MRN: [id number] Date of Birth: [birth year]

## 2019-06-18 NOTE — Patient Instructions (Signed)
Access Code: [email or web address]  URL: [email or web address]  Date: 06/18/2019  Prepared by: Hilda Blades   Exercises Supine Shoulder Flexion Extension AAROM with Dowel - 10 reps - 2 sets - 2x daily - 7x weekly Sidelying Shoulder External Rotation - 10 reps - 2 sets - 2x daily - 7x weekly Sidelying Shoulder Abduction Palm Forward - 10 reps - 2 sets - 1x daily - 7x weekly Standing Row with Anchored Resistance - 10 reps - 2 sets - 2x daily - 7x weekly

## 2019-06-28 ENCOUNTER — Ambulatory Visit: Payer: Medicare Other | Admitting: Physical Therapy

## 2019-07-01 ENCOUNTER — Ambulatory Visit: Payer: Medicare Other | Attending: Internal Medicine | Admitting: Physical Therapy

## 2019-07-01 ENCOUNTER — Encounter: Payer: Self-pay | Admitting: Physical Therapy

## 2019-07-01 ENCOUNTER — Other Ambulatory Visit: Payer: Self-pay

## 2019-07-01 DIAGNOSIS — M6281 Muscle weakness (generalized): Secondary | ICD-10-CM | POA: Diagnosis not present

## 2019-07-01 DIAGNOSIS — M25511 Pain in right shoulder: Secondary | ICD-10-CM | POA: Insufficient documentation

## 2019-07-01 DIAGNOSIS — G8929 Other chronic pain: Secondary | ICD-10-CM

## 2019-07-01 DIAGNOSIS — M25611 Stiffness of right shoulder, not elsewhere classified: Secondary | ICD-10-CM

## 2019-07-01 NOTE — Therapy (Signed)
[place], [place], [address] Phone: [phone]   Fax:  [phone]  Physical Therapy Treatment  Patient Details  Name: Samuel Fisher MRN: [id number] Date of Birth: 1945/08/22 Referring Provider (PT): [name], MD   Encounter Date: 07/01/2019  PT End of Session - 07/01/19 1011    Visit Number  2    Number of Visits  8    Date for PT Re-Evaluation  08/13/19    Authorization Type  [employer] MCR    PT Start Time  1001    PT Stop Time  1043    PT Time Calculation (min)  42 min    Activity Tolerance  Patient tolerated treatment well    Behavior During Therapy  [facility] for tasks assessed/performed       Past Medical History:  Diagnosis Date  . Allergy   . Arthritis   . BPH (benign prostatic hyperplasia) 2010  . Hypertension   . Inner ear dysfunction     Past Surgical History:  Procedure Laterality Date  . JOINT REPLACEMENT    . TONSILLECTOMY  1951  . TOTAL HIP ARTHROPLASTY     right-2002 and left-2004, 2010    There were no vitals filed for this visit.  Subjective Assessment - 07/01/19 1008    Subjective  Patient reports he is doing well. He may have over-done it with his stretching and he had to take a few days off from the exercises.    Currently in Pain?  Yes    Pain Score  1     Pain Location  Shoulder    Pain Orientation  Right    Pain Descriptors / Indicators  Sharp;Aching    Pain Type  Chronic pain    Pain Onset  More than a month ago    Pain Frequency  Intermittent                       [facility] Adult PT Treatment/Exercise - 07/01/19 0001      Exercises   Exercises  Shoulder      Shoulder Exercises: Sidelying   External Rotation  AROM;10 reps    ABduction  AROM;10 reps      Shoulder Exercises: Standing   Extension  20 reps    Theraband Level (Shoulder Extension)  Level 1 (Yellow)    Extension Limitations  VC and TC for technique    Row  20 reps    Theraband Level (Shoulder Row)  Level 3 (Green)    Row Limitations  VC and TC for technique      Shoulder Exercises: Pulleys   Flexion  5 minutes      Shoulder Exercises: ROM/Strengthening   UBE (Upper Arm Bike)  L1 x 4 min (fwd/bwd)    Cybex Press Limitations  10# 2x10 with horiz and vert handles    Cybex Row Limitations  10# 2x10 with horiz and vert handles      Manual Therapy   Manual Therapy  Joint mobilization;Passive ROM    Joint Mobilization  GHJ mobilizations grade I-IV    Passive ROM  Right shoulder all directions             PT Education - 07/01/19 1010    Education Details  HEP    Person(s) Educated  Patient    Methods  Explanation;Demonstration;Verbal cues;Handout    Comprehension  Verbalized understanding;Returned demonstration;Verbal cues required;Need further instruction  PT Short Term Goals - 06/18/19 1156      PT SHORT TERM GOAL #1   Title  Patient will report </= 3-4/10 pain level with reaching overhead or behind back to improve dressing and grooming ability.    Baseline  5-6/10    Time  4    Period  Weeks    Status  New    Target Date  07/16/19      PT SHORT TERM GOAL #2   Title  Patient will exhibit improved ability to reach behind back to L5 in order to improve dressing ability.    Baseline  Sacrum    Time  4    Period  Weeks    Status  New    Target Date  07/16/19      PT SHORT TERM GOAL #3   Title  Patient will be independent with initial HEP to progress in PT.    Baseline  HEP given at eval    Time  4    Period  Weeks    Status  New    Target Date  07/16/19        PT Long Term Goals - 06/18/19 1159      PT LONG TERM GOAL #1   Title  Patient will report improved functional level of </= 31% limitation on FOTO.    Baseline  43% limitation    Time  8    Period  Weeks    Status  New    Target Date  08/13/19      PT LONG TERM GOAL #2   Title  Patient will report improved pain evel of </= 1-2/10 when reaching overhead or  behind back so he can perform household tasks with no limitation.    Time  8    Period  Weeks    Status  New    Target Date  08/13/19      PT LONG TERM GOAL #3   Title  Patient will exhibit functional shoulder range of motion where he is able to perform all household and dressing/grooming tasks with no limitation.    Time  8    Period  Weeks    Status  New    Target Date  08/13/19            Plan - 07/01/19 1011    Clinical Impression Statement  Patient tolerated therapy well. He continues to exhibit limited shoulder motion and strength of the rotator cuff. He requires consistent cueing to ensure proper technique with exercises and he remains in a pain free range of motion. His main impairments continue to be consistent with rotator cuff injury. He would benefit from continued skilled PT to progress his motion and strength to maxiize his functional level.    PT Treatment/Interventions  ADLs/Self Care Home Management;Cryotherapy;Electrical Stimulation;Moist Heat;Therapeutic exercise;Therapeutic activities;Patient/family education;Manual techniques;Dry needling;Passive range of motion;Taping;Spinal Manipulations;Joint Manipulations    PT Next Visit Plan  Assess and modify HEP, manual as needed for ROM, progress periscapular and rotator cuff strengthening as able    PT Home Exercise Plan  Access Code: [email or web address]; Supine flexion with dowel, side lying ER, side lying abduction, row with yellow    Consulted and Agree with Plan of Care  Patient       Patient will benefit from skilled therapeutic intervention in order to improve the following deficits and impairments:  Decreased activity tolerance, Decreased range of motion, Impaired UE functional use, Pain, Postural dysfunction, Decreased strength  Visit Diagnosis: Chronic right shoulder pain  Muscle weakness (generalized)  Stiffness of right shoulder, not elsewhere classified     Problem List Patient Active Problem List   Diagnosis  Date Noted  . CAD in native artery 06/10/2019  . Cystic mass of pancreas 06/06/2019  . Pleural effusion, right 05/29/2019  . Chronic right shoulder pain 05/28/2019  . Chronic hyponatremia 05/28/2019  . Post-traumatic osteoarthritis, right shoulder 05/28/2019  . Vitamin D deficiency 01/15/2018  . B12 deficiency anemia 07/15/2016  . Benign schwannoma 07/14/2016  . 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  . Osteoarthritis of hip 01/28/2013  . Essential hypertension, benign 06/28/2012  . Pure hypercholesterolemia 06/28/2012  . BPH (benign prostatic hyperplasia) 06/28/2012  . Routine general medical examination at a health care facility 06/28/2012  . PSA elevation 06/28/2012    Hilda Blades, PT, DPT, LAT, ATC 07/01/19  12:32 PM Phone: [phone] Fax: [facility] [address] [place], [place], [address] Phone: [phone]   Fax:  [phone]  Name: Samuel Fisher MRN: [id number] Date of Birth: [birth year]

## 2019-07-07 ENCOUNTER — Other Ambulatory Visit: Payer: Self-pay | Admitting: Internal Medicine

## 2019-07-07 DIAGNOSIS — E7800 Pure hypercholesterolemia, unspecified: Secondary | ICD-10-CM

## 2019-07-08 ENCOUNTER — Other Ambulatory Visit: Payer: Self-pay

## 2019-07-08 ENCOUNTER — Telehealth (HOSPITAL_COMMUNITY): Payer: Self-pay

## 2019-07-08 ENCOUNTER — Ambulatory Visit: Payer: Medicare Other | Admitting: Physical Therapy

## 2019-07-08 ENCOUNTER — Encounter: Payer: Self-pay | Admitting: Physical Therapy

## 2019-07-08 DIAGNOSIS — M25611 Stiffness of right shoulder, not elsewhere classified: Secondary | ICD-10-CM

## 2019-07-08 DIAGNOSIS — M6281 Muscle weakness (generalized): Secondary | ICD-10-CM

## 2019-07-08 DIAGNOSIS — G8929 Other chronic pain: Secondary | ICD-10-CM

## 2019-07-08 DIAGNOSIS — M25511 Pain in right shoulder: Secondary | ICD-10-CM | POA: Diagnosis not present

## 2019-07-08 NOTE — Therapy (Signed)
[place], [place], [address] Phone: [phone]   Fax:  [phone]  Physical Therapy Treatment  Patient Details  Name: Samuel Fisher MRN: [id number] Date of Birth: [birth year] Referring Provider (PT): [name], MD   Encounter Date: 07/08/2019  PT End of Session - 07/08/19 1101    Visit Number  3    Number of Visits  8    Date for PT Re-Evaluation  08/13/19    Authorization Type  BCBS MCR    PT Start Time  1050    PT Stop Time  1130    PT Time Calculation (min)  40 min    Activity Tolerance  Patient tolerated treatment well    Behavior During Therapy  [facility] for tasks assessed/performed       Past Medical History:  Diagnosis Date  . Allergy   . Arthritis   . BPH (benign prostatic hyperplasia) 2010  . Hypertension   . Inner ear dysfunction     Past Surgical History:  Procedure Laterality Date  . JOINT REPLACEMENT    . TONSILLECTOMY  1951  . TOTAL HIP ARTHROPLASTY     right-2002 and left-2004, 2010    There were no vitals filed for this visit.  Subjective Assessment - 07/08/19 1058    Subjective  Patient reports he is doing well. His shoulder is feeling good.    Currently in Pain?  Yes    Pain Score  1     Pain Location  Shoulder    Pain Orientation  Right    Pain Descriptors / Indicators  Aching    Pain Type  Chronic pain    Pain Onset  More than a month ago    Pain Frequency  Intermittent                       [facility] Adult PT Treatment/Exercise - 07/08/19 0001      Exercises   Exercises  Shoulder      Shoulder Exercises: Standing   Extension  20 reps   2 sets   Theraband Level (Shoulder Extension)  Level 2 (Red)    Extension Limitations  VC for technique    Other Standing Exercises  Wall ball walk up with black swiss ball 2x8      Shoulder Exercises: Pulleys   Flexion  5 minutes      Shoulder Exercises: ROM/Strengthening   UBE (Upper Arm Bike)  L1  x 4 min (fwd/bwd)    Cybex Press Limitations  20# 2x15 with horiz and vert handles    Cybex Row Limitations  20# 2x15 with horiz and vert handles             PT Education - 07/08/19 1100    Education Details  HEP    Person(s) Educated  Patient    Methods  Explanation;Demonstration;Verbal cues    Comprehension  Verbalized understanding;Returned demonstration;Verbal cues required;Need further instruction       PT Short Term Goals - 06/18/19 1156      PT SHORT TERM GOAL #1   Title  Patient will report </= 3-4/10 pain level with reaching overhead or behind back to improve dressing and grooming ability.    Baseline  5-6/10    Time  4    Period  Weeks    Status  New    Target Date  07/16/19      PT SHORT TERM GOAL #2  Title  Patient will exhibit improved ability to reach behind back to L5 in order to improve dressing ability.    Baseline  Sacrum    Time  4    Period  Weeks    Status  New    Target Date  07/16/19      PT SHORT TERM GOAL #3   Title  Patient will be independent with initial HEP to progress in PT.    Baseline  HEP given at eval    Time  4    Period  Weeks    Status  New    Target Date  07/16/19        PT Long Term Goals - 06/18/19 1159      PT LONG TERM GOAL #1   Title  Patient will report improved functional level of </= 31% limitation on FOTO.    Baseline  43% limitation    Time  8    Period  Weeks    Status  New    Target Date  08/13/19      PT LONG TERM GOAL #2   Title  Patient will report improved pain evel of </= 1-2/10 when reaching overhead or behind back so he can perform household tasks with no limitation.    Time  8    Period  Weeks    Status  New    Target Date  08/13/19      PT LONG TERM GOAL #3   Title  Patient will exhibit functional shoulder range of motion where he is able to perform all household and dressing/grooming tasks with no limitation.    Time  8    Period  Weeks    Status  New    Target Date  08/13/19             Plan - 07/08/19 1240    Clinical Impression Statement  Patient tolerated therapy well and is progressing with his strengthening exercises without increased pain level. He continues to be limited in active overhead motion adn rotator cuff strength consistent with rotator cuff injury. He requires VC for technique with exercises to avoid shrug and maintain pain-free range. He would benefit from continued skilled PT to progress his strength and allow for improved overhead function.    PT Treatment/Interventions  ADLs/Self Care Home Management;Cryotherapy;Electrical Stimulation;Moist Heat;Therapeutic exercise;Therapeutic activities;Patient/family education;Manual techniques;Dry needling;Passive range of motion;Taping;Spinal Manipulations;Joint Manipulations    PT Next Visit Plan  Assess and modify HEP, manual as needed for ROM, progress periscapular and rotator cuff strengthening as able    PT Home Exercise Plan  Access Code: [email or web address]; Supine flexion with dowel, side lying ER, side lying abduction, row with yellow    Consulted and Agree with Plan of Care  Patient       Patient will benefit from skilled therapeutic intervention in order to improve the following deficits and impairments:  Decreased activity tolerance, Decreased range of motion, Impaired UE functional use, Pain, Postural dysfunction, Decreased strength  Visit Diagnosis: Chronic right shoulder pain  Muscle weakness (generalized)  Stiffness of right shoulder, not elsewhere classified     Problem List Patient Active Problem List   Diagnosis Date Noted  . CAD in native artery 06/10/2019  . Cystic mass of pancreas 06/06/2019  . Pleural effusion, right 05/29/2019  . Chronic right shoulder pain 05/28/2019  . Chronic hyponatremia 05/28/2019  . Post-traumatic osteoarthritis, right shoulder 05/28/2019  . Vitamin D deficiency 01/15/2018  . B12 deficiency anemia 07/15/2016  .  Benign schwannoma 07/14/2016  . Facial nerve  palsy 02/29/2016  . Osteoarthritis of hip 01/28/2013  . Essential hypertension, benign 06/28/2012  . Pure hypercholesterolemia 06/28/2012  . BPH (benign prostatic hyperplasia) 06/28/2012  . Routine general medical examination at a health care facility 06/28/2012  . PSA elevation 06/28/2012    Hilda Blades, PT, DPT, LAT, ATC 07/08/19  1:04 PM Phone: [phone] Fax: [facility] [address] [place], [place], [address] Phone: [phone]   Fax:  [phone]  Name: Samuel Fisher MRN: [id number] Date of Birth: Mar 07, 1946

## 2019-07-08 NOTE — Telephone Encounter (Signed)
Spoke with the patient's wife, she stated that he would be here for his test. Asked to call back with any questions. S.Turki Tapanes EMTP

## 2019-07-09 ENCOUNTER — Ambulatory Visit
Admission: RE | Admit: 2019-07-09 | Discharge: 2019-07-09 | Disposition: A | Discharge status: Home / Self Care | Payer: Medicare Other | Source: Ambulatory Visit | Attending: Internal Medicine | Admitting: Internal Medicine

## 2019-07-09 DIAGNOSIS — R1900 Intra-abdominal and pelvic swelling, mass and lump, unspecified site: Secondary | ICD-10-CM

## 2019-07-09 DIAGNOSIS — K862 Cyst of pancreas: Secondary | ICD-10-CM

## 2019-07-09 DIAGNOSIS — K8689 Other specified diseases of pancreas: Secondary | ICD-10-CM | POA: Diagnosis not present

## 2019-07-09 MED ORDER — GADOBENATE DIMEGLUMINE 529 MG/ML IV SOLN
17.0000 mL | Freq: Once | INTRAVENOUS | Status: AC | PRN
  Administered 2019-07-09: 17 mL via INTRAVENOUS

## 2019-07-10 ENCOUNTER — Telehealth: Payer: Self-pay | Admitting: Internal Medicine

## 2019-07-10 ENCOUNTER — Telehealth: Payer: Self-pay

## 2019-07-10 NOTE — Telephone Encounter (Signed)
Called Opal Sidles back and informed that PCP has seen the report and that I will call pt and inform of same.

## 2019-07-10 NOTE — Telephone Encounter (Signed)
F/u  Patient returning call back to CMA on test results.

## 2019-07-10 NOTE — Telephone Encounter (Signed)
    Please contact Opal Sidles at [facility] for MRI report. Call [phone]

## 2019-07-11 ENCOUNTER — Other Ambulatory Visit: Payer: Self-pay

## 2019-07-11 ENCOUNTER — Ambulatory Visit (HOSPITAL_COMMUNITY): Payer: Medicare Other | Attending: Cardiology

## 2019-07-11 DIAGNOSIS — I2510 Atherosclerotic heart disease of native coronary artery without angina pectoris: Secondary | ICD-10-CM | POA: Insufficient documentation

## 2019-07-11 LAB — MYOCARDIAL PERFUSION IMAGING
LV dias vol: 110 mL (ref 62–150)
LV sys vol: 40 mL
Peak HR: 95 {beats}/min
Rest HR: 67 {beats}/min
SDS: 1
SRS: 0
SSS: 1
TID: 0.96

## 2019-07-11 MED ORDER — TECHNETIUM TC 99M TETROFOSMIN IV KIT
10.4000 | PACK | Freq: Once | INTRAVENOUS | Status: AC | PRN
  Administered 2019-07-11: 10.4 via INTRAVENOUS
  Filled 2019-07-11: qty 11

## 2019-07-11 MED ORDER — TECHNETIUM TC 99M TETROFOSMIN IV KIT
30.9000 | PACK | Freq: Once | INTRAVENOUS | Status: AC | PRN
  Administered 2019-07-11: 30.9 via INTRAVENOUS
  Filled 2019-07-11: qty 31

## 2019-07-11 MED ORDER — REGADENOSON 0.4 MG/5ML IV SOLN
0.4000 mg | Freq: Once | INTRAVENOUS | Status: AC
  Administered 2019-07-11: 0.4 mg via INTRAVENOUS

## 2019-07-15 ENCOUNTER — Other Ambulatory Visit: Payer: Self-pay

## 2019-07-15 ENCOUNTER — Encounter: Payer: Self-pay | Admitting: Physical Therapy

## 2019-07-15 ENCOUNTER — Ambulatory Visit: Payer: Medicare Other | Admitting: Physical Therapy

## 2019-07-15 DIAGNOSIS — M6281 Muscle weakness (generalized): Secondary | ICD-10-CM | POA: Diagnosis not present

## 2019-07-15 DIAGNOSIS — G8929 Other chronic pain: Secondary | ICD-10-CM

## 2019-07-15 DIAGNOSIS — M25611 Stiffness of right shoulder, not elsewhere classified: Secondary | ICD-10-CM

## 2019-07-15 DIAGNOSIS — M25511 Pain in right shoulder: Secondary | ICD-10-CM | POA: Diagnosis not present

## 2019-07-15 NOTE — Therapy (Signed)
[place], [place], [address] Phone: [phone]   Fax:  [phone]  Physical Therapy Treatment  Patient Details  Name: Samuel Fisher MRN: [id number] Date of Birth: 1945-08-07 Referring Provider (PT): [name], MD   Encounter Date: 07/15/2019  PT End of Session - 07/15/19 1138    Visit Number  4    Number of Visits  8    Date for PT Re-Evaluation  08/13/19    Authorization Type  BCBS MCR    PT Start Time  [id number]    PT Stop Time  1205    PT Time Calculation (min)  41 min    Activity Tolerance  Patient tolerated treatment well    Behavior During Therapy  [facility] for tasks assessed/performed       Past Medical History:  Diagnosis Date  . Allergy   . Arthritis   . BPH (benign prostatic hyperplasia) 2010  . Hypertension   . Inner ear dysfunction     Past Surgical History:  Procedure Laterality Date  . JOINT REPLACEMENT    . TONSILLECTOMY  1951  . TOTAL HIP ARTHROPLASTY     right-2002 and left-2004, 2010    There were no vitals filed for this visit.  Subjective Assessment - 07/15/19 1124    Subjective  Patient reports he is doing well, his shoulder is doing about the same. He will be out of town the next few weeks so will schedule another visit in a few weeks.    Patient Stated Goals  Improve function of right shoulder so he can reach overhead and behind back.    Currently in Pain?  Yes    Pain Score  1     Pain Location  Shoulder    Pain Orientation  Right    Pain Descriptors / Indicators  Aching    Pain Type  Chronic pain    Pain Onset  More than a month ago    Pain Frequency  Intermittent         [facility] PT Assessment - 07/15/19 0001      AROM   Right Shoulder Flexion  110 Degrees    Right Shoulder Internal Rotation  --   PSIS     Strength   Right Shoulder Flexion  4-/5    Right Shoulder External Rotation  4-/5                   [facility] Adult PT  Treatment/Exercise - 07/15/19 0001      Exercises   Exercises  Shoulder      Shoulder Exercises: Supine   Flexion Limitations  2x10 with dowel assist, straight arm      Shoulder Exercises: Standing   Extension  20 reps   2 sets   Theraband Level (Shoulder Extension)  Level 3 (Green)    Extension Limitations  VC for technique    Other Standing Exercises  Wall ball walk up with black swiss ball 2x5      Shoulder Exercises: ROM/Strengthening   UBE (Upper Arm Bike)  L1 x 4 min (fwd/bwd)    Cybex Press Limitations  20# 2x15 with horiz and vert handles    Cybex Row Limitations  20# 2x15 with horiz and vert handles      Manual Therapy   Manual Therapy  Passive ROM    Passive ROM  Right shoulder all directions  PT Education - 07/15/19 1129    Education Details  HEP    Person(s) Educated  Patient    Methods  Explanation;Demonstration;Verbal cues    Comprehension  Verbalized understanding;Returned demonstration;Verbal cues required;Need further instruction       PT Short Term Goals - 07/15/19 1317      PT SHORT TERM GOAL #1   Title  Patient will report </= 3-4/10 pain level with reaching overhead or behind back to improve dressing and grooming ability.    Baseline  5-6/10    Time  4    Period  Weeks    Status  On-going    Target Date  07/16/19      PT SHORT TERM GOAL #2   Title  Patient will exhibit improved ability to reach behind back to L5 in order to improve dressing ability.    Baseline  Sacrum    Time  4    Period  Weeks    Status  On-going    Target Date  07/16/19      PT SHORT TERM GOAL #3   Title  Patient will be independent with initial HEP to progress in PT.    Baseline  HEP given at eval    Time  4    Period  Weeks    Status  On-going    Target Date  07/16/19        PT Long Term Goals - 06/18/19 1159      PT LONG TERM GOAL #1   Title  Patient will report improved functional level of </= 31% limitation on FOTO.    Baseline  43%  limitation    Time  8    Period  Weeks    Status  New    Target Date  08/13/19      PT LONG TERM GOAL #2   Title  Patient will report improved pain evel of </= 1-2/10 when reaching overhead or behind back so he can perform household tasks with no limitation.    Time  8    Period  Weeks    Status  New    Target Date  08/13/19      PT LONG TERM GOAL #3   Title  Patient will exhibit functional shoulder range of motion where he is able to perform all household and dressing/grooming tasks with no limitation.    Time  8    Period  Weeks    Status  New    Target Date  08/13/19            Plan - 07/15/19 1314    Clinical Impression Statement  Patient tolerated therapy well and is doing well with his strengthening exercises. He continues to exhibit limited active elevation and rotator cuff weakness. He was encouraged to be consistent with exercises while he is gone for a few weeks, his HEP was not changed. He would benefit from continued skilled PT to progress his strength and allow for improved overhead function.    PT Treatment/Interventions  ADLs/Self Care Home Management;Cryotherapy;Electrical Stimulation;Moist Heat;Therapeutic exercise;Therapeutic activities;Patient/family education;Manual techniques;Dry needling;Passive range of motion;Taping;Spinal Manipulations;Joint Manipulations    PT Next Visit Plan  Assess and modify HEP, manual as needed for ROM, progress periscapular and rotator cuff strengthening as able    PT Home Exercise Plan  Access Code: [email or web address]; Supine flexion with dowel, side lying ER, side lying abduction, row with yellow    Consulted and Agree with Plan of Care  Patient  Patient will benefit from skilled therapeutic intervention in order to improve the following deficits and impairments:  Decreased activity tolerance, Decreased range of motion, Impaired UE functional use, Pain, Postural dysfunction, Decreased strength  Visit Diagnosis: Chronic right  shoulder pain  Muscle weakness (generalized)  Stiffness of right shoulder, not elsewhere classified     Problem List Patient Active Problem List   Diagnosis Date Noted  . CAD in native artery 06/10/2019  . Cystic mass of pancreas 06/06/2019  . Pleural effusion, right 05/29/2019  . Chronic right shoulder pain 05/28/2019  . Chronic hyponatremia 05/28/2019  . Post-traumatic osteoarthritis, right shoulder 05/28/2019  . Vitamin D deficiency 01/15/2018  . B12 deficiency anemia 07/15/2016  . Benign schwannoma 07/14/2016  . 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  . Osteoarthritis of hip 01/28/2013  . Essential hypertension, benign 06/28/2012  . Pure hypercholesterolemia 06/28/2012  . BPH (benign prostatic hyperplasia) 06/28/2012  . Routine general medical examination at a health care facility 06/28/2012  . PSA elevation 06/28/2012    Hilda Blades, PT, DPT, LAT, ATC 07/15/19  1:24 PM Phone: [phone] Fax: [facility] [address] [place], [place], [address] Phone: [phone]   Fax:  [phone]  Name: Samuel Fisher MRN: [id number] Date of Birth: 1945-10-25

## 2019-07-25 DIAGNOSIS — G9389 Other specified disorders of brain: Secondary | ICD-10-CM | POA: Diagnosis not present

## 2019-07-25 DIAGNOSIS — Z86018 Personal history of other benign neoplasm: Secondary | ICD-10-CM | POA: Diagnosis not present

## 2019-07-25 DIAGNOSIS — Z9889 Other specified postprocedural states: Secondary | ICD-10-CM | POA: Diagnosis not present

## 2019-07-25 DIAGNOSIS — D333 Benign neoplasm of cranial nerves: Secondary | ICD-10-CM | POA: Diagnosis not present

## 2019-07-26 ENCOUNTER — Other Ambulatory Visit: Payer: Self-pay | Admitting: Internal Medicine

## 2019-07-26 DIAGNOSIS — I10 Essential (primary) hypertension: Secondary | ICD-10-CM

## 2019-08-06 ENCOUNTER — Other Ambulatory Visit: Payer: Self-pay

## 2019-08-06 ENCOUNTER — Encounter: Payer: Self-pay | Admitting: Physical Therapy

## 2019-08-06 ENCOUNTER — Ambulatory Visit: Payer: Medicare Other | Attending: Internal Medicine | Admitting: Physical Therapy

## 2019-08-06 DIAGNOSIS — M6281 Muscle weakness (generalized): Secondary | ICD-10-CM | POA: Diagnosis not present

## 2019-08-06 DIAGNOSIS — M25511 Pain in right shoulder: Secondary | ICD-10-CM | POA: Diagnosis not present

## 2019-08-06 DIAGNOSIS — M25611 Stiffness of right shoulder, not elsewhere classified: Secondary | ICD-10-CM | POA: Diagnosis not present

## 2019-08-06 DIAGNOSIS — G8929 Other chronic pain: Secondary | ICD-10-CM | POA: Diagnosis not present

## 2019-08-06 NOTE — Therapy (Signed)
[place], [place], [address] Phone: [phone]   Fax:  [phone]  Physical Therapy Treatment  Patient Details  Name: Samuel Fisher MRN: [id number] Date of Birth: 1945/12/28 Referring Provider (PT): [name], MD   Encounter Date: 08/06/2019  PT End of Session - 08/06/19 1054    Visit Number  5    Number of Visits  8    Date for PT Re-Evaluation  08/13/19    Authorization Type  BCBS MCR    PT Start Time  1045    PT Stop Time  1125    PT Time Calculation (min)  40 min    Activity Tolerance  Patient tolerated treatment well    Behavior During Therapy  [facility] for tasks assessed/performed       Past Medical History:  Diagnosis Date  . Allergy   . Arthritis   . BPH (benign prostatic hyperplasia) 2010  . Hypertension   . Inner ear dysfunction     Past Surgical History:  Procedure Laterality Date  . JOINT REPLACEMENT    . TONSILLECTOMY  1951  . TOTAL HIP ARTHROPLASTY     right-2002 and left-2004, 2010    There were no vitals filed for this visit.  Subjective Assessment - 08/06/19 1048    Subjective  Patient reports he is doing well. He feels his shoulder is feeling better.    Patient Stated Goals  Improve function of right shoulder so he can reach overhead and behind back.    Currently in Pain?  Yes    Pain Score  1     Pain Location  Shoulder    Pain Orientation  Right    Pain Descriptors / Indicators  Aching    Pain Type  Chronic pain    Pain Onset  More than a month ago    Pain Frequency  Intermittent                       [facility] Adult PT Treatment/Exercise - 08/06/19 0001      Exercises   Exercises  Shoulder      Shoulder Exercises: Supine   Flexion Limitations  2x10 with dowel assist, straight arm      Shoulder Exercises: Standing   Extension  20 reps   2 sets   Theraband Level (Shoulder Extension)  Level 3 (Green)    Row  20 reps   2 sets   Theraband Level (Shoulder Row)  Level 3 (Green)    Other Standing Exercises  Wall ball walk up with black swiss ball 2x5      Shoulder Exercises: ROM/Strengthening   UBE (Upper Arm Bike)  L2 x 5 min (fwd/bwd)    Cybex Press Limitations  25# 2x15 each with horiz and vert handles    Cybex Row Limitations  25# 2x15 each with horiz and vert handles             PT Education - 08/06/19 1052    Education Details  HEP    Person(s) Educated  Patient    Methods  Explanation;Demonstration;Verbal cues;Handout    Comprehension  Verbalized understanding;Returned demonstration;Verbal cues required;Need further instruction       PT Short Term Goals - 07/15/19 1317      PT SHORT TERM GOAL #1   Title  Patient will report </= 3-4/10 pain level with reaching overhead or behind back to improve dressing and grooming ability.  Baseline  5-6/10    Time  4    Period  Weeks    Status  On-going    Target Date  07/16/19      PT SHORT TERM GOAL #2   Title  Patient will exhibit improved ability to reach behind back to L5 in order to improve dressing ability.    Baseline  Sacrum    Time  4    Period  Weeks    Status  On-going    Target Date  07/16/19      PT SHORT TERM GOAL #3   Title  Patient will be independent with initial HEP to progress in PT.    Baseline  HEP given at eval    Time  4    Period  Weeks    Status  On-going    Target Date  07/16/19        PT Long Term Goals - 06/18/19 1159      PT LONG TERM GOAL #1   Title  Patient will report improved functional level of </= 31% limitation on FOTO.    Baseline  43% limitation    Time  8    Period  Weeks    Status  New    Target Date  08/13/19      PT LONG TERM GOAL #2   Title  Patient will report improved pain evel of </= 1-2/10 when reaching overhead or behind back so he can perform household tasks with no limitation.    Time  8    Period  Weeks    Status  New    Target Date  08/13/19      PT LONG TERM GOAL #3   Title   Patient will exhibit functional shoulder range of motion where he is able to perform all household and dressing/grooming tasks with no limitation.    Time  8    Period  Weeks    Status  New    Target Date  08/13/19            Plan - 08/06/19 1054    Clinical Impression Statement  Patient tolerated therapy well and seems to be progressing with strengthening exercises. He does continue to exhibit limited active motion on the right side but his pain has improved since he is more careful with what he does overhead using the right arm. He would benefit from continued skilled PT to progress his strength and allow for improved overhead function.    PT Treatment/Interventions  ADLs/Self Care Home Management;Cryotherapy;Electrical Stimulation;Moist Heat;Therapeutic exercise;Therapeutic activities;Patient/family education;Manual techniques;Dry needling;Passive range of motion;Taping;Spinal Manipulations;Joint Manipulations    PT Next Visit Plan  Reassessment; assess and modify HEP, manual as needed for ROM, progress periscapular and rotator cuff strengthening as able    PT Home Exercise Plan  [id number]: Supine flexion with dowel, side lying ER, side lying abduction, row with blue    Consulted and Agree with Plan of Care  Patient       Patient will benefit from skilled therapeutic intervention in order to improve the following deficits and impairments:  Decreased activity tolerance, Decreased range of motion, Impaired UE functional use, Pain, Postural dysfunction, Decreased strength  Visit Diagnosis: Chronic right shoulder pain  Muscle weakness (generalized)  Stiffness of right shoulder, not elsewhere classified     Problem List Patient Active Problem List   Diagnosis Date Noted  . CAD in native artery 06/10/2019  . Cystic mass of pancreas 06/06/2019  . Pleural  effusion, right 05/29/2019  . Chronic right shoulder pain 05/28/2019  . Chronic hyponatremia 05/28/2019  . Post-traumatic  osteoarthritis, right shoulder 05/28/2019  . Vitamin D deficiency 01/15/2018  . B12 deficiency anemia 07/15/2016  . Benign schwannoma 07/14/2016  . 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  . Osteoarthritis of hip 01/28/2013  . Essential hypertension, benign 06/28/2012  . Pure hypercholesterolemia 06/28/2012  . BPH (benign prostatic hyperplasia) 06/28/2012  . Routine general medical examination at a health care facility 06/28/2012  . PSA elevation 06/28/2012    Hilda Blades, PT, DPT 08/06/19  11:25 AM Phone: [phone] Fax: [facility] [place], [place], [address] Phone: [phone]   Fax:  [phone]  Name: EMREY MANISCALCO MRN: [id number] Date of Birth: 10-04-1945

## 2019-08-13 ENCOUNTER — Encounter: Payer: Self-pay | Admitting: Physical Therapy

## 2019-08-13 ENCOUNTER — Other Ambulatory Visit: Payer: Self-pay

## 2019-08-13 ENCOUNTER — Ambulatory Visit: Payer: Medicare Other | Admitting: Physical Therapy

## 2019-08-13 DIAGNOSIS — M25611 Stiffness of right shoulder, not elsewhere classified: Secondary | ICD-10-CM

## 2019-08-13 DIAGNOSIS — M6281 Muscle weakness (generalized): Secondary | ICD-10-CM

## 2019-08-13 DIAGNOSIS — G8929 Other chronic pain: Secondary | ICD-10-CM | POA: Diagnosis not present

## 2019-08-13 DIAGNOSIS — M25511 Pain in right shoulder: Secondary | ICD-10-CM | POA: Diagnosis not present

## 2019-08-13 NOTE — Therapy (Signed)
[place], [place], [address] Phone: [phone]   Fax:  [phone]  Physical Therapy Treatment  PHYSICAL THERAPY DISCHARGE SUMMARY  Visits from Start of Care: 6  Current functional level related to goals / functional outcomes: Patient continues to have functional deficit with any overhead motion or lifting   Remaining deficits: Strength, AROM   Education / Equipment: HEP, bands Plan: Patient agrees to discharge.  Patient goals were partially met. Patient is being discharged due to being pleased with the current functional level.  ?????     Patient Details  Name: Samuel Fisher MRN: [id number] Date of Birth: [birth year] Referring Provider (PT): [name], MD   Encounter Date: 08/13/2019  PT End of Session - 08/13/19 1050    Visit Number  6    Number of Visits  8    Date for PT Re-Evaluation  08/13/19    Authorization Type  [employer] MCR    PT Start Time  1045    PT Stop Time  1125    PT Time Calculation (min)  40 min    Activity Tolerance  Patient tolerated treatment well    Behavior During Therapy  [facility] for tasks assessed/performed       Past Medical History:  Diagnosis Date  . Allergy   . Arthritis   . BPH (benign prostatic hyperplasia) 2010  . Hypertension   . Inner ear dysfunction     Past Surgical History:  Procedure Laterality Date  . JOINT REPLACEMENT    . TONSILLECTOMY  1951  . TOTAL HIP ARTHROPLASTY     right-2002 and left-2004, 2010    There were no vitals filed for this visit.  Subjective Assessment - 08/13/19 1046    Subjective  Patient reports he is doing well and he feels the exercises do help. He does continue to have occasional pain with daily activities but overall this has improved.    Patient Stated Goals  Improve function of right shoulder so he can reach overhead and behind back.    Currently in Pain?  No/denies    Pain Score  0-No pain    Pain  Location  Shoulder    Pain Orientation  Right    Pain Type  Chronic pain    Pain Onset  More than a month ago    Pain Frequency  Intermittent    Aggravating Factors   Lifting arm overhead, reaching behind back         [facility] PT Assessment - 08/13/19 0001      Assessment   Medical Diagnosis  Chronic right shoulder pain    Referring Provider (PT)  [name], MD    Hand Dominance  Right      Precautions   Precautions  None      Restrictions   Weight Bearing Restrictions  No      Balance Screen   Has the patient fallen in the past 6 months  No      Prior Function   Level of Independence  Independent      Observation/Other Assessments   Focus on Therapeutic Outcomes (FOTO)   41% limitation      Posture/Postural Control   Posture Comments  Patient exhibits rounded shoulder and forward head posture      AROM   Right Shoulder Flexion  110 Degrees    Right Shoulder Internal Rotation  --   PSIS   Right Shoulder External Rotation  --  Occiput     PROM   Right Shoulder Flexion  140 Degrees    Right Shoulder External Rotation  55 Degrees      Strength   Right Shoulder Flexion  4-/5   increased pain   Right Shoulder ABduction  4-/5    Right Shoulder External Rotation  4-/5                   [facility] Adult PT Treatment/Exercise - 08/13/19 0001      Self-Care   Self-Care  Other Self-Care Comments    Other Self-Care Comments   Continues HEP and gym based exercises, further assessment by orthopedic surgeon if needed      Exercises   Exercises  Shoulder      Shoulder Exercises: Supine   Flexion Limitations  2x10 with dowel assist, straight arm      Shoulder Exercises: Sidelying   External Rotation  10 reps   2 sets   ABduction  10 reps   2 sets     Shoulder Exercises: ROM/Strengthening   UBE (Upper Arm Bike)  L2 x 5 min (fwd/bwd)    Cybex Press Limitations  25# 2x15 each with horiz and vert handles    Cybex Row Limitations  25# 2x15 each with  horiz and vert handles             PT Education - 08/13/19 1048    Education Details  HEP    Person(s) Educated  Patient    Methods  Explanation;Demonstration    Comprehension  Verbalized understanding;Returned demonstration       PT Short Term Goals - 08/13/19 1120      PT SHORT TERM GOAL #1   Title  Patient will report </= 3-4/10 pain level with reaching overhead or behind back to improve dressing and grooming ability.    Baseline  Patient reports improved pain    Time  4    Period  Weeks    Status  Achieved    Target Date  07/16/19      PT SHORT TERM GOAL #2   Title  Patient will exhibit improved ability to reach behind back to L5 in order to improve dressing ability.    Baseline  S[place]rum    Time  4    Period  Weeks    Status  Not Met    Target Date  07/16/19      PT SHORT TERM GOAL #3   Title  Patient will be independent with initial HEP to progress in PT.    Baseline  HEP given at eval    Time  4    Period  Weeks    Status  Achieved    Target Date  07/16/19        PT Long Term Goals - 08/13/19 1121      PT LONG TERM GOAL #1   Title  Patient will report improved functional level of </= 31% limitation on FOTO.    Baseline  41% limitation    Time  8    Period  Weeks    Status  Partially Met      PT LONG TERM GOAL #2   Title  Patient will report improved pain evel of </= 1-2/10 when reaching overhead or behind back so he can perform household tasks with no limitation.    Time  8    Period  Weeks    Status  Partially Met  PT LONG TERM GOAL #3   Title  Patient will exhibit functional shoulder range of motion where he is able to perform all household and dressing/grooming tasks with no limitation.    Time  8    Period  Weeks    Status  Not Met            Plan - 08/13/19 1051    Clinical Impression Statement  Patient tolerated therapy well and demonstrated independence with HEP this visit. Patient and therapist agree that he has reached a  point in therapy where his pain has imrpoved and he is progressing well with strengthening exercises so he can transition to independent HEP and gym exercise. His active motion continues to be limited compared to passive motion and he does exhibit weakness of the rotator cuff that is most likely due to a chronic rotator cuff injury. He was encouraged that if he starts to have worsening pain or would like further assessment then patient should schedule appointment with orthopedic surgeon and he was provided with contact information for shoulder specialist. Formal PT is no longer indicated so patient is being discharged at this time.    PT Treatment/Interventions  ADLs/Self Care Home Management;Cryotherapy;Electrical Stimulation;Moist Heat;Therapeutic exercise;Therapeutic activities;Patient/family education;Manual techniques;Dry needling;Passive range of motion;Taping;Spinal Manipulations;Joint Manipulations    PT Next Visit Plan  --    PT Home Exercise Plan  [id number]: Supine flexion with dowel, side lying ER, side lying abduction, row with blue    Consulted and Agree with Plan of Care  Patient       Patient will benefit from skilled therapeutic intervention in order to improve the following deficits and impairments:  Decreased activity tolerance, Decreased range of motion, Impaired UE functional use, Pain, Postural dysfunction, Decreased strength  Visit Diagnosis: Chronic right shoulder pain  Muscle weakness (generalized)  Stiffness of right shoulder, not elsewhere classified     Problem List Patient Active Problem List   Diagnosis Date Noted  . CAD in native artery 06/10/2019  . Cystic mass of pancreas 06/06/2019  . Pleural effusion, right 05/29/2019  . Chronic right shoulder pain 05/28/2019  . Chronic hyponatremia 05/28/2019  . Post-traumatic osteoarthritis, right shoulder 05/28/2019  . Vitamin D deficiency 01/15/2018  . B12 deficiency anemia 07/15/2016  . Benign schwannoma 07/14/2016   . 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  . Osteoarthritis of hip 01/28/2013  . Essential hypertension, benign 06/28/2012  . Pure hypercholesterolemia 06/28/2012  . BPH (benign prostatic hyperplasia) 06/28/2012  . Routine general medical examination at a health care facility 06/28/2012  . PSA elevation 06/28/2012    Hilda Blades, PT, DPT, LAT, ATC 08/13/19  11:32 AM Phone: [phone] Fax: [facility] [address] [place], [place], [address] Phone: [phone]   Fax:  [phone]  Name: Samuel Fisher MRN: [id number] Date of Birth: September 30, 1945

## 2019-10-17 ENCOUNTER — Other Ambulatory Visit: Payer: Self-pay | Admitting: Internal Medicine

## 2019-10-17 DIAGNOSIS — E7800 Pure hypercholesterolemia, unspecified: Secondary | ICD-10-CM

## 2019-10-24 ENCOUNTER — Other Ambulatory Visit: Payer: Self-pay | Admitting: Internal Medicine

## 2019-10-24 DIAGNOSIS — I10 Essential (primary) hypertension: Secondary | ICD-10-CM

## 2019-11-05 ENCOUNTER — Other Ambulatory Visit: Payer: Self-pay

## 2019-11-05 ENCOUNTER — Encounter: Payer: Self-pay | Admitting: Internal Medicine

## 2019-11-05 ENCOUNTER — Ambulatory Visit (INDEPENDENT_AMBULATORY_CARE_PROVIDER_SITE_OTHER): Payer: Medicare Other | Admitting: Internal Medicine

## 2019-11-05 VITALS — BP 162/96 | HR 72 | Temp 98.5°F | Resp 16 | Ht 72.0 in | Wt 193.0 lb

## 2019-11-05 DIAGNOSIS — Z0000 Encounter for general adult medical examination without abnormal findings: Secondary | ICD-10-CM | POA: Diagnosis not present

## 2019-11-05 DIAGNOSIS — R972 Elevated prostate specific antigen [PSA]: Secondary | ICD-10-CM

## 2019-11-05 DIAGNOSIS — I10 Essential (primary) hypertension: Secondary | ICD-10-CM | POA: Diagnosis not present

## 2019-11-05 DIAGNOSIS — E871 Hypo-osmolality and hyponatremia: Secondary | ICD-10-CM

## 2019-11-05 DIAGNOSIS — E7800 Pure hypercholesterolemia, unspecified: Secondary | ICD-10-CM

## 2019-11-05 DIAGNOSIS — E559 Vitamin D deficiency, unspecified: Secondary | ICD-10-CM

## 2019-11-05 DIAGNOSIS — I2510 Atherosclerotic heart disease of native coronary artery without angina pectoris: Secondary | ICD-10-CM

## 2019-11-05 DIAGNOSIS — D519 Vitamin B12 deficiency anemia, unspecified: Secondary | ICD-10-CM | POA: Diagnosis not present

## 2019-11-05 DIAGNOSIS — Z1211 Encounter for screening for malignant neoplasm of colon: Secondary | ICD-10-CM

## 2019-11-05 LAB — CBC WITH DIFFERENTIAL/PLATELET
Basophils Absolute: 0 10*3/uL (ref 0.0–0.1)
Basophils Relative: 0.7 % (ref 0.0–3.0)
Eosinophils Absolute: 0.2 10*3/uL (ref 0.0–0.7)
Eosinophils Relative: 3.3 % (ref 0.0–5.0)
HCT: 34.9 % — ABNORMAL LOW (ref 39.0–52.0)
Hemoglobin: 12.1 g/dL — ABNORMAL LOW (ref 13.0–17.0)
Lymphocytes Relative: 21.2 % (ref 12.0–46.0)
Lymphs Abs: 1.1 10*3/uL (ref 0.7–4.0)
MCHC: 34.8 g/dL (ref 30.0–36.0)
MCV: 96.8 fl (ref 78.0–100.0)
Monocytes Absolute: 0.5 10*3/uL (ref 0.1–1.0)
Monocytes Relative: 10.3 % (ref 3.0–12.0)
Neutro Abs: 3.4 10*3/uL (ref 1.4–7.7)
Neutrophils Relative %: 64.5 % (ref 43.0–77.0)
Platelets: 187 10*3/uL (ref 150.0–400.0)
RBC: 3.6 Mil/uL — ABNORMAL LOW (ref 4.22–5.81)
RDW: 12.9 % (ref 11.5–15.5)
WBC: 5.3 10*3/uL (ref 4.0–10.5)

## 2019-11-05 LAB — LIPID PANEL
Cholesterol: 136 mg/dL (ref 0–200)
HDL: 63.6 mg/dL (ref >39.00)
LDL Cholesterol: 63 mg/dL (ref 0–99)
NonHDL: 72.87
Total CHOL/HDL Ratio: 2
Triglycerides: 47 mg/dL (ref 0.0–149.0)
VLDL: 9.4 mg/dL (ref 0.0–40.0)

## 2019-11-05 LAB — BASIC METABOLIC PANEL
BUN: 11 mg/dL (ref 6–23)
CO2: 27 mEq/L (ref 19–32)
Calcium: 9.3 mg/dL (ref 8.4–10.5)
Chloride: 99 mEq/L (ref 96–112)
Creatinine, Ser: 0.91 mg/dL (ref 0.40–1.50)
GFR: 81.86 mL/min (ref >60.00)
Glucose, Bld: 99 mg/dL (ref 70–99)
Potassium: 4.5 mEq/L (ref 3.5–5.1)
Sodium: 132 mEq/L — ABNORMAL LOW (ref 135–145)

## 2019-11-05 LAB — HEPATIC FUNCTION PANEL
ALT: 18 U/L (ref 0–53)
AST: 22 U/L (ref 0–37)
Albumin: 4.6 g/dL (ref 3.5–5.2)
Alkaline Phosphatase: 50 U/L (ref 39–117)
Bilirubin, Direct: 0.1 mg/dL (ref 0.0–0.3)
Total Bilirubin: 0.5 mg/dL (ref 0.2–1.2)
Total Protein: 6.5 g/dL (ref 6.0–8.3)

## 2019-11-05 MED ORDER — IRBESARTAN 300 MG PO TABS: 300.0000 mg | ORAL_TABLET | Freq: Every day | ORAL | 1 refills | Status: DC

## 2019-11-05 MED ORDER — AMLODIPINE BESYLATE 5 MG PO TABS: 5.0000 mg | ORAL_TABLET | Freq: Every day | ORAL | 1 refills | Status: DC

## 2019-11-05 NOTE — Patient Instructions (Signed)

## 2019-11-05 NOTE — Progress Notes (Signed)
Subjective:  Patient ID: Samuel Fisher, male    DOB: 11/28/1945  Age: 74 y.o. MRN: [id number]  CC: Annual Exam, Hypertension, and Hyperlipidemia  This visit occurred during the SARS-CoV-2 public health emergency.  Safety protocols were in place, including screening questions prior to the visit, additional usage of staff PPE, and extensive cleaning of exam room while observing appropriate contact time as indicated for disinfecting solutions.    HPI Samuel Fisher presents for a CPX.  He complains that the current antihypertensive regimen is causing dizziness.  He tells me that his blood pressure monitor at home gets a blood pressure usually around 140-150/70-80.  He thinks his blood pressure spikes up when he comes in here due to anxiety about seeing a physician.  He is active and denies any recent episodes of chest pain, shortness of breath, palpitations, edema, or fatigue.  He is not currently receiving a B12 supplement.  He is followed by urology every 6 months for an elevated PSA.  He tells me that biopsies have been negative for cancer.  He has no difficulty urinating.  Outpatient Medications Prior to Visit  Medication Sig Dispense Refill  . amoxicillin (AMOXIL) 500 MG capsule Take 2,000 mg by mouth once as needed (for dentist appointment).    Marland Kitchen aspirin 325 MG tablet Take 325 mg by mouth every 4 (four) hours as needed for moderate pain or headache.     . Cholecalciferol 2000 units TABS Take 1 tablet (2,000 Units total) by mouth daily. (Patient not taking: Reported on 06/18/2019) 90 tablet 1  . EPINEPHrine 0.3 mg/0.3 mL IJ SOAJ injection Inject 0.3 mLs (0.3 mg total) into the muscle once. 2 Device 0  . fluticasone (FLONASE) 50 MCG/ACT nasal spray Place 1-2 sprays into both nostrils daily as needed. allergies  2  . rosuvastatin (CRESTOR) 5 MG tablet TAKE 1 TABLET ONCE DAILY. 90 tablet 0  . EDARBYCLOR 40-12.5 MG TABS TAKE 1 TABLET BY MOUTH DAILY. 90 tablet 0   No  facility-administered medications prior to visit.    ROS Review of Systems  Constitutional: Negative for appetite change, diaphoresis, fatigue and unexpected weight change.  HENT: Negative.   Eyes: Negative for visual disturbance.  Respiratory: Negative for cough, chest tightness, shortness of breath and wheezing.   Cardiovascular: Negative for chest pain, palpitations and leg swelling.  Gastrointestinal: Negative for abdominal pain, constipation, diarrhea, nausea and vomiting.  Endocrine: Negative.   Genitourinary: Negative.  Negative for difficulty urinating and hematuria.  Musculoskeletal: Negative for arthralgias and myalgias.  Skin: Negative.  Negative for color change, pallor and rash.  Neurological: Positive for dizziness. Negative for weakness and light-headedness.  Hematological: Negative for adenopathy. Does not bruise/bleed easily.  Psychiatric/Behavioral: Negative.     Objective:  BP (!) 162/96 (BP Location: Left Arm, Patient Position: Sitting, Cuff Size: Large)   Pulse 72   Temp 98.5 F (36.9 C) (Oral)   Resp 16   Ht 6' (1.829 m)   Wt 193 lb (87.5 kg)   SpO2 94%   BMI 26.18 kg/m   BP Readings from Last 3 Encounters:  11/05/19 (!) 162/96  05/28/19 (!) 164/80  12/31/18 (!) 170/102    Wt Readings from Last 3 Encounters:  11/05/19 193 lb (87.5 kg)  07/11/19 185 lb (83.9 kg)  05/28/19 185 lb 5 oz (84.1 kg)    Physical Exam Vitals reviewed.  Constitutional:      Appearance: Normal appearance.  HENT:     Nose: Nose normal.  Mouth/Throat:     Mouth: Mucous membranes are moist.  Eyes:     General: No scleral icterus.    Conjunctiva/sclera: Conjunctivae normal.  Cardiovascular:     Rate and Rhythm: Regular rhythm.     Heart sounds: No murmur.  Pulmonary:     Effort: Pulmonary effort is normal.     Breath sounds: No stridor. No wheezing, rhonchi or rales.  Abdominal:     General: Abdomen is flat.     Palpations: There is no mass.     Tenderness:  There is no abdominal tenderness. There is no guarding.  Musculoskeletal:        General: Normal range of motion.     Cervical back: Neck supple.     Right lower leg: No edema.     Left lower leg: No edema.  Lymphadenopathy:     Cervical: No cervical adenopathy.  Skin:    General: Skin is warm and dry.     Coloration: Skin is not pale.  Neurological:     General: No focal deficit present.     Mental Status: He is alert.  Psychiatric:        Mood and Affect: Mood normal.        Behavior: Behavior normal.     Lab Results  Component Value Date   WBC 5.3 11/05/2019   HGB 12.1 (L) 11/05/2019   HCT 34.9 (L) 11/05/2019   PLT 187.0 11/05/2019   GLUCOSE 99 11/05/2019   CHOL 136 11/05/2019   TRIG 47.0 11/05/2019   HDL 63.60 11/05/2019   LDLCALC 63 11/05/2019   ALT 18 11/05/2019   AST 22 11/05/2019   NA 132 (L) 11/05/2019   K 4.5 11/05/2019   CL 99 11/05/2019   CREATININE 0.91 11/05/2019   BUN 11 11/05/2019   CO2 27 11/05/2019   TSH 1.31 11/05/2019   PSA 7.8 05/28/2019    MR Abdomen W Wo Contrast  Result Date: 07/10/2019 CLINICAL DATA:  The abdominal mass, intra-abdominal neoplasm suspected EXAM: MRI ABDOMEN WITHOUT AND WITH CONTRAST TECHNIQUE: Multiplanar multisequence MR imaging of the abdomen was performed both before and after the administration of intravenous contrast. CONTRAST:  53mL MULTIHANCE GADOBENATE DIMEGLUMINE 529 MG/ML IV SOLN COMPARISON:  Chest CT of 06/05/2019 FINDINGS: Lower chest: Incidental imaging of the lower chest is unremarkable. Hepatobiliary: No focal hepatic lesion, no ductal dilation in the biliary tree. No peri-cholecystic stranding. Pancreas: Pancreatic cystic lesion measuring 2.0 x 1.5 cm (image 18, series 3). Potentially with main duct communication; however, no main duct dilation. There are no areas of internal, suspicious enhancement. Tiny cystic foci elsewhere in the pancreas. For instance, (image 27, series 3) 5 mm cystic area in the head of the  pancreas. (Image 20, series 3) 4 mm lesion in the neck of the pancreas. Spleen:  Spleen is normal. Adrenals/Urinary Tract: Adrenal glands normal size and configuration. Smooth of renal contours with symmetric renal enhancement. No hydronephrosis. Marked urinary bladder distension with trabeculation raising the question of bladder outlet obstruction, long-standing. Stomach/Bowel: Colonic diverticulosis. Otherwise with limited but unremarkable assessment of the gastrointestinal tract. Vascular/Lymphatic: Vascular structures in the abdomen are patent. No signs of adenopathy. Other:  No ascites. Musculoskeletal: Signs of spinal degenerative change. No acute or destructive bone process. IMPRESSION: 1. 2.0 x 1.5 cm cystic lesion in the head of the pancreas. Potentially with main duct communication, however, no main duct dilation. This is most likely a side branch IPMN. Follow-up MRI in 6 months is suggested. 2.  Marked urinary bladder distension with trabeculation raising the question of bladder outlet obstruction, long-standing. Correlation with any symptoms is suggested. Given degree of bladder distension urologic consultation may be warranted. 3. Tiny cystic foci elsewhere in the pancreas, largest measuring 5 mm. Attention on follow-up. 4. Insert PRA call report Electronically Signed   By: Zetta Bills M.D.   On: 07/10/2019 08:28    Assessment & Plan:   Etheridge was seen today for annual exam, hypertension and hyperlipidemia.  Diagnoses and all orders for this visit:  Essential hypertension, benign- His blood pressure is not adequately well controlled and he complains of dizziness and has a low sodium level.  I recommended that he stop taking the thiazide diuretic.  Will try to control his blood pressure with an ARB and calcium channel blocker. -     irbesartan (AVAPRO) 300 MG tablet; Take 1 tablet (300 mg total) by mouth daily. -     amLODipine (NORVASC) 5 MG tablet; Take 1 tablet (5 mg total) by mouth  daily. -     CBC with Differential/Platelet; Future -     TSH; Future -     TSH -     CBC with Differential/Platelet  CAD in native artery- He denies any recent episodes of angina.  Will continue to work on risk factor modifications. -     irbesartan (AVAPRO) 300 MG tablet; Take 1 tablet (300 mg total) by mouth daily. -     amLODipine (NORVASC) 5 MG tablet; Take 1 tablet (5 mg total) by mouth daily. -     Lipid panel; Future -     Lipid panel  Anemia due to vitamin B12 deficiency, unspecified B12 deficiency type- He is anemic and has a low B12 level.  I have asked him to start parenteral B12 replacement therapy. -     CBC with Differential/Platelet; Future -     Vitamin B12; Future -     Folate; Future -     Folate -     Vitamin B12 -     CBC with Differential/Platelet  Routine general medical examination at a health care facility- Exam completed, labs reviewed, vaccines reviewed and updated, I have asked him to undergo a Cologuard to screen for colon cancer, patient education material was given.  Vitamin D deficiency  Pure hypercholesterolemia- He has achieved his LDL goal and is doing well on the statin. -     Lipid panel; Future -     Hepatic function panel; Future -     Hepatic function panel -     Lipid panel  PSA elevation -     Cancel: PSA, total and free; Future  Chronic hyponatremia- His sodium level is stable at 132.  Will discontinue the thiazide diuretic.  This is likely SIADH related to his history of schwannoma. -     Basic metabolic panel; Future -     Basic metabolic panel  Colon cancer screening -     Cologuard   I have discontinued [employer]. I am also having him start on irbesartan and amLODipine. Additionally, I am having him maintain his aspirin, EPINEPHrine, amoxicillin, fluticasone, Cholecalciferol, and rosuvastatin.  Meds ordered this encounter  Medications  . irbesartan (AVAPRO) 300 MG tablet    Sig: Take 1 tablet (300 mg  total) by mouth daily.    Dispense:  90 tablet    Refill:  1  . amLODipine (NORVASC) 5 MG tablet    Sig: Take 1  tablet (5 mg total) by mouth daily.    Dispense:  90 tablet    Refill:  1   In addition to time spent on CPE, I spent 50 minutes in preparing to see the patient by review of recent labs, imaging and procedures, obtaining and reviewing separately obtained history, communicating with the patient and family or caregiver, ordering medications, tests or procedures, and documenting clinical information in the EHR including the differential Dx, treatment, and any further evaluation and other management of 1. Essential hypertension, benign 2. CAD in native artery 3. Anemia due to vitamin B12 deficiency, unspecified B12 deficiency type 4. Vitamin D deficiency 5. Pure hypercholesterolemia 6. PSA elevation 7. Chronic hyponatremia    Follow-up: Return in about 3 months (around 02/05/2020).  Scarlette Calico, MD

## 2019-11-06 ENCOUNTER — Encounter: Payer: Self-pay | Admitting: Internal Medicine

## 2019-11-06 LAB — TSH: TSH: 1.31 u[IU]/mL (ref 0.35–4.50)

## 2019-11-06 LAB — FOLATE: Folate: 12.2 ng/mL (ref >5.9)

## 2019-11-06 LAB — VITAMIN B12: Vitamin B-12: 176 pg/mL — ABNORMAL LOW (ref 211–911)

## 2019-11-07 ENCOUNTER — Ambulatory Visit: Payer: Medicare Other

## 2019-11-08 ENCOUNTER — Ambulatory Visit (INDEPENDENT_AMBULATORY_CARE_PROVIDER_SITE_OTHER): Payer: Medicare Other | Admitting: *Deleted

## 2019-11-08 ENCOUNTER — Other Ambulatory Visit: Payer: Self-pay

## 2019-11-08 DIAGNOSIS — E538 Deficiency of other specified B group vitamins: Secondary | ICD-10-CM | POA: Diagnosis not present

## 2019-11-08 MED ORDER — CYANOCOBALAMIN 1000 MCG/ML IJ SOLN
1000.0000 ug | Freq: Once | INTRAMUSCULAR | Status: AC
  Administered 2019-11-08: 1000 ug via INTRAMUSCULAR

## 2019-11-08 NOTE — Progress Notes (Addendum)
I have reviewed and agree.

## 2019-11-25 DIAGNOSIS — Z1212 Encounter for screening for malignant neoplasm of rectum: Secondary | ICD-10-CM | POA: Diagnosis not present

## 2019-11-25 DIAGNOSIS — Z1211 Encounter for screening for malignant neoplasm of colon: Secondary | ICD-10-CM | POA: Diagnosis not present

## 2019-11-25 LAB — COLOGUARD: Cologuard: NEGATIVE

## 2019-11-26 ENCOUNTER — Telehealth: Payer: Self-pay

## 2019-11-26 NOTE — Telephone Encounter (Signed)
Key: [id number]

## 2019-11-28 LAB — COLOGUARD: COLOGUARD: NEGATIVE

## 2019-11-29 ENCOUNTER — Other Ambulatory Visit: Payer: Self-pay | Admitting: Internal Medicine

## 2019-12-02 ENCOUNTER — Other Ambulatory Visit: Payer: Self-pay | Admitting: Internal Medicine

## 2019-12-02 DIAGNOSIS — E7800 Pure hypercholesterolemia, unspecified: Secondary | ICD-10-CM

## 2019-12-04 ENCOUNTER — Encounter: Payer: Self-pay | Admitting: Internal Medicine

## 2019-12-16 DIAGNOSIS — Z0120 Encounter for dental examination and cleaning without abnormal findings: Secondary | ICD-10-CM | POA: Diagnosis not present

## 2019-12-18 ENCOUNTER — Ambulatory Visit (INDEPENDENT_AMBULATORY_CARE_PROVIDER_SITE_OTHER): Payer: Medicare Other | Admitting: *Deleted

## 2019-12-18 ENCOUNTER — Other Ambulatory Visit: Payer: Self-pay

## 2019-12-18 DIAGNOSIS — E538 Deficiency of other specified B group vitamins: Secondary | ICD-10-CM

## 2019-12-18 MED ORDER — CYANOCOBALAMIN 1000 MCG/ML IJ SOLN
1000.0000 ug | Freq: Once | INTRAMUSCULAR | Status: AC
  Administered 2019-12-18: 1000 ug via INTRAMUSCULAR

## 2019-12-18 NOTE — Progress Notes (Addendum)
I have reviewed and agree  pls cosign for B12 inj.Marland KitchenJohny Chess

## 2019-12-26 ENCOUNTER — Ambulatory Visit (INDEPENDENT_AMBULATORY_CARE_PROVIDER_SITE_OTHER): Payer: Medicare Other

## 2019-12-26 ENCOUNTER — Other Ambulatory Visit: Payer: Self-pay

## 2019-12-26 DIAGNOSIS — E538 Deficiency of other specified B group vitamins: Secondary | ICD-10-CM | POA: Diagnosis not present

## 2019-12-26 MED ORDER — CYANOCOBALAMIN 1000 MCG/ML IJ SOLN
1000.0000 ug | INTRAMUSCULAR | Status: AC
  Administered 2019-12-26 – 2020-04-16 (×2): 1000 ug via INTRAMUSCULAR

## 2019-12-26 NOTE — Progress Notes (Signed)
Pt here for monthly B12 injection per Dr Ronnald Ramp.   B12 1012mcg given IM left deltoid and pt tolerated injection well.  Pt to schedule next visit upon check out.

## 2019-12-30 ENCOUNTER — Other Ambulatory Visit: Payer: Self-pay | Admitting: Internal Medicine

## 2019-12-30 DIAGNOSIS — K862 Cyst of pancreas: Secondary | ICD-10-CM

## 2020-01-01 ENCOUNTER — Ambulatory Visit: Payer: Medicare Other

## 2020-01-02 DIAGNOSIS — N400 Enlarged prostate without lower urinary tract symptoms: Secondary | ICD-10-CM | POA: Diagnosis not present

## 2020-01-02 LAB — PSA: PSA: 6.99

## 2020-01-07 DIAGNOSIS — N400 Enlarged prostate without lower urinary tract symptoms: Secondary | ICD-10-CM | POA: Diagnosis not present

## 2020-01-07 DIAGNOSIS — R972 Elevated prostate specific antigen [PSA]: Secondary | ICD-10-CM | POA: Diagnosis not present

## 2020-01-29 ENCOUNTER — Ambulatory Visit
Admission: RE | Admit: 2020-01-29 | Discharge: 2020-01-29 | Disposition: A | Discharge status: Home / Self Care | Payer: Medicare Other | Source: Ambulatory Visit | Attending: Internal Medicine | Admitting: Internal Medicine

## 2020-01-29 ENCOUNTER — Other Ambulatory Visit: Payer: Self-pay

## 2020-01-29 DIAGNOSIS — N3289 Other specified disorders of bladder: Secondary | ICD-10-CM | POA: Diagnosis not present

## 2020-01-29 DIAGNOSIS — K862 Cyst of pancreas: Secondary | ICD-10-CM | POA: Diagnosis not present

## 2020-01-29 DIAGNOSIS — K8689 Other specified diseases of pancreas: Secondary | ICD-10-CM | POA: Diagnosis not present

## 2020-01-29 MED ORDER — GADOBENATE DIMEGLUMINE 529 MG/ML IV SOLN
20.0000 mL | Freq: Once | INTRAVENOUS | Status: AC | PRN
  Administered 2020-01-29: 20 mL via INTRAVENOUS

## 2020-01-30 ENCOUNTER — Other Ambulatory Visit: Payer: Self-pay | Admitting: Internal Medicine

## 2020-01-30 DIAGNOSIS — K862 Cyst of pancreas: Secondary | ICD-10-CM

## 2020-02-13 DIAGNOSIS — H5213 Myopia, bilateral: Secondary | ICD-10-CM | POA: Diagnosis not present

## 2020-02-19 ENCOUNTER — Ambulatory Visit (INDEPENDENT_AMBULATORY_CARE_PROVIDER_SITE_OTHER): Payer: Medicare Other

## 2020-02-19 ENCOUNTER — Other Ambulatory Visit: Payer: Self-pay

## 2020-02-19 DIAGNOSIS — E538 Deficiency of other specified B group vitamins: Secondary | ICD-10-CM

## 2020-02-19 MED ORDER — CYANOCOBALAMIN 1000 MCG/ML IJ SOLN
1000.0000 ug | Freq: Once | INTRAMUSCULAR | Status: AC
  Administered 2020-02-19: 1000 ug via INTRAMUSCULAR

## 2020-02-19 NOTE — Progress Notes (Signed)
Pt here for monthly B12 injection per Dr. Ronnald Ramp  B12 1062mcg given IM, and pt tolerated injection well.  Pt will call office and schedule next monthly B12 injection.

## 2020-02-27 ENCOUNTER — Telehealth: Payer: Self-pay

## 2020-02-27 NOTE — Telephone Encounter (Signed)
LVM for pt to rtn my call to schedule AWV with [facility].  

## 2020-03-17 ENCOUNTER — Ambulatory Visit (INDEPENDENT_AMBULATORY_CARE_PROVIDER_SITE_OTHER): Payer: Medicare Other | Admitting: Internal Medicine

## 2020-03-17 ENCOUNTER — Encounter: Payer: Self-pay | Admitting: Internal Medicine

## 2020-03-17 ENCOUNTER — Other Ambulatory Visit: Payer: Self-pay

## 2020-03-17 ENCOUNTER — Ambulatory Visit (INDEPENDENT_AMBULATORY_CARE_PROVIDER_SITE_OTHER): Payer: Medicare Other

## 2020-03-17 VITALS — BP 120/88 | HR 89 | Temp 98.1°F | Resp 16 | Ht 72.0 in | Wt 189.2 lb

## 2020-03-17 VITALS — BP 138/86 | HR 89 | Temp 98.1°F | Resp 16 | Ht 72.0 in | Wt 189.0 lb

## 2020-03-17 DIAGNOSIS — I10 Essential (primary) hypertension: Secondary | ICD-10-CM

## 2020-03-17 DIAGNOSIS — T50905A Adverse effect of unspecified drugs, medicaments and biological substances, initial encounter: Secondary | ICD-10-CM

## 2020-03-17 DIAGNOSIS — E871 Hypo-osmolality and hyponatremia: Secondary | ICD-10-CM

## 2020-03-17 DIAGNOSIS — Z0000 Encounter for general adult medical examination without abnormal findings: Secondary | ICD-10-CM | POA: Diagnosis not present

## 2020-03-17 DIAGNOSIS — D519 Vitamin B12 deficiency anemia, unspecified: Secondary | ICD-10-CM | POA: Diagnosis not present

## 2020-03-17 DIAGNOSIS — R972 Elevated prostate specific antigen [PSA]: Secondary | ICD-10-CM

## 2020-03-17 NOTE — Progress Notes (Signed)
Subjective:  Patient ID: Samuel Fisher, male    DOB: November 25, 1945  Age: 74 y.o. MRN: [id number]  CC: Hypertension  This visit occurred during the SARS-CoV-2 public health emergency.  Safety protocols were in place, including screening questions prior to the visit, additional usage of staff PPE, and extensive cleaning of exam room while observing appropriate contact time as indicated for disinfecting solutions.    HPI Samuel Fisher presents for f/up - He feels well and offers no complaints.  He tells me his blood pressure is well controlled on the Edarbyclor.  He denies headache, blurred vision, dizziness, or lightheadedness.  Outpatient Medications Prior to Visit  Medication Sig Dispense Refill  . amoxicillin (AMOXIL) 500 MG capsule Take 2,000 mg by mouth once as needed (for dentist appointment).    Marland Kitchen aspirin 325 MG tablet Take 325 mg by mouth every 4 (four) hours as needed for moderate pain or headache.     . Cholecalciferol 2000 units TABS Take 1 tablet (2,000 Units total) by mouth daily. 90 tablet 1  . EDARBYCLOR 40-12.5 MG TABS TAKE 1 TABLET BY MOUTH DAILY. 90 tablet 1  . fluticasone (FLONASE) 50 MCG/ACT nasal spray Place 1-2 sprays into both nostrils daily as needed. allergies  2  . rosuvastatin (CRESTOR) 5 MG tablet TAKE 1 TABLET ONCE DAILY. 90 tablet 0  . amLODipine (NORVASC) 5 MG tablet Take 1 tablet (5 mg total) by mouth daily. 90 tablet 1  . irbesartan (AVAPRO) 300 MG tablet Take 1 tablet (300 mg total) by mouth daily. 90 tablet 1  . EPINEPHrine 0.3 mg/0.3 mL IJ SOAJ injection Inject 0.3 mLs (0.3 mg total) into the muscle once. 2 Device 0   Facility-Administered Medications Prior to Visit  Medication Dose Route Frequency Provider Last Rate Last Admin  . cyanocobalamin ((VITAMIN B-12)) injection 1,000 mcg  1,000 mcg Intramuscular Q30 days [name], MD   1,000 mcg at 12/26/19 1513    ROS Review of Systems  Constitutional: Negative.  Negative for diaphoresis,  fatigue and unexpected weight change.  HENT: Negative.   Eyes: Negative for visual disturbance.  Respiratory: Negative for cough, chest tightness, shortness of breath and wheezing.   Cardiovascular: Negative for chest pain, palpitations and leg swelling.  Gastrointestinal: Negative for abdominal pain, diarrhea and vomiting.  Endocrine: Negative.   Genitourinary: Negative.  Negative for difficulty urinating.  Musculoskeletal: Negative.   Skin: Negative.  Negative for color change.  Neurological: Negative.  Negative for dizziness, weakness, light-headedness and numbness.  Hematological: Negative for adenopathy. Does not bruise/bleed easily.  Psychiatric/Behavioral: Negative.     Objective:  BP 138/86   Pulse 89   Temp 98.1 F (36.7 C) (Oral)   Resp 16   Ht 6' (1.829 m)   Wt 189 lb (85.7 kg)   SpO2 97%   BMI 25.63 kg/m   BP Readings from Last 3 Encounters:  03/17/20 138/86  03/17/20 120/88  11/05/19 (!) 162/96    Wt Readings from Last 3 Encounters:  03/17/20 189 lb (85.7 kg)  03/17/20 189 lb 3.2 oz (85.8 kg)  11/05/19 193 lb (87.5 kg)    Physical Exam Vitals reviewed.  Constitutional:      Appearance: Normal appearance.  HENT:     Nose: Nose normal.     Mouth/Throat:     Mouth: Mucous membranes are moist.  Eyes:     Conjunctiva/sclera: Conjunctivae normal.  Cardiovascular:     Rate and Rhythm: Normal rate and regular rhythm.  Heart sounds: No murmur heard.   Pulmonary:     Effort: Pulmonary effort is normal.     Breath sounds: No stridor. No wheezing, rhonchi or rales.  Abdominal:     General: Abdomen is flat.     Palpations: There is no mass.     Tenderness: There is no abdominal tenderness.  Musculoskeletal:        General: Normal range of motion.     Cervical back: Neck supple.     Right lower leg: No edema.     Left lower leg: No edema.  Lymphadenopathy:     Cervical: No cervical adenopathy.  Skin:    General: Skin is warm and dry.      Coloration: Skin is not pale.  Neurological:     General: No focal deficit present.     Mental Status: He is alert.  Psychiatric:        Mood and Affect: Mood normal.     Lab Results  Component Value Date   WBC 6.4 03/17/2020   HGB 13.4 03/17/2020   HCT 38.8 03/17/2020   PLT 218 03/17/2020   GLUCOSE 107 (H) 03/17/2020   CHOL 136 11/05/2019   TRIG 47.0 11/05/2019   HDL 63.60 11/05/2019   LDLCALC 63 11/05/2019   ALT 18 11/05/2019   AST 22 11/05/2019   NA 133 (L) 03/17/2020   K 4.5 03/17/2020   CL 97 (L) 03/17/2020   CREATININE 1.04 03/17/2020   BUN 12 03/17/2020   CO2 28 03/17/2020   TSH 1.31 11/05/2019   PSA 6.99 01/02/2020    MR Abdomen W Wo Contrast  Result Date: 01/30/2020 CLINICAL DATA:  Follow-up pancreatic head lesion EXAM: MRI ABDOMEN WITHOUT AND WITH CONTRAST TECHNIQUE: Multiplanar multisequence MR imaging of the abdomen was performed both before and after the administration of intravenous contrast. CONTRAST:  57mL MULTIHANCE GADOBENATE DIMEGLUMINE 529 MG/ML IV SOLN COMPARISON:  None. FINDINGS: Lower chest: No acute findings. Hepatobiliary: No mass or other parenchymal abnormality identified. Pancreas: There is a redemonstrated multilobulated fluid signal lesion of the pancreatic tail measuring 2.0 x 1.5 cm, unchanged compared to prior examination (series 7, image 20). Redemonstrated multiloculated cystic lesion or adjacent lesions in the pancreatic head measuring approximately 1.7 x 1.3 cm, which are enlarged compared to prior examination, at that time measuring approximately 1.3 x 0.8 cm (series 7, image 26, series 12, image 30). No solid contrast enhancing component, inflammatory changes, or other parenchymal abnormality identified. No definitive communication of either these lesions to the main pancreatic duct. There are numerous additional tiny fluid signal lesions throughout the pancreas measuring no greater than 3-4 mm, for example in the pancreatic neck (series 3,  image 20). No pancreatic ductal dilatation. Spleen:  Within normal limits in size and appearance. Adrenals/Urinary Tract: No masses identified. No evidence of hydronephrosis. Stomach/Bowel: Visualized portions within the abdomen are unremarkable. Vascular/Lymphatic: No pathologically enlarged lymph nodes identified. No abdominal aortic aneurysm demonstrated. Other:  Distended urinary bladder. Musculoskeletal: No suspicious bone lesions identified. IMPRESSION: 1. Redemonstrated multilobulated cystic lesion of the pancreatic tail measuring 2.0 x 1.5 cm, unchanged compared to prior examination. 2. Redemonstrated multiloculated cystic lesion or adjacent lesions in the pancreatic head measuring approximately 1.7 x 1.3 cm in total, which are enlarged compared to prior examination, at that time measuring approximately 1.3 x 0.8 cm. Differential considerations include pancreatic pseudocyst versus cystic neoplasm. Given significant interval enlargement recommend EUS and FNA. This recommendation follows ACR consensus guidelines: Management of Incidental Pancreatic Cysts:  A White Paper of the ACR Incidental Findings Committee. [place] [id number]. 3. Distended urinary bladder; similar in appearance to prior examination, correlate for urinary retention. Electronically Signed   By: Eddie Candle M.D.   On: 01/30/2020 08:59    Assessment & Plan:   Avonte was seen today for hypertension.  Diagnoses and all orders for this visit:  Anemia due to vitamin B12 deficiency, unspecified B12 deficiency type- His H&H and folate levels are normal.  Will continue parenteral B12 replacement therapy. -     CBC with Differential/Platelet; Future -     Folate; Future -     Folate -     CBC with Differential/Platelet  Essential hypertension, benign- His blood pressure is adequately well controlled.  His electrolytes are stable and he is asymptomatic.  Will continue the combination of Edarbi and chlorthalidone. -      BASIC METABOLIC PANEL WITH GFR; Future -     BASIC METABOLIC PANEL WITH GFR  PSA elevation  Chronic hyponatremia- See above. -     BASIC METABOLIC PANEL WITH GFR; Future -     BASIC METABOLIC PANEL WITH GFR  Hypercalcemia due to a drug- This is very likely related to the chlorthalidone.  He is asymptomatic with respect to this.  I have asked him to come back in 1 to 2 months for me to recheck his calcium level.  If it continues to be elevated then we will consider discontinuing the chlorthalidone.   I have discontinued Cornie B. Salemi's irbesartan and amLODipine. I am also having him maintain his aspirin, amoxicillin, fluticasone, Cholecalciferol, Edarbyclor, and rosuvastatin. We will continue to administer cyanocobalamin.  No orders of the defined types were placed in this encounter.    Follow-up: No follow-ups on file.  Scarlette Calico, MD

## 2020-03-17 NOTE — Progress Notes (Signed)
Subjective:   Samuel Fisher is a 74 y.o. male who presents for Medicare Annual/Subsequent preventive examination.  Review of Systems    No ROS. Medicare Wellness Visit Cardiac Risk Factors include: advanced age (>26men, >19 women);dyslipidemia;family history of premature cardiovascular disease;hypertension;male gender;smoking/ tobacco exposure;Other (see comment), Risk factor comments: occassional cigar and alcohol 3x week     Objective:    Today's Vitals   03/17/20 1442  BP: 120/88  Pulse: 89  Resp: 16  Temp: 98.1 F (36.7 C)  SpO2: 97%  Weight: 189 lb 3.2 oz (85.8 kg)  Height: 6' (1.829 m)  PainSc: 0-No pain   Body mass index is 25.66 kg/m.  Advanced Directives 03/17/2020 06/18/2019 07/17/2016 12/30/2015 04/07/2015  Does Patient Have a Medical Advance Directive? Yes Yes Yes No Yes  Type of Advance Directive Living will;Healthcare Power of [place];Living will [place];Living will - [place];Living will  Does patient want to make changes to medical advance directive? No - Patient declined - - - No - Patient declined  Copy of [place] in Chart? No - copy requested - Yes - Yes    Current Medications (verified) Outpatient Encounter Medications as of 03/17/2020  Medication Sig  . amLODipine (NORVASC) 5 MG tablet Take 1 tablet (5 mg total) by mouth daily.  Marland Kitchen amoxicillin (AMOXIL) 500 MG capsule Take 2,000 mg by mouth once as needed (for dentist appointment).  Marland Kitchen aspirin 325 MG tablet Take 325 mg by mouth every 4 (four) hours as needed for moderate pain or headache.   . Cholecalciferol 2000 units TABS Take 1 tablet (2,000 Units total) by mouth daily.  Marland Kitchen EDARBYCLOR 40-12.5 MG TABS TAKE 1 TABLET BY MOUTH DAILY.  . fluticasone (FLONASE) 50 MCG/ACT nasal spray Place 1-2 sprays into both nostrils daily as needed. allergies  . irbesartan (AVAPRO) 300 MG tablet Take 1 tablet (300 mg total) by  mouth daily.  . rosuvastatin (CRESTOR) 5 MG tablet TAKE 1 TABLET ONCE DAILY.  . [DISCONTINUED] Azilsartan-Chlorthalidone (EDARBYCLOR) 40-12.5 MG TABS Take 1 tablet by mouth daily.  . [DISCONTINUED] EPINEPHrine 0.3 mg/0.3 mL IJ SOAJ injection Inject 0.3 mLs (0.3 mg total) into the muscle once.   Facility-Administered Encounter Medications as of 03/17/2020  Medication  . cyanocobalamin ((VITAMIN B-12)) injection 1,000 mcg    Allergies (verified) Bee venom   History: Past Medical History:  Diagnosis Date  . Allergy   . Arthritis   . BPH (benign prostatic hyperplasia) 2010  . Hypertension   . Inner ear dysfunction    Past Surgical History:  Procedure Laterality Date  . JOINT REPLACEMENT    . TONSILLECTOMY  1951  . TOTAL HIP ARTHROPLASTY     right-2002 and left-2004, 2010   Family History  Problem Relation Age of Onset  . Arthritis Other   . Heart disease Other   . Hypertension Other   . Stroke Other   . Early death Other   . Mental illness Other   . Diabetes Neg Hx   . Hyperlipidemia Neg Hx   . Cancer Mother   . Cancer Father   . Hypertension Father    Social History   Socioeconomic History  . Marital status: Married    Spouse name: Not on file  . Number of children: Not on file  . Years of education: Not on file  . Highest education level: Not on file  Occupational History  . Not on file  Tobacco  Use  . Smoking status: Former Smoker    Quit date: 06/28/1976    Years since quitting: 43.7  . Smokeless tobacco: Never Used  Substance and Sexual Activity  . Alcohol use: Yes    Alcohol/week: 3.0 standard drinks    Types: 3 Cans of beer per week  . Drug use: No  . Sexual activity: Yes  Other Topics Concern  . Not on file  Social History Narrative  . Not on file   Social Determinants of Health   Financial Resource Strain: Low Risk   . Difficulty of Paying Living Expenses: Not hard at all  Food Insecurity: No Food Insecurity  . Worried About Sales executive in the Last Year: Never true  . Ran Out of Food in the Last Year: Never true  Transportation Needs: No Transportation Needs  . Lack of Transportation (Medical): No  . Lack of Transportation (Non-Medical): No  Physical Activity: Sufficiently Active  . Days of Exercise per Week: 5 days  . Minutes of Exercise per Session: 60 min  Stress: No Stress Concern Present  . Feeling of Stress : Not at all  Social Connections: Socially Integrated  . Frequency of Communication with Friends and Family: More than three times a week  . Frequency of Social Gatherings with Friends and Family: More than three times a week  . Attends Religious Services: More than 4 times per year  . Active Member of Clubs or Organizations: Yes  . Attends Archivist Meetings: More than 4 times per year  . Marital Status: Married    Tobacco Counseling Counseling given: Not Answered   Clinical Intake:  Pre-visit preparation completed: Yes  Pain : No/denies pain Pain Score: 0-No pain     BMI - recorded: 25.66 Nutritional Status: BMI 25 -29 Overweight Nutritional Risks: None Diabetes: No  How often do you need to have someone help you when you read instructions, pamphlets, or other written materials from your doctor or pharmacy?: 1 - Never What is the last grade level you completed in school?: Bachelor's Degree from [employer]  Diabetic? no  Interpreter Needed?: No  Information entered by :: Riko Lumsden N. Breelynn Bankert, LPN   Activities of Daily Living In your present state of health, do you have any difficulty performing the following activities: 03/17/2020  Hearing? 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 N  Difficulty concentrating or making decisions? N  Walking or climbing stairs? N  Dressing or bathing? N  Doing errands, shopping? N  Preparing Food and eating ? N  Using the Toilet? N  In the past six months, have you accidently leaked urine? N  Do you have problems with loss of bowel control? N   Managing your Medications? N  Managing your Finances? N  Housekeeping or managing your Housekeeping? N  Some recent data might be hidden    Patient Care Team: [name], MD as PCP - General (Internal Medicine)  Indicate any recent Medical Services you may have received from other than [facility] providers in the past year (date may be approximate).     Assessment:   This is a routine wellness examination for Srijan.  Hearing/Vision screen No exam data present  Dietary issues and exercise activities discussed: Current Exercise Habits: Structured exercise class, Type of exercise: strength training/weights;stretching;treadmill;walking;Other - see comments (squats with 65 lbs weights; wt training at gym 3 x week for 45-60 minutes, squats), Time (Minutes): 60, Frequency (Times/Week): 5, Weekly Exercise (Minutes/Week): 300,  Intensity: Moderate, Exercise limited by: orthopedic condition(s);respiratory conditions(s);cardiac condition(s)  Goals    .  Patient Stated (pt-stated)      To continue to lose weiqght and stay within my weight goal of 182.      Depression Screen PHQ 2/9 Scores 03/17/2020 12/31/2018 07/19/2017 07/17/2016 04/07/2015 04/06/2015 11/19/2014  PHQ - 2 Score 0 0 0 0 0 0 0    Fall Risk Fall Risk  03/17/2020 12/31/2018 07/19/2017 07/19/2017 07/17/2016  Falls in the past year? 1 0 No No No  Number falls in past yr: 0 0 - - -  Injury with Fall? 0 0 - - -  Risk for fall due to : Impaired balance/gait - - - -  Risk for fall due to: Comment due to dizziness - - - -  Follow up Falls evaluation completed;Education provided Falls evaluation completed - - -    Any stairs in or around the home? Yes  If so, are there any without handrails? No  Home free of loose throw rugs in walkways, pet beds, electrical cords, etc? Yes  Adequate lighting in your home to reduce risk of falls? Yes   ASSISTIVE DEVICES UTILIZED TO PREVENT FALLS:  Life alert? No  Use of a cane, walker or w/c? Yes    Grab bars in the bathroom? No  Shower chair or bench in shower? No  Elevated toilet seat or a handicapped toilet? Yes   TIMED UP AND GO:  Was the test performed? No .  Length of time to ambulate 10 feet: 0 sec.   Gait steady and fast without use of assistive device  Cognitive Function: Patient is cogitatively intact.        Immunizations Immunization History  Administered Date(s) Administered  . Fluad Quad(high Dose 65+) 03/18/2019  . Influenza Split 06/28/2012  . Influenza, High Dose Seasonal PF 03/12/2014, 07/14/2016, 07/17/2017, 07/05/2018  . Influenza,inj,Quad PF,6+ Mos 06/27/2013, 04/06/2015  . Moderna SARS-COVID-2 Vaccination 08/15/2019, 09/12/2019  . Pneumococcal Conjugate-13 04/06/2015  . Pneumococcal Polysaccharide-23 07/17/2017  . Tdap 02/01/2007, 07/14/2016    TDAP status: Up to date Flu Vaccine status: Up to date Pneumococcal vaccine status: Up to date Covid-19 vaccine status: Completed vaccines  Qualifies for Shingles Vaccine? Yes   Zostavax completed Yes   Shingrix Completed?: No.    Education has been provided regarding the importance of this vaccine. Patient has been advised to call insurance company to determine out of pocket expense if they have not yet received this vaccine. Advised may also receive vaccine at local pharmacy or Health Dept. Verbalized acceptance and understanding.  Screening Tests Health Maintenance  Topic Date Due  . Hepatitis C Screening  Never done  . INFLUENZA VACCINE  01/19/2020  . Fecal DNA (Cologuard)  11/25/2022  . TETANUS/TDAP  07/14/2026  . COVID-19 Vaccine  Completed  . PNA vac Low Risk Adult  Completed    Health Maintenance  Health Maintenance Due  Topic Date Due  . Hepatitis C Screening  Never done  . INFLUENZA VACCINE  01/19/2020    Colorectal cancer screening: Completed 11/25/2019. Repeat every 3 years  Lung Cancer Screening: (Low Dose CT Chest recommended if Age 30-80 years, 30 pack-year currently smoking  OR have quit w/in 15years.) does not qualify.   Lung Cancer Screening Referral: no  Additional Screening:  Hepatitis C Screening: does qualify; Completed no  Vision Screening: Recommended annual ophthalmology exams for early detection of glaucoma and other disorders of the eye. Is the patient up to date with  their annual eye exam?  Yes  Who is the provider or what is the name of the office in which the patient attends annual eye exams? [facility] If pt is not established with a provider, would they like to be referred to a provider to establish care? No .   Dental Screening: Recommended annual dental exams for proper oral hygiene  Community Resource Referral / Chronic Care Management: CRR required this visit?  No   CCM required this visit?  No      Plan:     I have personally reviewed and noted the following in the patient's chart:   . Medical and social history . Use of alcohol, tobacco or illicit drugs  . Current medications and supplements . Functional ability and status . Nutritional status . Physical activity . Advanced directives . List of other physicians . Hospitalizations, surgeries, and ER visits in previous 12 months . Vitals . Screenings to include cognitive, depression, and falls . Referrals and appointments  In addition, I have reviewed and discussed with patient certain preventive protocols, quality metrics, and best practice recommendations. A written personalized care plan for preventive services as well as general preventive health recommendations were provided to patient.     Sheral Flow, LPN   [id number]/48/[id number]   Nurse Notes: n/a

## 2020-03-17 NOTE — Patient Instructions (Signed)
Samuel Fisher , Thank you for taking time to come for your Medicare Wellness Visit. I appreciate your ongoing commitment to your health goals. Please review the following plan we discussed and let me know if I can assist you in the future.   Screening recommendations/referrals: Colonoscopy: 11/25/2019; due every 3 years (Cologuard) Recommended yearly ophthalmology/optometry visit for glaucoma screening and checkup Recommended yearly dental visit for hygiene and checkup  Vaccinations: Influenza vaccine: 03/17/2020 Pneumococcal vaccine: completed Tdap vaccine: 07/14/2016; due every 10 years Shingles vaccine: never done; will check with local pharmacy   Covid-19: Completed Levan Hurst)  Advanced directives: Please bring a copy of your health care power of attorney and living will to the office at your convenience.  Conditions/risks identified: Yes; Reviewed health maintenance screenings with patient today and relevant education, vaccines, and/or referrals were provided. Please continue to do your personal lifestyle choices by: daily care of teeth and gums, regular physical activity (goal should be 5 days a week for 30 minutes), eat a healthy diet, avoid tobacco and drug use, limiting any alcohol intake, taking a low-dose aspirin (if not allergic or have been advised by your provider otherwise) and taking vitamins and minerals as recommended by your provider. Continue doing brain stimulating activities (puzzles, reading, adult coloring books, staying active) to keep memory sharp. Continue to eat heart healthy diet (full of fruits, vegetables, whole grains, lean protein, water--limit salt, fat, and sugar intake) and increase physical activity as tolerated.  Next appointment: Please schedule your next Medicare Wellness Visit with your Nurse Health Advisor in 1 year by calling [phone].  Preventive Care 71 Years and Older, Male Preventive care refers to lifestyle choices and visits with your health care  provider that can promote health and wellness. What does preventive care include?  A yearly physical exam. This is also called an annual well check.  Dental exams once or twice a year.  Routine eye exams. Ask your health care provider how often you should have your eyes checked.  Personal lifestyle choices, including:  Daily care of your teeth and gums.  Regular physical activity.  Eating a healthy diet.  Avoiding tobacco and drug use.  Limiting alcohol use.  Practicing safe sex.  Taking low doses of aspirin every day.  Taking vitamin and mineral supplements as recommended by your health care provider. What happens during an annual well check? The services and screenings done by your health care provider during your annual well check will depend on your age, overall health, lifestyle risk factors, and family history of disease. Counseling  Your health care provider may ask you questions about your:  Alcohol use.  Tobacco use.  Drug use.  Emotional well-being.  Home and relationship well-being.  Sexual activity.  Eating habits.  History of falls.  Memory and ability to understand (cognition).  Work and work Statistician. Screening  You may have the following tests or measurements:  Height, weight, and BMI.  Blood pressure.  Lipid and cholesterol levels. These may be checked every 5 years, or more frequently if you are over 18 years old.  Skin check.  Lung cancer screening. You may have this screening every year starting at age 51 if you have a 30-pack-year history of smoking and currently smoke or have quit within the past 15 years.  Fecal occult blood test (FOBT) of the stool. You may have this test every year starting at age 93.  Flexible sigmoidoscopy or colonoscopy. You may have a sigmoidoscopy every 5 years or a colonoscopy  every 10 years starting at age 36.  Prostate cancer screening. Recommendations will vary depending on your family history and  other risks.  Hepatitis C blood test.  Hepatitis B blood test.  Sexually transmitted disease (STD) testing.  Diabetes screening. This is done by checking your blood sugar (glucose) after you have not eaten for a while (fasting). You may have this done every 1-3 years.  Abdominal aortic aneurysm (AAA) screening. You may need this if you are a current or former smoker.  Osteoporosis. You may be screened starting at age 33 if you are at high risk. Talk with your health care provider about your test results, treatment options, and if necessary, the need for more tests. Vaccines  Your health care provider may recommend certain vaccines, such as:  Influenza vaccine. This is recommended every year.  Tetanus, diphtheria, and acellular pertussis (Tdap, Td) vaccine. You may need a Td booster every 10 years.  Zoster vaccine. You may need this after age 24.  Pneumococcal 13-valent conjugate (PCV13) vaccine. One dose is recommended after age 62.  Pneumococcal polysaccharide (PPSV23) vaccine. One dose is recommended after age 45. Talk to your health care provider about which screenings and vaccines you need and how often you need them. This information is not intended to replace advice given to you by your health care provider. Make sure you discuss any questions you have with your health care provider. Document Released: 07/03/2015 Document Revised: 02/24/2016 Document Reviewed: 04/07/2015 Elsevier Interactive Patient Education  2017 [place] Prevention in the Home Falls can cause injuries. They can happen to people of all ages. There are many things you can do to make your home safe and to help prevent falls. What can I do on the outside of my home?  Regularly fix the edges of walkways and driveways and fix any cracks.  Remove anything that might make you trip as you walk through a door, such as a raised step or threshold.  Trim any bushes or trees on the path to your  home.  Use bright outdoor lighting.  Clear any walking paths of anything that might make someone trip, such as rocks or tools.  Regularly check to see if handrails are loose or broken. Make sure that both sides of any steps have handrails.  Any raised decks and porches should have guardrails on the edges.  Have any leaves, snow, or ice cleared regularly.  Use sand or salt on walking paths during winter.  Clean up any spills in your garage right away. This includes oil or grease spills. What can I do in the bathroom?  Use night lights.  Install grab bars by the toilet and in the tub and shower. Do not use towel bars as grab bars.  Use non-skid mats or decals in the tub or shower.  If you need to sit down in the shower, use a plastic, non-slip stool.  Keep the floor dry. Clean up any water that spills on the floor as soon as it happens.  Remove soap buildup in the tub or shower regularly.  Attach bath mats securely with double-sided non-slip rug tape.  Do not have throw rugs and other things on the floor that can make you trip. What can I do in the bedroom?  Use night lights.  Make sure that you have a light by your bed that is easy to reach.  Do not use any sheets or blankets that are too big for your bed. They should  not hang down onto the floor.  Have a firm chair that has side arms. You can use this for support while you get dressed.  Do not have throw rugs and other things on the floor that can make you trip. What can I do in the kitchen?  Clean up any spills right away.  Avoid walking on wet floors.  Keep items that you use a lot in easy-to-reach places.  If you need to reach something above you, use a strong step stool that has a grab bar.  Keep electrical cords out of the way.  Do not use floor polish or wax that makes floors slippery. If you must use wax, use non-skid floor wax.  Do not have throw rugs and other things on the floor that can make you  trip. What can I do with my stairs?  Do not leave any items on the stairs.  Make sure that there are handrails on both sides of the stairs and use them. Fix handrails that are broken or loose. Make sure that handrails are as long as the stairways.  Check any carpeting to make sure that it is firmly attached to the stairs. Fix any carpet that is loose or worn.  Avoid having throw rugs at the top or bottom of the stairs. If you do have throw rugs, attach them to the floor with carpet tape.  Make sure that you have a light switch at the top of the stairs and the bottom of the stairs. If you do not have them, ask someone to add them for you. What else can I do to help prevent falls?  Wear shoes that:  Do not have high heels.  Have rubber bottoms.  Are comfortable and fit you well.  Are closed at the toe. Do not wear sandals.  If you use a stepladder:  Make sure that it is fully opened. Do not climb a closed stepladder.  Make sure that both sides of the stepladder are locked into place.  Ask someone to hold it for you, if possible.  Clearly mark and make sure that you can see:  Any grab bars or handrails.  First and last steps.  Where the edge of each step is.  Use tools that help you move around (mobility aids) if they are needed. These include:  Canes.  Walkers.  Scooters.  Crutches.  Turn on the lights when you go into a dark area. Replace any light bulbs as soon as they burn out.  Set up your furniture so you have a clear path. Avoid moving your furniture around.  If any of your floors are uneven, fix them.  If there are any pets around you, be aware of where they are.  Review your medicines with your doctor. Some medicines can make you feel dizzy. This can increase your chance of falling. Ask your doctor what other things that you can do to help prevent falls. This information is not intended to replace advice given to you by your health care provider. Make  sure you discuss any questions you have with your health care provider. Document Released: 04/02/2009 Document Revised: 11/12/2015 Document Reviewed: 07/11/2014 Elsevier Interactive Patient Education  2017 [employer].

## 2020-03-18 DIAGNOSIS — T50905A Adverse effect of unspecified drugs, medicaments and biological substances, initial encounter: Secondary | ICD-10-CM | POA: Insufficient documentation

## 2020-03-18 LAB — BASIC METABOLIC PANEL WITH GFR
BUN: 12 mg/dL (ref 7–25)
CO2: 28 mmol/L (ref 20–32)
Calcium: 10.4 mg/dL — ABNORMAL HIGH (ref 8.6–10.3)
Chloride: 97 mmol/L — ABNORMAL LOW (ref 98–110)
Creat: 1.04 mg/dL (ref 0.70–1.18)
GFR, Est African American: 83 mL/min/{1.73_m2} (ref >=60)
GFR, Est Non African American: 71 mL/min/{1.73_m2} (ref >=60)
Glucose, Bld: 107 mg/dL — ABNORMAL HIGH (ref 65–99)
Potassium: 4.5 mmol/L (ref 3.5–5.3)
Sodium: 133 mmol/L — ABNORMAL LOW (ref 135–146)

## 2020-03-18 LAB — CBC WITH DIFFERENTIAL/PLATELET
Absolute Monocytes: 736 cells/uL (ref 200–950)
Basophils Absolute: 32 cells/uL (ref 0–200)
Basophils Relative: 0.5 %
Eosinophils Absolute: 346 cells/uL (ref 15–500)
Eosinophils Relative: 5.4 %
HCT: 38.8 % (ref 38.5–50.0)
Hemoglobin: 13.4 g/dL (ref 13.2–17.1)
Lymphs Abs: 1478 cells/uL (ref 850–3900)
MCH: 32.9 pg (ref 27.0–33.0)
MCHC: 34.5 g/dL (ref 32.0–36.0)
MCV: 95.3 fL (ref 80.0–100.0)
MPV: 10 fL (ref 7.5–12.5)
Monocytes Relative: 11.5 %
Neutro Abs: 3808 cells/uL (ref 1500–7800)
Neutrophils Relative %: 59.5 %
Platelets: 218 10*3/uL (ref 140–400)
RBC: 4.07 10*6/uL — ABNORMAL LOW (ref 4.20–5.80)
RDW: 12.1 % (ref 11.0–15.0)
Total Lymphocyte: 23.1 %
WBC: 6.4 10*3/uL (ref 3.8–10.8)

## 2020-03-18 LAB — FOLATE: Folate: 13.9 ng/mL

## 2020-03-22 ENCOUNTER — Encounter: Payer: Self-pay | Admitting: Internal Medicine

## 2020-04-06 ENCOUNTER — Encounter: Payer: Self-pay | Admitting: Gastroenterology

## 2020-04-06 ENCOUNTER — Ambulatory Visit (INDEPENDENT_AMBULATORY_CARE_PROVIDER_SITE_OTHER): Payer: Medicare Other | Admitting: Gastroenterology

## 2020-04-06 VITALS — BP 150/90 | HR 92 | Ht 66.5 in | Wt 188.1 lb

## 2020-04-06 DIAGNOSIS — K862 Cyst of pancreas: Secondary | ICD-10-CM

## 2020-04-06 NOTE — Progress Notes (Addendum)
Referring Provider: [name], MD Primary Care Physician:  [name], MD  Reason for Consultation:  Abnormal pancreas   IMPRESSION:  Enlarging multiloculated cystic lesion in the pancreatic head  PLAN: Ca-19-9 now. If normal, repeat MRI/MRCP in 4-6 months. If elevated, will proceed with EUS now.    This plan was created on discussion with Dr. Rush Landmark, one of gastroenterologists who performs EUS  Please see the "Patient Instructions" section for addition details about the plan.  HPI: Samuel Fisher is a 74 y.o. male referred by Dr. Ronnald Ramp for further evaluation of an abnormal pancreas on MRI. The history is obtained through the patient and review of his electronic health record.  CT of the chest with contrast 06/05/2019 suggested a cystic lesion in the pancreatic tail.  An MRI was obtained for additional evaluation 07/09/2019. Follow-up MRI with and without contrast 01/29/2020 redemonstrated a multilobulated cystic lesion of the pancreatic tail measuring 2.0 x 1.5 cm. This was unchanged compared to the MRI from January. However, a multiloculated cystic lesion or adjacent lesions in the pancreatic head measuring approximately 1.7 x 1.3 cm have enlarged compared to the prior examination when they were 1.3 x 0.8 cm. There is no definitive communication with the pancreatic duct. No pancreatic ductal dilatation.  Labs 11/05/2019 show a normal comprehensive metabolic panel including normal liver enzymes. Hemoglobin was 12.1 with an MCV of 96.8 and an RDW of 12.9.  There is occasional cigar.  He drinks beer or cocktail every evening but denies any heavy alcohol use since college.  He has a 10-pack-year smoking history having quit at age 64.  There is no weight loss, anorexia, nausea, abdominal pain, jaundice, back pain, change in bowel habits. There is no history of smoking, obesity, or diabetes. No history of pancreatitis including hereditary pancreatitis, multiple endocrine  neoplasia, hereditary nonpolyposis rectal cancer, familial adenomatous polyposis, Gardner syndrome, familial atypical multiple mole melanoma syndrome, or germline mutations in the BRCA1 and BRCA2 genes.  No history of unexplained superficial thrombophlebitis.  No known family history of colon cancer or polyps.  Mother had uterine cancer.  Father had esophageal cancer.  No family history of pancreatic cancer or gastric/stomach cancer.  The patient reports having 2 normal screening colonoscopies with Dr. Earlean Shawl in the past.  Negative Cologuard earlier this year.   Past Medical History:  Diagnosis Date  . Allergy   . Arthritis   . BPH (benign prostatic hyperplasia) 2010  . Hypertension   . Inner ear dysfunction     Past Surgical History:  Procedure Laterality Date  . JOINT REPLACEMENT    . TONSILLECTOMY  1951  . TOTAL HIP ARTHROPLASTY     right-2002 and left-2004, 2010    Current Outpatient Medications  Medication Sig Dispense Refill  . amoxicillin (AMOXIL) 500 MG capsule Take 2,000 mg by mouth once as needed (for dentist appointment).    Marland Kitchen aspirin 325 MG tablet Take 325 mg by mouth every 4 (four) hours as needed for moderate pain or headache.     . Cholecalciferol 2000 units TABS Take 1 tablet (2,000 Units total) by mouth daily. 90 tablet 1  . EDARBYCLOR 40-12.5 MG TABS TAKE 1 TABLET BY MOUTH DAILY. 90 tablet 1  . fluticasone (FLONASE) 50 MCG/ACT nasal spray Place 1-2 sprays into both nostrils daily as needed. allergies  2  . rosuvastatin (CRESTOR) 5 MG tablet TAKE 1 TABLET ONCE DAILY. 90 tablet 0   Current Facility-Administered Medications  Medication Dose Route Frequency Provider  Last Rate Last Admin  . cyanocobalamin ((VITAMIN B-12)) injection 1,000 mcg  1,000 mcg Intramuscular Q30 days [name], MD   1,000 mcg at 12/26/19 1513    Allergies as of 04/06/2020 - Review Complete 03/22/2020  Allergen Reaction Noted  . Bee venom Anaphylaxis 12/30/2015    Family History   Problem Relation Age of Onset  . Arthritis Other   . Heart disease Other   . Hypertension Other   . Stroke Other   . Early death Other   . Mental illness Other   . Diabetes Neg Hx   . Hyperlipidemia Neg Hx   . Cancer Mother   . Cancer Father   . Hypertension Father     Social History   Socioeconomic History  . Marital status: Married    Spouse name: Not on file  . Number of children: Not on file  . Years of education: Not on file  . Highest education level: Not on file  Occupational History  . Not on file  Tobacco Use  . Smoking status: Former Smoker    Quit date: 06/28/1976    Years since quitting: 43.8  . Smokeless tobacco: Never Used  Substance and Sexual Activity  . Alcohol use: Yes    Alcohol/week: 3.0 standard drinks    Types: 3 Cans of beer per week  . Drug use: No  . Sexual activity: Yes  Other Topics Concern  . Not on file  Social History Narrative  . Not on file   Social Determinants of Health   Financial Resource Strain: Low Risk   . Difficulty of Paying Living Expenses: Not hard at all  Food Insecurity: No Food Insecurity  . Worried About Charity fundraiser in the Last Year: Never true  . Ran Out of Food in the Last Year: Never true  Transportation Needs: No Transportation Needs  . Lack of Transportation (Medical): No  . Lack of Transportation (Non-Medical): No  Physical Activity: Sufficiently Active  . Days of Exercise per Week: 5 days  . Minutes of Exercise per Session: 60 min  Stress: No Stress Concern Present  . Feeling of Stress : Not at all  Social Connections: Socially Integrated  . Frequency of Communication with Friends and Family: More than three times a week  . Frequency of Social Gatherings with Friends and Family: More than three times a week  . Attends Religious Services: More than 4 times per year  . Active Member of Clubs or Organizations: Yes  . Attends Archivist Meetings: More than 4 times per year  . Marital  Status: Married  Human resources officer Violence: Not At Risk  . Fear of Current or Ex-Partner: No  . Emotionally Abused: No  . Physically Abused: No  . Sexually Abused: No    Review of Systems: 12 system ROS is negative except as noted above.   Physical Exam: General:   Alert,  well-nourished, pleasant and cooperative in NAD Head:  Normocephalic and atraumatic. Eyes:  Sclera clear, no icterus.   Conjunctiva pink. Ears:  Normal auditory acuity. Nose:  No deformity, discharge,  or lesions. Mouth:  No deformity or lesions.   Neck:  Supple; no masses or thyromegaly. Lungs:  Clear throughout to auscultation.   No wheezes. Heart:  Regular rate and rhythm; no murmurs. Abdomen:  Soft, nontender, nondistended, normal bowel sounds, no rebound or guarding. No hepatosplenomegaly.  No inguinal or umbilical lymphadenopathy. Rectal:  Deferred  Msk:  Symmetrical. No boney deformities  LAD: No inguinal or umbilical LAD Extremities:  No clubbing or edema. Neurologic:  Alert and  oriented x4;  grossly nonfocal Skin:  Intact without significant lesions or rashes. Psych:  Alert and cooperative. Normal mood and affect.     Alayasia Breeding L. Tarri Glenn, MD, MPH 04/06/2020, 8:27 AM

## 2020-04-06 NOTE — Patient Instructions (Signed)
If you are age 72 or older, your body mass index should be between 23-30. Your Body mass index is 29.91 kg/m. If this is out of the aforementioned range listed, please consider follow up with your Primary Care Provider.  If you are age 24 or younger, your body mass index should be between 19-25. Your Body mass index is 29.91 kg/m. If this is out of the aformentioned range listed, please consider follow up with your Primary Care Provider.   We will contact you to schedule an EUS. Please call us if you don't hear from [place] in a week [phone]   Thank you for trusting me with your gastrointestinal care!    Thornton Park, MD, MPH you

## 2020-04-07 ENCOUNTER — Other Ambulatory Visit: Payer: Self-pay

## 2020-04-07 DIAGNOSIS — K862 Cyst of pancreas: Secondary | ICD-10-CM

## 2020-04-09 ENCOUNTER — Telehealth: Payer: Self-pay

## 2020-04-09 ENCOUNTER — Telehealth: Payer: Self-pay | Admitting: Gastroenterology

## 2020-04-09 NOTE — Telephone Encounter (Signed)
Many, many thanks.

## 2020-04-09 NOTE — Telephone Encounter (Signed)
-----   Message from Thornton Park, MD sent at 04/07/2020  4:22 PM EDT ----- Regarding: Next step to evaluate pancreatic cystic lesion Discussed with Dr. Rush Landmark. Will plan Ca-19-9 now. If elevated, proceed with EUS. If negative, repeat MRI/MRCP in 4-6 months. I called the patient to try to discuss with him. But, he is traveling. I left a message but wanted to make you aware in the event that he calls back. Thank you.

## 2020-04-09 NOTE — Telephone Encounter (Signed)
See note below from Dr. Tarri Glenn:  Discussed with Dr. Rush Landmark. Will plan Ca-19-9 now. If elevated, proceed with EUS. If negative, repeat MRI/MRCP in 4-6 months. I called the patient to try to discuss with him. But, he is traveling. I left a message but wanted to make you aware in the event that he calls back. Thank you.   Left message for pt to call back.

## 2020-04-09 NOTE — Telephone Encounter (Signed)
I spoke to [name].  He is coming for labs in the morning.  He agrees with the plan and will await lab results for the next recommendation.

## 2020-04-10 ENCOUNTER — Other Ambulatory Visit: Payer: Medicare Other

## 2020-04-10 DIAGNOSIS — K862 Cyst of pancreas: Secondary | ICD-10-CM

## 2020-04-12 LAB — CA 19-9 (SERIAL): CA 19-9: 7 U/mL (ref 0–35)

## 2020-04-14 NOTE — Telephone Encounter (Signed)
Reminder in epic for MR/MRCP.

## 2020-04-16 ENCOUNTER — Ambulatory Visit (INDEPENDENT_AMBULATORY_CARE_PROVIDER_SITE_OTHER): Payer: Medicare Other

## 2020-04-16 ENCOUNTER — Other Ambulatory Visit: Payer: Self-pay

## 2020-04-16 DIAGNOSIS — Z23 Encounter for immunization: Secondary | ICD-10-CM

## 2020-04-16 DIAGNOSIS — E538 Deficiency of other specified B group vitamins: Secondary | ICD-10-CM

## 2020-04-16 NOTE — Progress Notes (Signed)
Pt here for monthly B12 injection per   B12 1000mcg given IM, and pt tolerated injection well.   

## 2020-05-11 ENCOUNTER — Encounter: Payer: Self-pay | Admitting: Internal Medicine

## 2020-05-22 ENCOUNTER — Telehealth: Payer: Self-pay | Admitting: Internal Medicine

## 2020-05-22 NOTE — Telephone Encounter (Signed)
Patient called to schedule an appointment for a calcium recheck but the lab orders aren't in. He said he would go to the [place] location next week to have them drawn.

## 2020-05-22 NOTE — Telephone Encounter (Signed)
Lab order entered.

## 2020-05-28 ENCOUNTER — Other Ambulatory Visit: Payer: Medicare Other

## 2020-05-28 DIAGNOSIS — T50905A Adverse effect of unspecified drugs, medicaments and biological substances, initial encounter: Secondary | ICD-10-CM

## 2020-05-28 LAB — BASIC METABOLIC PANEL
BUN: 15 mg/dL (ref 6–23)
CO2: 28 mEq/L (ref 19–32)
Calcium: 10.2 mg/dL (ref 8.4–10.5)
Chloride: 92 mEq/L — ABNORMAL LOW (ref 96–112)
Creatinine, Ser: 1.03 mg/dL (ref 0.40–1.50)
GFR: 72.46 mL/min (ref >60.00)
Glucose, Bld: 115 mg/dL — ABNORMAL HIGH (ref 70–99)
Potassium: 4.3 mEq/L (ref 3.5–5.1)
Sodium: 129 mEq/L — ABNORMAL LOW (ref 135–145)

## 2020-06-08 ENCOUNTER — Ambulatory Visit (INDEPENDENT_AMBULATORY_CARE_PROVIDER_SITE_OTHER): Payer: Medicare Other

## 2020-06-08 ENCOUNTER — Other Ambulatory Visit: Payer: Self-pay

## 2020-06-08 DIAGNOSIS — E538 Deficiency of other specified B group vitamins: Secondary | ICD-10-CM

## 2020-06-08 MED ORDER — CYANOCOBALAMIN 1000 MCG/ML IJ SOLN
1000.0000 ug | INTRAMUSCULAR | Status: DC
Start: 2020-06-08 — End: 2021-01-20
  Administered 2020-06-08 – 2021-01-18 (×5): 1000 ug via INTRAMUSCULAR

## 2020-06-08 NOTE — Progress Notes (Signed)
Pt here for monthly B12 injection per Dr Jones.  B12 1000mcg given IM left deltoid and pt tolerated injection well.  Pt to schedule next B12 injection upon check out.    

## 2020-07-02 ENCOUNTER — Other Ambulatory Visit: Payer: Self-pay | Admitting: Internal Medicine

## 2020-07-27 DIAGNOSIS — H9191 Unspecified hearing loss, right ear: Secondary | ICD-10-CM | POA: Diagnosis not present

## 2020-07-27 DIAGNOSIS — Z09 Encounter for follow-up examination after completed treatment for conditions other than malignant neoplasm: Secondary | ICD-10-CM | POA: Diagnosis not present

## 2020-07-27 DIAGNOSIS — Z86018 Personal history of other benign neoplasm: Secondary | ICD-10-CM | POA: Diagnosis not present

## 2020-07-27 DIAGNOSIS — D333 Benign neoplasm of cranial nerves: Secondary | ICD-10-CM | POA: Diagnosis not present

## 2020-07-27 DIAGNOSIS — G510 Bell's palsy: Secondary | ICD-10-CM | POA: Diagnosis not present

## 2020-07-27 DIAGNOSIS — Z9889 Other specified postprocedural states: Secondary | ICD-10-CM | POA: Diagnosis not present

## 2020-08-04 DIAGNOSIS — H90A22 Sensorineural hearing loss, unilateral, left ear, with restricted hearing on the contralateral side: Secondary | ICD-10-CM | POA: Diagnosis not present

## 2020-08-10 ENCOUNTER — Other Ambulatory Visit: Payer: Self-pay

## 2020-08-10 ENCOUNTER — Ambulatory Visit (INDEPENDENT_AMBULATORY_CARE_PROVIDER_SITE_OTHER): Payer: Medicare Other

## 2020-08-10 DIAGNOSIS — E538 Deficiency of other specified B group vitamins: Secondary | ICD-10-CM | POA: Diagnosis not present

## 2020-08-10 MED ORDER — CYANOCOBALAMIN 1000 MCG/ML IJ SOLN
1000.0000 ug | Freq: Once | INTRAMUSCULAR | Status: AC
  Administered 2020-08-10: 1000 ug via INTRAMUSCULAR

## 2020-08-10 NOTE — Progress Notes (Signed)
B12 given Please cosign 

## 2020-08-18 ENCOUNTER — Other Ambulatory Visit: Payer: Self-pay

## 2020-08-18 DIAGNOSIS — K862 Cyst of pancreas: Secondary | ICD-10-CM

## 2020-09-08 ENCOUNTER — Other Ambulatory Visit: Payer: Self-pay | Admitting: Gastroenterology

## 2020-09-08 ENCOUNTER — Other Ambulatory Visit: Payer: Self-pay

## 2020-09-08 ENCOUNTER — Ambulatory Visit (HOSPITAL_COMMUNITY)
Admission: RE | Admit: 2020-09-08 | Discharge: 2020-09-08 | Disposition: A | Discharge status: Home / Self Care | Payer: Medicare Other | Source: Ambulatory Visit | Attending: Gastroenterology | Admitting: Gastroenterology

## 2020-09-08 DIAGNOSIS — K862 Cyst of pancreas: Secondary | ICD-10-CM | POA: Diagnosis not present

## 2020-09-08 MED ORDER — GADOBUTROL 1 MMOL/ML IV SOLN
9.0000 mL | Freq: Once | INTRAVENOUS | Status: AC | PRN
  Administered 2020-09-08: 9 mL via INTRAVENOUS

## 2020-09-15 ENCOUNTER — Other Ambulatory Visit: Payer: Self-pay

## 2020-09-16 ENCOUNTER — Ambulatory Visit (INDEPENDENT_AMBULATORY_CARE_PROVIDER_SITE_OTHER): Payer: Medicare Other

## 2020-09-16 DIAGNOSIS — D519 Vitamin B12 deficiency anemia, unspecified: Secondary | ICD-10-CM | POA: Diagnosis not present

## 2020-09-16 DIAGNOSIS — E538 Deficiency of other specified B group vitamins: Secondary | ICD-10-CM | POA: Diagnosis not present

## 2020-09-16 NOTE — Progress Notes (Signed)
Pt here for monthly B12 injection per Dr Ronnald Ramp.  B12 1053mcg given IM right deltoid and pt tolerated injection well.  Pt states he will call to schedule next B12 injection.

## 2020-10-07 ENCOUNTER — Other Ambulatory Visit: Payer: Self-pay | Admitting: Internal Medicine

## 2020-10-16 DIAGNOSIS — H903 Sensorineural hearing loss, bilateral: Secondary | ICD-10-CM | POA: Insufficient documentation

## 2020-10-19 ENCOUNTER — Ambulatory Visit: Payer: Medicare Other | Admitting: Gastroenterology

## 2020-10-19 ENCOUNTER — Encounter: Payer: Self-pay | Admitting: Gastroenterology

## 2020-10-19 VITALS — BP 130/80 | HR 92 | Ht 66.5 in | Wt 189.6 lb

## 2020-10-19 DIAGNOSIS — K862 Cyst of pancreas: Secondary | ICD-10-CM | POA: Diagnosis not present

## 2020-10-19 DIAGNOSIS — H903 Sensorineural hearing loss, bilateral: Secondary | ICD-10-CM | POA: Diagnosis not present

## 2020-10-19 NOTE — Patient Instructions (Addendum)
It was a pleasure to see you today. Based on our discussion, I am providing you with my recommendations below:  RECOMMENDATION(S):    I am recommending a follow up MRI/MRCP in September 2022.  IMAGING:  . You will be contacted by [facility] (Your caller ID will indicate phone # [phone]) to schedule your MRI/MRCP. If you have not heard from them, please call [place] at [phone] to follow up on the status of your appointment.    FOLLOW UP:  . I would like for you to follow up with me in September, following your MRI/MRCP. Please call the office at [phone] to schedule your appointment.  BMI:  . If you are age 30 or older, your body mass index should be between 23-30. Your There is no height or weight on file to calculate BMI. If this is out of the aforementioned range listed, please consider follow up with your Primary Care Provider.  Thank you for trusting me with your gastrointestinal care!    Thornton Park, MD, MPH

## 2020-10-19 NOTE — Progress Notes (Signed)
Referring Provider: [name], MD Primary Care Physician:  [name], MD  Reason for Consultation:  Abnormal pancreas   IMPRESSION:  Multiloculated cystic lesion in the pancreatic head with normal Ca-19-9, last imaged by MRI/MRCP 08/2020. Had looked like it had increased in size on prior imaging, but, stable as of 08/2020. He has no ongoing GI symptoms.   PLAN: - MRI/MRCP in September 2022 - Follow-up in September following the MRI  This plan was created on discussion with Dr. Rush Landmark, one of gastroenterologists who performs EUS  Please see the "Patient Instructions" section for addition details about the plan.  HPI: Samuel Fisher is a 75 y.o. male who returns in follow-up for an abnormal pancreas on MRI. The history is obtained through the patient and review of his electronic health record.  CT of the chest with contrast 06/05/2019 suggested a cystic lesion in the pancreatic tail.  An MRI was obtained for additional evaluation 07/09/2019. Follow-up MRI with and without contrast 01/29/2020 redemonstrated a multilobulated cystic lesion of the pancreatic tail measuring 2.0 x 1.5 cm. This was unchanged compared to the MRI from January. However, a multiloculated cystic lesion or adjacent lesions in the pancreatic head measuring approximately 1.7 x 1.3 cm have enlarged compared to the prior examination when they were 1.3 x 0.8 cm. There is no definitive communication with the pancreatic duct. No pancreatic ductal dilatation.  Labs 11/05/2019 show a normal comprehensive metabolic panel including normal liver enzymes. Hemoglobin was 12.1 with an MCV of 96.8 and an RDW of 12.9.  Ca-19-9 normal at 7 04/10/20  MRI with and without MRCP 09/08/2020 showed no significant change in the size or the appearance of multiple cystic pancreatic lesions noted which demonstrate no enhancing component or thickened septation.  No pancreatic ductal dilatation.  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  There  is no weight loss, anorexia, nausea, abdominal pain, jaundice, back pain, change in bowel habits. There is no family history of pancreatic cancer.   The patient reports having 2 normal screening colonoscopies with Dr. Earlean Shawl in the past.  Negative Cologuard earlier this year.   Past Medical History:  Diagnosis Date  . Allergy   . Arthritis   . BPH (benign prostatic hyperplasia) 2010  . Brain tumor ([place])   . Hearing problem   . Hyperlipidemia   . Hypertension   . Inner ear dysfunction     Past Surgical History:  Procedure Laterality Date  . BRAIN TUMOR EXCISION    . NERVE GRAFT    . TONSILLECTOMY  1951  . TOTAL HIP ARTHROPLASTY     right-2002 and left-2004, 2010    Current Outpatient Medications  Medication Sig Dispense Refill  . amoxicillin (AMOXIL) 500 MG capsule Take 2,000 mg by mouth once as needed (for dentist appointment).    Marland Kitchen aspirin 325 MG tablet Take 325 mg by mouth every 4 (four) hours as needed for moderate pain or headache.     . Cyanocobalamin (VITAMIN B-12 IJ) Inject 1,000 mcg as directed every 30 (thirty) days.    Marland Kitchen EDARBYCLOR 40-12.5 MG TABS TAKE 1 TABLET BY MOUTH DAILY. 30 tablet 2  . fluticasone (FLONASE) 50 MCG/ACT nasal spray Place 1-2 sprays into both nostrils daily as needed. allergies  2   Current Facility-Administered Medications  Medication Dose Route Frequency Provider Last Rate Last Admin  . cyanocobalamin ((VITAMIN B-12)) injection 1,000 mcg  1,000 mcg Intramuscular Q30 days [name], MD   1,000 mcg at 04/16/20 0943  .  cyanocobalamin ((VITAMIN B-12)) injection 1,000 mcg  1,000 mcg Intramuscular Q30 days [name], MD   1,000 mcg at 09/16/20 1008    Allergies as of 10/19/2020 - Review Complete 10/19/2020  Allergen Reaction Noted  . Bee venom Anaphylaxis 12/30/2015    Family History  Problem Relation Age of Onset  . Uterine cancer Mother   . Rheum arthritis Mother   . Hypertension Father   . Throat cancer Father        smoker   . Arthritis Other   . Heart disease Other   . Hypertension Other   . Stroke Other   . Early death Other   . Mental illness Other   . Heart attack Brother        smoker  . Heart attack Maternal Grandfather   . Hypertension Brother   . Stroke Brother   . Diabetes Neg Hx   . Hyperlipidemia Neg Hx       Physical Exam: General:   Alert,  well-nourished, pleasant and cooperative in NAD Head:  Normocephalic and atraumatic. Eyes:  Sclera clear, no icterus.   Conjunctiva pink. Abdomen:  Soft, nontender, nondistended, normal bowel sounds, no rebound or guarding. No hepatosplenomegaly.  No inguinal or umbilical lymphadenopathy. Rectal:  Deferred  Msk:  Symmetrical. No boney deformities LAD: No inguinal or umbilical LAD Extremities:  No clubbing or edema. Neurologic:  Alert and  oriented x4;  grossly nonfocal Skin:  Intact without significant lesions or rashes. Psych:  Alert and cooperative. Normal mood and affect.     An Lannan L. Tarri Glenn, MD, MPH 10/19/2020, 1:52 PM

## 2020-10-29 ENCOUNTER — Ambulatory Visit (INDEPENDENT_AMBULATORY_CARE_PROVIDER_SITE_OTHER): Payer: Medicare Other

## 2020-10-29 ENCOUNTER — Other Ambulatory Visit: Payer: Self-pay

## 2020-10-29 DIAGNOSIS — D519 Vitamin B12 deficiency anemia, unspecified: Secondary | ICD-10-CM | POA: Diagnosis not present

## 2020-10-29 DIAGNOSIS — E538 Deficiency of other specified B group vitamins: Secondary | ICD-10-CM

## 2020-10-29 NOTE — Progress Notes (Addendum)
Pt here for monthly B12 injection per Dr Ronnald Ramp.  B12 1063mcg given IM left deltoid and pt tolerated injection well.  Pt declines to schedule next b12 at this time; states he will call when he is ready.  Pt has been informed that the recommended vaccine Shingrix may not be covered under their current Medicare insurance. Discussed that they will possibly incur a bill for the Shingrix vaccine & administration fee.  Pt understands that if they want the Shingrix vaccine, Medicare will be billed for an official decision on payment, which will be sent to them in a Medicare Summary Notice (MSN). They understand that if Medicare does not pay, they are responsible for payment.   At this time, the patient decides they will not get the Shringrix vaccine based on the information above; states he will get it at his local pharmacy.   I have reviewed and agree

## 2020-11-17 ENCOUNTER — Telehealth: Payer: Self-pay

## 2020-11-17 NOTE — Telephone Encounter (Signed)
Key: [id number]

## 2020-12-13 IMAGING — MR MR ABDOMEN WO/W CM
18 of 20 series · 43 of 48 positions shown · IV contrast (20ml Multihance)
Comparison: None.

CLINICAL DATA: Follow-up pancreatic head lesion

EXAM:
MRI ABDOMEN WITHOUT AND WITH CONTRAST
TECHNIQUE: Multiplanar multisequence MR imaging of the abdomen was performed
both before and after the administration of intravenous contrast.
CONTRAST:  20mL MULTIHANCE GADOBENATE DIMEGLUMINE 529 MG/ML IV SOLN

[Series 2: T2 · coronal · 5.0mm · 1.56mm/px · 1 of 44 slices shown (1 of 4)]
[im 1/44]
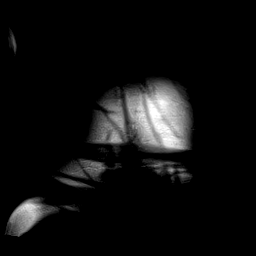

[Series 3: T1 · axial · 3.0mm · 1.48mm/px · z∈[-60,+153]mm · 4 of 144 slices shown]
[im 1/144]
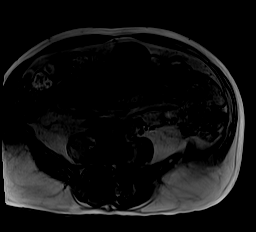
[im 48/144]
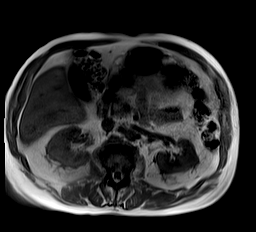
[im 96/144]
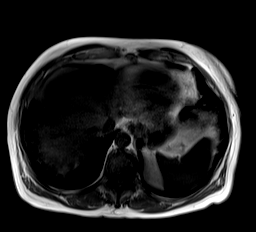
[im 144/144]
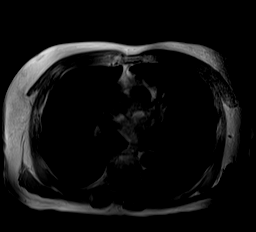

[Series 4: T2 · axial · 5.0mm · 1.48mm/px · 1 of 38 slices shown (2 of 4)]
[im 1/38]
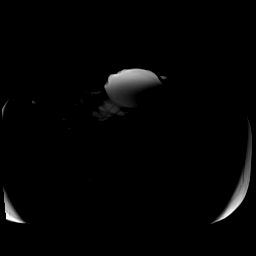

[Series 5: DWI · axial · 5.0mm · 1.42mm/px · z∈[-82,+176]mm · 4 of 132 slices shown (1 of 2)]
[im 1/132]
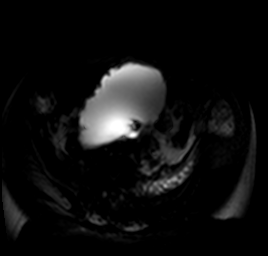
[im 44/132]
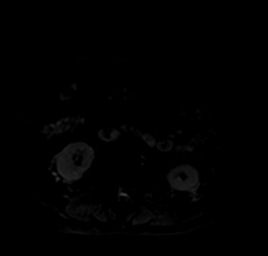
[im 88/132]
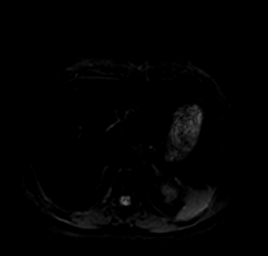
[im 132/132]
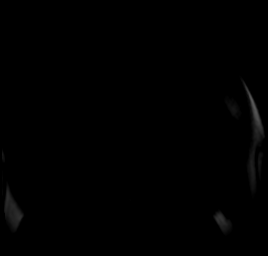

[Series 6: DWI · axial · 5.0mm · 1.42mm/px · z∈[-82,+176]mm · 2 of 44 slices shown (2 of 2)]
[im 1/44]
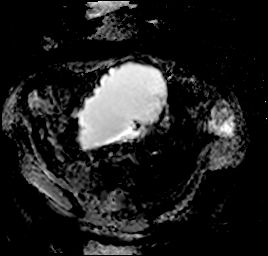
[im 44/44]
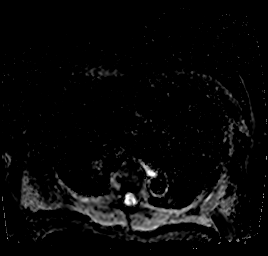

[Series 7: T2 · axial · 6.0mm · 1.22mm/px · 1 of 35 slices shown (3 of 4)]
[im 1/35]
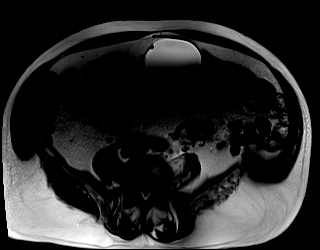

[Series 8: bSSFP · axial · 5.0mm · 1.32mm/px · 1 of 38 slices shown]
[im 1/38]
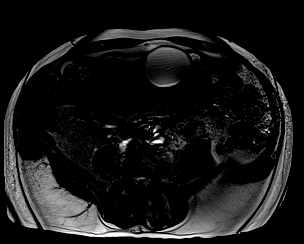

[Series 11: T2 · coronal · 3.0mm · 1.98mm/px · 1 of 22 slices shown (4 of 4)]
[im 1/22]
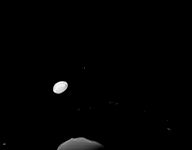

[Series 12: MRCP · coronal · 1.0mm · 0.49mm/px · 2 of 64 slices shown]
[im 1/64]
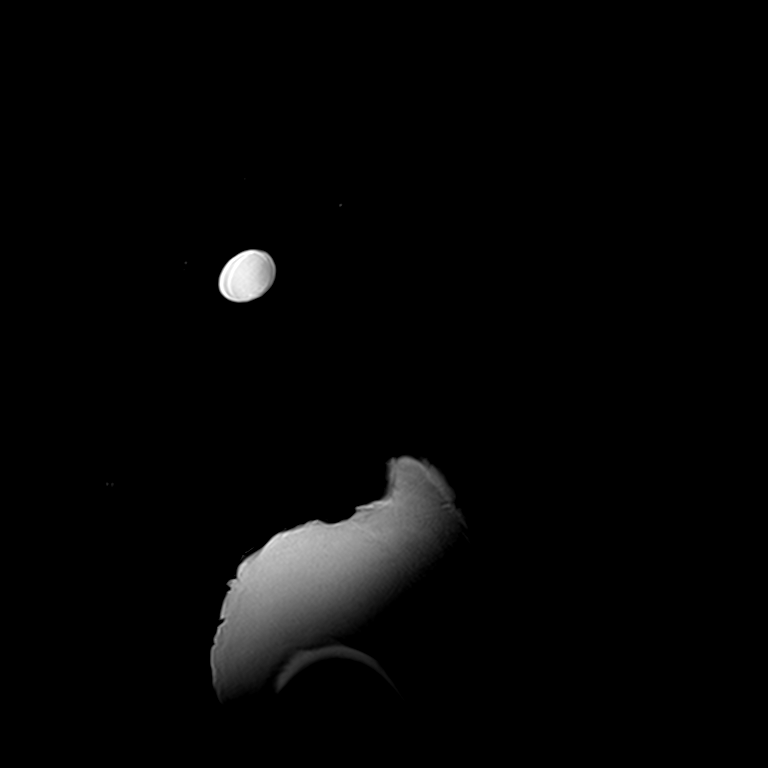
[im 64/64]
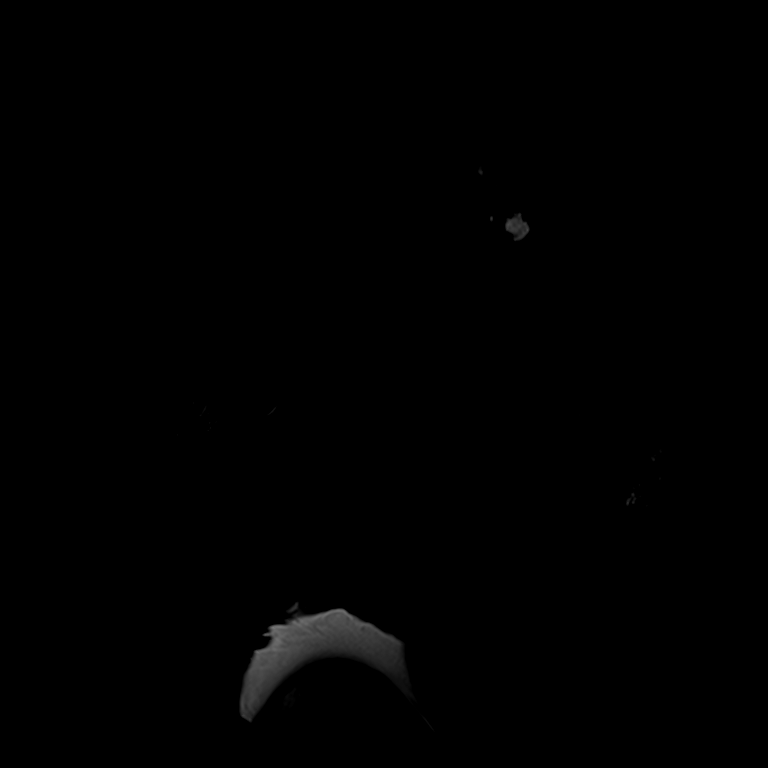

[Series 14: T1 dynamic · axial · non-contrast · 3.0mm · 1.25mm/px · z∈[-57,+156]mm · 3 of 72 slices shown]
[im 1/72]
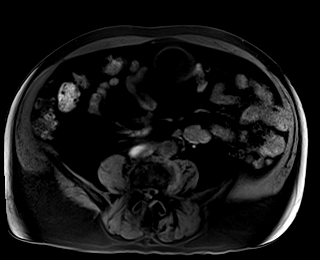
[im 36/72]
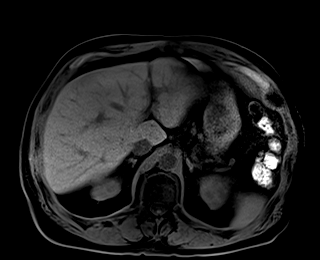
[im 72/72]
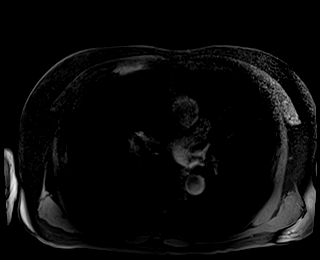

[Series 15: T1 dynamic post-contrast · axial · 3.0mm · 1.25mm/px · z∈[-57,+156]mm · 3 of 72 slices shown (1 of 8)]
[im 1/72]
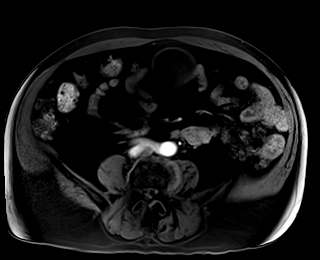
[im 36/72]
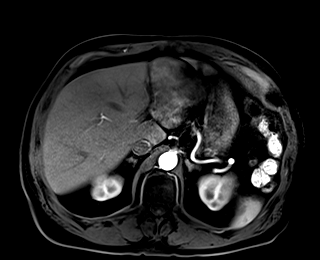
[im 72/72]
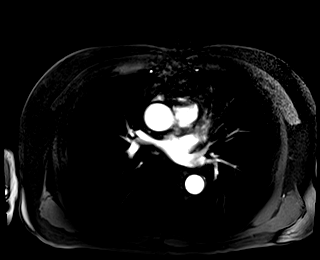

[Series 16: T1 dynamic post-contrast · axial · 3.0mm · 1.25mm/px · z∈[-57,+156]mm · 3 of 72 slices shown (2 of 8)]
[im 1/72]
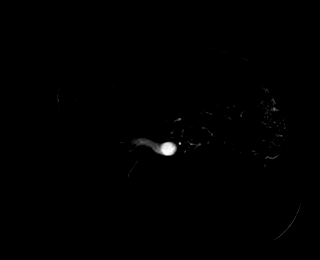
[im 36/72]
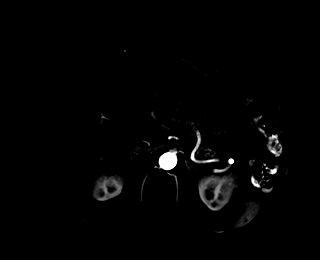
[im 72/72]
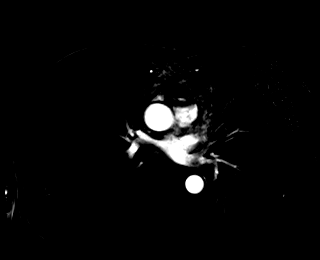

[Series 17: T1 dynamic post-contrast · axial · 3.0mm · 1.25mm/px · z∈[-57,+156]mm · 3 of 72 slices shown (3 of 8)]
[im 1/72]
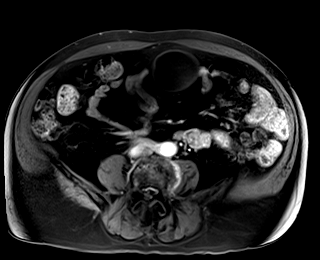
[im 36/72]
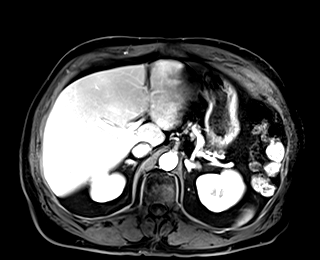
[im 72/72]
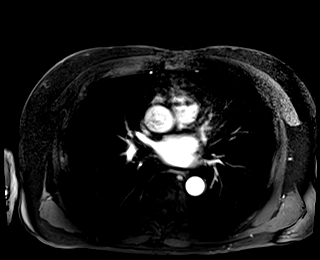

[Series 18: T1 dynamic post-contrast · axial · 3.0mm · 1.25mm/px · z∈[-57,+156]mm · 3 of 72 slices shown (4 of 8)]
[im 1/72]
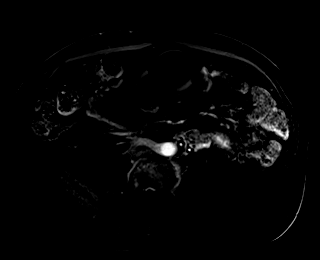
[im 36/72]
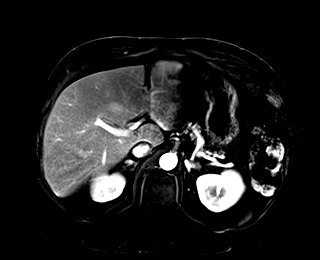
[im 72/72]
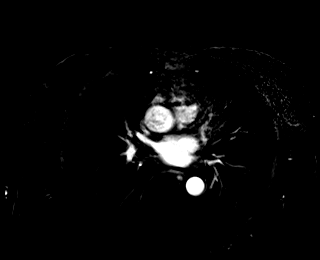

[Series 20: T1 dynamic post-contrast · axial · 3.0mm · 1.25mm/px · z∈[-57,+156]mm · 3 of 72 slices shown (5 of 8)]
[im 1/72]
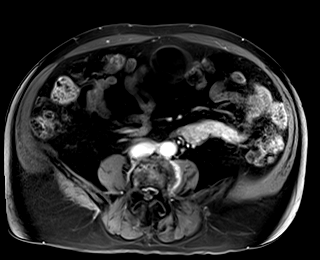
[im 36/72]
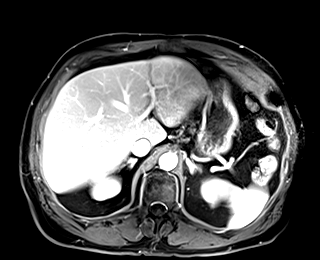
[im 72/72]
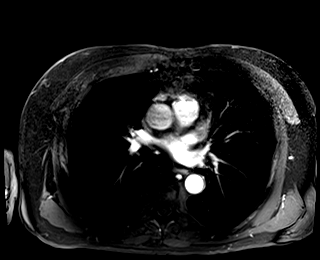

[Series 21: T1 dynamic post-contrast · axial · 3.0mm · 1.25mm/px · z∈[-57,+156]mm · 3 of 72 slices shown (6 of 8)]
[im 1/72]
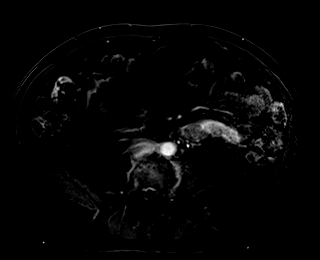
[im 36/72]
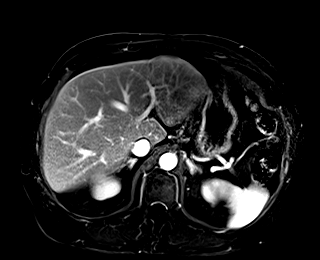
[im 72/72]
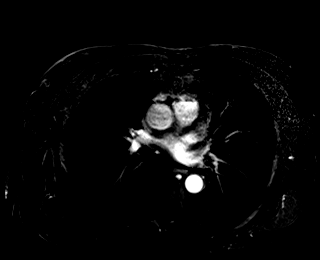

[Series 22: T1 dynamic post-contrast · coronal · 3.0mm · 1.25mm/px · 3 of 88 slices shown (7 of 8)]
[im 1/88]
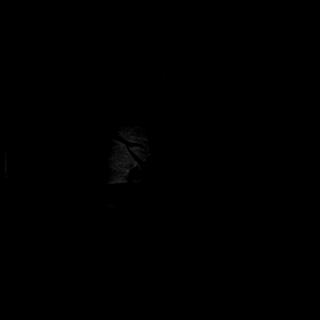
[im 44/88]
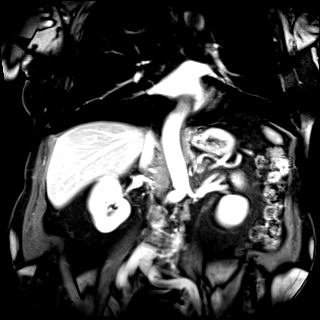
[im 88/88]
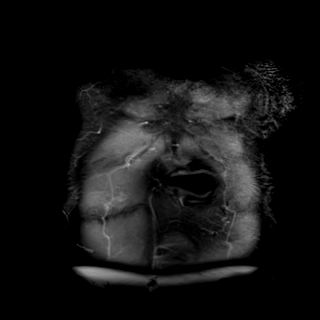

[Series 23: T1 dynamic post-contrast · axial · 3.0mm · 1.25mm/px · z∈[-57,+48]mm · 2 of 72 slices shown (8 of 8)]
[im 1/72]
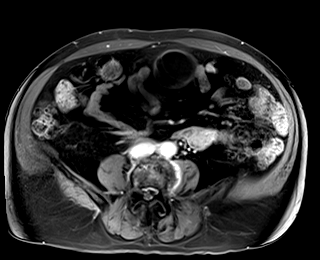
[im 36/72]
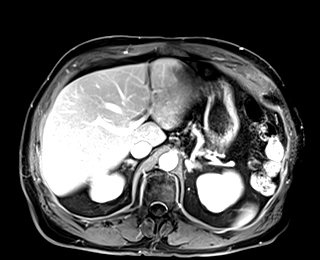

[43 of 48 positions shown; findings below may reference images not displayed]

FINDINGS: Lower chest: No acute findings.

Hepatobiliary: No mass or other parenchymal abnormality identified.

Pancreas: There is a redemonstrated multilobulated fluid signal
lesion of the pancreatic tail measuring 2.0 x 1.5 cm, unchanged
compared to prior examination (series 7, image 20). Redemonstrated
multiloculated cystic lesion or adjacent lesions in the pancreatic
head measuring approximately 1.7 x 1.3 cm, which are enlarged
compared to prior examination, at that time measuring approximately
1.3 x 0.8 cm (series 7, image 26, series 12, image 30). No solid
contrast enhancing component, inflammatory changes, or other
parenchymal abnormality identified. No definitive communication of
either these lesions to the main pancreatic duct. There are numerous
additional tiny fluid signal lesions throughout the pancreas
measuring no greater than 3-4 mm, for example in the pancreatic neck
(series 3, image 20). No pancreatic ductal dilatation.

Spleen:  Within normal limits in size and appearance.

Adrenals/Urinary Tract: No masses identified. No evidence of
hydronephrosis.

Stomach/Bowel: Visualized portions within the abdomen are
unremarkable.

Vascular/Lymphatic: No pathologically enlarged lymph nodes
identified. No abdominal aortic aneurysm demonstrated.

Other:  Distended urinary bladder.

Musculoskeletal: No suspicious bone lesions identified.
IMPRESSION: 1. Redemonstrated multilobulated cystic lesion of the pancreatic
tail measuring 2.0 x 1.5 cm, unchanged compared to prior
examination.
2. Redemonstrated multiloculated cystic lesion or adjacent lesions
in the pancreatic head measuring approximately 1.7 x 1.3 cm in
total, which are enlarged compared to prior examination, at that
time measuring approximately 1.3 x 0.8 cm. Differential
considerations include pancreatic pseudocyst versus cystic neoplasm.
Given significant interval enlargement recommend EUS and FNA. This
recommendation follows ACR consensus guidelines: Management of
Incidental Pancreatic Cysts: A White Paper of the ACR Incidental
Findings Committee. [HOSPITAL] 7579;[DATE].
3. Distended urinary bladder; similar in appearance to prior
examination, correlate for urinary retention.

## 2020-12-17 ENCOUNTER — Other Ambulatory Visit: Payer: Self-pay

## 2020-12-17 ENCOUNTER — Ambulatory Visit (INDEPENDENT_AMBULATORY_CARE_PROVIDER_SITE_OTHER): Payer: Medicare Other

## 2020-12-17 DIAGNOSIS — E538 Deficiency of other specified B group vitamins: Secondary | ICD-10-CM

## 2020-12-17 DIAGNOSIS — D519 Vitamin B12 deficiency anemia, unspecified: Secondary | ICD-10-CM | POA: Diagnosis not present

## 2020-12-17 NOTE — Progress Notes (Signed)
Pt here for monthly B12 injection per Dr Ronnald Ramp.  B12 1024mcg given IM left deltoid and pt tolerated injection well.  Pt declines to schedule next appt at this time; will call.

## 2020-12-29 DIAGNOSIS — R972 Elevated prostate specific antigen [PSA]: Secondary | ICD-10-CM | POA: Diagnosis not present

## 2020-12-29 LAB — PSA: PSA: 8.44

## 2021-01-05 DIAGNOSIS — R972 Elevated prostate specific antigen [PSA]: Secondary | ICD-10-CM | POA: Diagnosis not present

## 2021-01-05 DIAGNOSIS — Z125 Encounter for screening for malignant neoplasm of prostate: Secondary | ICD-10-CM | POA: Diagnosis not present

## 2021-01-05 DIAGNOSIS — N5201 Erectile dysfunction due to arterial insufficiency: Secondary | ICD-10-CM | POA: Diagnosis not present

## 2021-01-12 ENCOUNTER — Other Ambulatory Visit: Payer: Self-pay | Admitting: Internal Medicine

## 2021-01-18 ENCOUNTER — Ambulatory Visit (INDEPENDENT_AMBULATORY_CARE_PROVIDER_SITE_OTHER): Payer: Medicare Other | Admitting: *Deleted

## 2021-01-18 ENCOUNTER — Other Ambulatory Visit: Payer: Self-pay

## 2021-01-18 DIAGNOSIS — E538 Deficiency of other specified B group vitamins: Secondary | ICD-10-CM | POA: Diagnosis not present

## 2021-01-18 DIAGNOSIS — D518 Other vitamin B12 deficiency anemias: Secondary | ICD-10-CM | POA: Diagnosis not present

## 2021-01-18 NOTE — Progress Notes (Signed)
Pls cosign for B12 inj../lmb  

## 2021-01-19 ENCOUNTER — Ambulatory Visit (INDEPENDENT_AMBULATORY_CARE_PROVIDER_SITE_OTHER): Payer: Medicare Other | Admitting: Internal Medicine

## 2021-01-19 ENCOUNTER — Encounter: Payer: Self-pay | Admitting: Internal Medicine

## 2021-01-19 VITALS — BP 142/84 | HR 89 | Temp 98.4°F | Resp 16 | Ht 66.5 in | Wt 187.0 lb

## 2021-01-19 DIAGNOSIS — D518 Other vitamin B12 deficiency anemias: Secondary | ICD-10-CM | POA: Diagnosis not present

## 2021-01-19 DIAGNOSIS — I2510 Atherosclerotic heart disease of native coronary artery without angina pectoris: Secondary | ICD-10-CM | POA: Diagnosis not present

## 2021-01-19 DIAGNOSIS — E7800 Pure hypercholesterolemia, unspecified: Secondary | ICD-10-CM

## 2021-01-19 DIAGNOSIS — R739 Hyperglycemia, unspecified: Secondary | ICD-10-CM | POA: Insufficient documentation

## 2021-01-19 DIAGNOSIS — I10 Essential (primary) hypertension: Secondary | ICD-10-CM | POA: Diagnosis not present

## 2021-01-19 DIAGNOSIS — Z0000 Encounter for general adult medical examination without abnormal findings: Secondary | ICD-10-CM

## 2021-01-19 DIAGNOSIS — T50905A Adverse effect of unspecified drugs, medicaments and biological substances, initial encounter: Secondary | ICD-10-CM

## 2021-01-19 DIAGNOSIS — E871 Hypo-osmolality and hyponatremia: Secondary | ICD-10-CM | POA: Diagnosis not present

## 2021-01-19 NOTE — Progress Notes (Signed)
Subjective:  Patient ID: Samuel Fisher, male    DOB: [birth year]  Age: 75 y.o. MRN: [id number]  CC: Annual Exam, Hypertension, and Hyperlipidemia  This visit occurred during the SARS-CoV-2 public health emergency.  Safety protocols were in place, including screening questions prior to the visit, additional usage of staff PPE, and extensive cleaning of exam room while observing appropriate contact time as indicated for disinfecting solutions.    HPI Samuel Fisher presents for a CPX and f/up -   He walks several miles a day and does not experience CP, DOE, palpitations, diaphoresis, dizziness, lightheadedness, edema, or fatigue.  Outpatient Medications Prior to Visit  Medication Sig Dispense Refill   amoxicillin (AMOXIL) 500 MG capsule Take 2,000 mg by mouth once as needed (for dentist appointment).     aspirin 325 MG tablet Take 325 mg by mouth every 4 (four) hours as needed for moderate pain or headache.      Cyanocobalamin (VITAMIN B-12 IJ) Inject 1,000 mcg as directed every 30 (thirty) days.     fluticasone (FLONASE) 50 MCG/ACT nasal spray Place 1-2 sprays into both nostrils daily as needed. allergies  2   EDARBYCLOR 40-12.5 MG TABS TAKE 1 TABLET BY MOUTH DAILY. 30 tablet 2   cyanocobalamin ((VITAMIN B-12)) injection 1,000 mcg      No facility-administered medications prior to visit.    ROS Review of Systems  Constitutional:  Negative for diaphoresis, fatigue and unexpected weight change.  HENT: Negative.    Eyes: Negative.   Respiratory:  Negative for cough, chest tightness and wheezing.   Cardiovascular:  Negative for chest pain, palpitations and leg swelling.  Gastrointestinal:  Negative for abdominal pain, diarrhea, nausea and vomiting.  Endocrine: Negative.   Genitourinary: Negative.  Negative for difficulty urinating.  Musculoskeletal:  Negative for arthralgias and myalgias.  Skin: Negative.  Negative for color change and rash.  Neurological: Negative.  Negative  for dizziness, weakness and light-headedness.  Hematological:  Negative for adenopathy. Does not bruise/bleed easily.  Psychiatric/Behavioral: Negative.     Objective:  BP (!) 142/84 (BP Location: Left Arm, Patient Position: Sitting, Cuff Size: Large)   Pulse 89   Temp 98.4 F (36.9 C) (Oral)   Resp 16   Ht 5' 6.5" (1.689 m)   Wt 187 lb (84.8 kg)   SpO2 97%   BMI 29.73 kg/m   BP Readings from Last 3 Encounters:  01/19/21 (!) 142/84  10/19/20 130/80  04/06/20 (!) 150/90    Wt Readings from Last 3 Encounters:  01/19/21 187 lb (84.8 kg)  10/19/20 189 lb 9.6 oz (86 kg)  04/06/20 188 lb 2 oz (85.3 kg)    Physical Exam Vitals reviewed.  HENT:     Nose: Nose normal.     Mouth/Throat:     Mouth: Mucous membranes are moist.  Eyes:     Conjunctiva/sclera: Conjunctivae normal.  Cardiovascular:     Rate and Rhythm: Normal rate and regular rhythm.     Heart sounds: No murmur heard.    Comments: EKG- NSR, 81 bpm Normal EKG Pulmonary:     Effort: Pulmonary effort is normal.     Breath sounds: No stridor. No wheezing, rhonchi or rales.  Abdominal:     General: Abdomen is flat. Bowel sounds are normal. There is no distension.     Palpations: Abdomen is soft. There is no hepatomegaly, splenomegaly or mass.     Tenderness: There is no abdominal tenderness. There is no guarding.  Musculoskeletal:  Cervical back: Neck supple.     Right lower leg: No edema.     Left lower leg: No edema.  Skin:    General: Skin is warm and dry.     Coloration: Skin is not pale.  Neurological:     General: No focal deficit present.     Mental Status: He is alert.  Psychiatric:        Behavior: Behavior normal.    Lab Results  Component Value Date   WBC 5.0 01/20/2021   HGB 13.3 01/20/2021   HCT 38.5 (L) 01/20/2021   PLT 208.0 01/20/2021   GLUCOSE 119 (H) 01/20/2021   CHOL 159 01/20/2021   TRIG 70.0 01/20/2021   HDL 62.40 01/20/2021   LDLCALC 82 01/20/2021   ALT 19 01/20/2021    AST 25 01/20/2021   NA 128 (L) 01/20/2021   K 3.8 01/20/2021   CL 93 (L) 01/20/2021   CREATININE 1.06 01/20/2021   BUN 13 01/20/2021   CO2 25 01/20/2021   TSH 2.57 01/20/2021   PSA 8.44 12/29/2020   HGBA1C 5.7 01/20/2021    MR 3D Recon At Scanner  Result Date: 09/09/2020 CLINICAL DATA:  Follow-up pancreatic cystic lesions. EXAM: MRI ABDOMEN WITHOUT AND WITH CONTRAST (INCLUDING MRCP) TECHNIQUE: Multiplanar multisequence MR imaging of the abdomen was performed both before and after the administration of intravenous contrast. Heavily T2-weighted images of the biliary and pancreatic ducts were obtained, and three-dimensional MRCP images were rendered by post processing. CONTRAST:  40m GADAVIST GADOBUTROL 1 MMOL/ML IV SOLN COMPARISON:  MRI abdomen July 09, 2019 and January 29, 2020 FINDINGS: Lower chest: No acute abnormality. Hepatobiliary: No hepatic steatosis. No suspicious hepatic lesions. Gallbladder is unremarkable. No biliary ductal dilation. Pancreas: No pancreatic ductal dilation. No evidence of acute pancreatic inflammation. Overall no significant change in size of the multiple cystic pancreatic lesions, none of which demonstrate a solid nodular enhancing component or thickened septations. For reference: -Similar size of the multilobulated cystic lesion in the tail the pancreas which measures 1.9 x 1.6 cm on image 15/3 previously 2.0 x 1.5 cm. -Similar size of the multiloculated cystic lesion in the pancreatic head measuring 1.6 x 1.3 cm on image 24/3 previously 1.7 x 1.3 cm. -similar appearance of the numerous additional tiny cystic lesions throughout the pancreas measuring no greater than 3-4 mm for example in the pancreatic neck on image 26/3. Spleen:  Within normal limits in size and appearance. Adrenals/Urinary Tract: No masses identified. No evidence of hydronephrosis. Stomach/Bowel: Colonic diverticulosis without findings of acute diverticulitis. Otherwise the visualized portions within  the abdomen are unremarkable. Vascular/Lymphatic: No pathologically enlarged lymph nodes identified. Tortuous aorta without abdominal aortic aneurysm demonstrated. Other: Again seen is marked urinary bladder distension with trabeculation and small diverticula which is likely related to longstanding bladder outlet obstruction. Musculoskeletal: No suspicious bone lesions identified. IMPRESSION: 1. Overall no significant change in size or appearance of the multiple cystic pancreatic lesions, none of which demonstrate a solid nodular enhancing component or thickened septations. No pancreatic ductal dilation. These are favored to represent side branch IPMNs. Recommend follow up pre and post contrast MRI/MRCP or pancreatic protocol CT in 6 months. This recommendation follows ACR consensus guidelines: Management of Incidental Pancreatic Cysts: A White Paper of the ACR Incidental Findings Committee. [id number] 2. Distended urinary bladder similar in appearance to prior examination, correlate for urinary retention. Electronically Signed   By: JDahlia BailiffMD   On: 09/09/2020 09:01   MR ABDOMEN  MRCP W WO CONTAST  Result Date: 09/09/2020 CLINICAL DATA:  Follow-up pancreatic cystic lesions. EXAM: MRI ABDOMEN WITHOUT AND WITH CONTRAST (INCLUDING MRCP) TECHNIQUE: Multiplanar multisequence MR imaging of the abdomen was performed both before and after the administration of intravenous contrast. Heavily T2-weighted images of the biliary and pancreatic ducts were obtained, and three-dimensional MRCP images were rendered by post processing. CONTRAST:  47m GADAVIST GADOBUTROL 1 MMOL/ML IV SOLN COMPARISON:  MRI abdomen July 09, 2019 and January 29, 2020 FINDINGS: Lower chest: No acute abnormality. Hepatobiliary: No hepatic steatosis. No suspicious hepatic lesions. Gallbladder is unremarkable. No biliary ductal dilation. Pancreas: No pancreatic ductal dilation. No evidence of acute pancreatic inflammation.  Overall no significant change in size of the multiple cystic pancreatic lesions, none of which demonstrate a solid nodular enhancing component or thickened septations. For reference: -Similar size of the multilobulated cystic lesion in the tail the pancreas which measures 1.9 x 1.6 cm on image 15/3 previously 2.0 x 1.5 cm. -Similar size of the multiloculated cystic lesion in the pancreatic head measuring 1.6 x 1.3 cm on image 24/3 previously 1.7 x 1.3 cm. -similar appearance of the numerous additional tiny cystic lesions throughout the pancreas measuring no greater than 3-4 mm for example in the pancreatic neck on image 26/3. Spleen:  Within normal limits in size and appearance. Adrenals/Urinary Tract: No masses identified. No evidence of hydronephrosis. Stomach/Bowel: Colonic diverticulosis without findings of acute diverticulitis. Otherwise the visualized portions within the abdomen are unremarkable. Vascular/Lymphatic: No pathologically enlarged lymph nodes identified. Tortuous aorta without abdominal aortic aneurysm demonstrated. Other: Again seen is marked urinary bladder distension with trabeculation and small diverticula which is likely related to longstanding bladder outlet obstruction. Musculoskeletal: No suspicious bone lesions identified. IMPRESSION: 1. Overall no significant change in size or appearance of the multiple cystic pancreatic lesions, none of which demonstrate a solid nodular enhancing component or thickened septations. No pancreatic ductal dilation. These are favored to represent side branch IPMNs. Recommend follow up pre and post contrast MRI/MRCP or pancreatic protocol CT in 6 months. This recommendation follows ACR consensus guidelines: Management of Incidental Pancreatic Cysts: A White Paper of the ACR Incidental Findings Committee. [id number] 2. Distended urinary bladder similar in appearance to prior examination, correlate for urinary retention. Electronically  Signed   By: JDahlia BailiffMD   On: 09/09/2020 09:01    Assessment & Plan:   JAliuswas seen today for annual exam, hypertension and hyperlipidemia.  Diagnoses and all orders for this visit:  CAD in native artery- He has had no recent episodes of angina.  His EKG is reassuring. -     EKG 12-Lead -     Azilsartan Medoxomil (EDARBI) 80 MG TABS; Take 1 tablet (80 mg total) by mouth daily.  Essential hypertension, benign- His blood pressure is well controlled but his sodium level is down to 128.  I recommended that he stop taking the thiazide diuretic and increase the dose of the ARB. -     EKG 12-Lead -     CBC with Differential/Platelet; Future -     Basic metabolic panel; Future -     Urinalysis, Routine w reflex microscopic; Future -     TSH; Future -     TSH -     Urinalysis, Routine w reflex microscopic -     Basic metabolic panel -     CBC with Differential/Platelet -     Azilsartan Medoxomil (EDARBI) 80 MG TABS; Take 1 tablet (  80 mg total) by mouth daily.  Other vitamin B12 deficiency anemia- Will continue parenteral B12 replacement therapy. -     CBC with Differential/Platelet; Future -     Folate; Future -     Folate -     CBC with Differential/Platelet  Chronic hyponatremia- He is asymptomatic with this.  Will discontinue the thiazide diuretic.  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  Pure hypercholesterolemia- LDL goal achieved. Doing well on the statin  -     Lipid panel; Future -     Hepatic function panel; Future -     Hepatic function panel -     Lipid panel  Chronic hyperglycemia -     Basic metabolic panel; Future -     Hemoglobin A1c; Future -     Hemoglobin A1c -     Basic metabolic panel  I have discontinued Jaytin B. Bronk's Lexicographer. I am also having him start on Edarbi.  Additionally, I am having him maintain his aspirin, amoxicillin, fluticasone, and Cyanocobalamin (VITAMIN B-12 IJ). We will stop administering cyanocobalamin.  Meds ordered this encounter  Medications   Azilsartan Medoxomil (EDARBI) 80 MG TABS    Sig: Take 1 tablet (80 mg total) by mouth daily.    Dispense:  90 tablet    Refill:  1      Follow-up: Return in about 6 months (around 07/22/2021).  Scarlette Calico, MD

## 2021-01-19 NOTE — Progress Notes (Deleted)
Subjective:  Patient ID: Samuel Fisher, male    DOB: 09/14/1945  Age: 75 y.o. MRN: [id number]  CC: No chief complaint on file.   HPI Samuel Fisher presents for ***  Outpatient Medications Prior to Visit  Medication Sig Dispense Refill   amoxicillin (AMOXIL) 500 MG capsule Take 2,000 mg by mouth once as needed (for dentist appointment).     aspirin 325 MG tablet Take 325 mg by mouth every 4 (four) hours as needed for moderate pain or headache.      Cyanocobalamin (VITAMIN B-12 IJ) Inject 1,000 mcg as directed every 30 (thirty) days.     EDARBYCLOR 40-12.5 MG TABS TAKE 1 TABLET BY MOUTH DAILY. 30 tablet 2   fluticasone (FLONASE) 50 MCG/ACT nasal spray Place 1-2 sprays into both nostrils daily as needed. allergies  2   Facility-Administered Medications Prior to Visit  Medication Dose Route Frequency Provider Last Rate Last Admin   cyanocobalamin ((VITAMIN B-12)) injection 1,000 mcg  1,000 mcg Intramuscular Q30 days [name], MD   1,000 mcg at 01/18/21 1032    ROS Review of Systems  Objective:  There were no vitals taken for this visit.  BP Readings from Last 3 Encounters:  10/19/20 130/80  04/06/20 (!) 150/90  03/17/20 138/86    Wt Readings from Last 3 Encounters:  10/19/20 189 lb 9.6 oz (86 kg)  04/06/20 188 lb 2 oz (85.3 kg)  03/17/20 189 lb (85.7 kg)    Physical Exam  Lab Results  Component Value Date   WBC 6.4 03/17/2020   HGB 13.4 03/17/2020   HCT 38.8 03/17/2020   PLT 218 03/17/2020   GLUCOSE 115 (H) 05/28/2020   CHOL 136 11/05/2019   TRIG 47.0 11/05/2019   HDL 63.60 11/05/2019   LDLCALC 63 11/05/2019   ALT 18 11/05/2019   AST 22 11/05/2019   NA 129 (L) 05/28/2020   K 4.3 05/28/2020   CL 92 (L) 05/28/2020   CREATININE 1.03 05/28/2020   BUN 15 05/28/2020   CO2 28 05/28/2020   TSH 1.31 11/05/2019   PSA 6.99 01/02/2020    MR 3D Recon At Scanner  Result Date: 09/09/2020 CLINICAL DATA:  Follow-up pancreatic cystic lesions. EXAM: MRI  ABDOMEN WITHOUT AND WITH CONTRAST (INCLUDING MRCP) TECHNIQUE: Multiplanar multisequence MR imaging of the abdomen was performed both before and after the administration of intravenous contrast. Heavily T2-weighted images of the biliary and pancreatic ducts were obtained, and three-dimensional MRCP images were rendered by post processing. CONTRAST:  34m GADAVIST GADOBUTROL 1 MMOL/ML IV SOLN COMPARISON:  MRI abdomen July 09, 2019 and January 29, 2020 FINDINGS: Lower chest: No acute abnormality. Hepatobiliary: No hepatic steatosis. No suspicious hepatic lesions. Gallbladder is unremarkable. No biliary ductal dilation. Pancreas: No pancreatic ductal dilation. No evidence of acute pancreatic inflammation. Overall no significant change in size of the multiple cystic pancreatic lesions, none of which demonstrate a solid nodular enhancing component or thickened septations. For reference: -Similar size of the multilobulated cystic lesion in the tail the pancreas which measures 1.9 x 1.6 cm on image 15/3 previously 2.0 x 1.5 cm. -Similar size of the multiloculated cystic lesion in the pancreatic head measuring 1.6 x 1.3 cm on image 24/3 previously 1.7 x 1.3 cm. -similar appearance of the numerous additional tiny cystic lesions throughout the pancreas measuring no greater than 3-4 mm for example in the pancreatic neck on image 26/3. Spleen:  Within normal limits in size and appearance. Adrenals/Urinary Tract: No masses identified. No  evidence of hydronephrosis. Stomach/Bowel: Colonic diverticulosis without findings of acute diverticulitis. Otherwise the visualized portions within the abdomen are unremarkable. Vascular/Lymphatic: No pathologically enlarged lymph nodes identified. Tortuous aorta without abdominal aortic aneurysm demonstrated. Other: Again seen is marked urinary bladder distension with trabeculation and small diverticula which is likely related to longstanding bladder outlet obstruction. Musculoskeletal: No  suspicious bone lesions identified. IMPRESSION: 1. Overall no significant change in size or appearance of the multiple cystic pancreatic lesions, none of which demonstrate a solid nodular enhancing component or thickened septations. No pancreatic ductal dilation. These are favored to represent side branch IPMNs. Recommend follow up pre and post contrast MRI/MRCP or pancreatic protocol CT in 6 months. This recommendation follows ACR consensus guidelines: Management of Incidental Pancreatic Cysts: A White Paper of the ACR Incidental Findings Committee. [place] [id number]. 2. Distended urinary bladder similar in appearance to prior examination, correlate for urinary retention. Electronically Signed   By: Dahlia Bailiff MD   On: 09/09/2020 09:01   MR ABDOMEN MRCP W WO CONTAST  Result Date: 09/09/2020 CLINICAL DATA:  Follow-up pancreatic cystic lesions. EXAM: MRI ABDOMEN WITHOUT AND WITH CONTRAST (INCLUDING MRCP) TECHNIQUE: Multiplanar multisequence MR imaging of the abdomen was performed both before and after the administration of intravenous contrast. Heavily T2-weighted images of the biliary and pancreatic ducts were obtained, and three-dimensional MRCP images were rendered by post processing. CONTRAST:  38m GADAVIST GADOBUTROL 1 MMOL/ML IV SOLN COMPARISON:  MRI abdomen July 09, 2019 and January 29, 2020 FINDINGS: Lower chest: No acute abnormality. Hepatobiliary: No hepatic steatosis. No suspicious hepatic lesions. Gallbladder is unremarkable. No biliary ductal dilation. Pancreas: No pancreatic ductal dilation. No evidence of acute pancreatic inflammation. Overall no significant change in size of the multiple cystic pancreatic lesions, none of which demonstrate a solid nodular enhancing component or thickened septations. For reference: -Similar size of the multilobulated cystic lesion in the tail the pancreas which measures 1.9 x 1.6 cm on image 15/3 previously 2.0 x 1.5 cm. -Similar size of the  multiloculated cystic lesion in the pancreatic head measuring 1.6 x 1.3 cm on image 24/3 previously 1.7 x 1.3 cm. -similar appearance of the numerous additional tiny cystic lesions throughout the pancreas measuring no greater than 3-4 mm for example in the pancreatic neck on image 26/3. Spleen:  Within normal limits in size and appearance. Adrenals/Urinary Tract: No masses identified. No evidence of hydronephrosis. Stomach/Bowel: Colonic diverticulosis without findings of acute diverticulitis. Otherwise the visualized portions within the abdomen are unremarkable. Vascular/Lymphatic: No pathologically enlarged lymph nodes identified. Tortuous aorta without abdominal aortic aneurysm demonstrated. Other: Again seen is marked urinary bladder distension with trabeculation and small diverticula which is likely related to longstanding bladder outlet obstruction. Musculoskeletal: No suspicious bone lesions identified. IMPRESSION: 1. Overall no significant change in size or appearance of the multiple cystic pancreatic lesions, none of which demonstrate a solid nodular enhancing component or thickened septations. No pancreatic ductal dilation. These are favored to represent side branch IPMNs. Recommend follow up pre and post contrast MRI/MRCP or pancreatic protocol CT in 6 months. This recommendation follows ACR consensus guidelines: Management of Incidental Pancreatic Cysts: A White Paper of the ACR Incidental Findings Committee. [id number] 2. Distended urinary bladder similar in appearance to prior examination, correlate for urinary retention. Electronically Signed   By: JDahlia BailiffMD   On: 09/09/2020 09:01    Assessment & Plan:   There are no diagnoses linked to this encounter. I am having JJerisB.  Gallia maintain his aspirin, amoxicillin, fluticasone, Cyanocobalamin (VITAMIN B-12 IJ), and Edarbyclor. We will continue to administer cyanocobalamin.  No orders of the defined types were  placed in this encounter.    Follow-up: No follow-ups on file.  Scarlette Calico, MD

## 2021-01-19 NOTE — Patient Instructions (Signed)
Health Maintenance, Male Adopting a healthy lifestyle and getting preventive care are important in promoting health and wellness. Ask your health care provider about: The right schedule for you to have regular tests and exams. Things you can do on your own to prevent diseases and keep yourself healthy. What should I know about diet, weight, and exercise? Eat a healthy diet  Eat a diet that includes plenty of vegetables, fruits, low-fat dairy products, and lean protein. Do not eat a lot of foods that are high in solid fats, added sugars, or sodium.  Maintain a healthy weight Body mass index (BMI) is a measurement that can be used to identify possible weight problems. It estimates body fat based on height and weight. Your health care provider can help determine your BMI and help you achieve or maintain ahealthy weight. Get regular exercise Get regular exercise. This is one of the most important things you can do for your health. Most adults should: Exercise for at least 150 minutes each week. The exercise should increase your heart rate and make you sweat (moderate-intensity exercise). Do strengthening exercises at least twice a week. This is in addition to the moderate-intensity exercise. Spend less time sitting. Even light physical activity can be beneficial. Watch cholesterol and blood lipids Have your blood tested for lipids and cholesterol at 73 years of age, then havethis test every 5 years. You may need to have your cholesterol levels checked more often if: Your lipid or cholesterol levels are high. You are older than 73 years of age. You are at high risk for heart disease. What should I know about cancer screening? Many types of cancers can be detected early and may often be prevented. Depending on your health history and family history, you may need to have cancer screening at various ages. This may include screening for: Colorectal cancer. Prostate cancer. Skin cancer. Lung  cancer. What should I know about heart disease, diabetes, and high blood pressure? Blood pressure and heart disease High blood pressure causes heart disease and increases the risk of stroke. This is more likely to develop in people who have high blood pressure readings, are of African descent, or are overweight. Talk with your health care provider about your target blood pressure readings. Have your blood pressure checked: Every 3-5 years if you are 18-39 years of age. Every year if you are 40 years old or older. If you are between the ages of 65 and 75 and are a current or former smoker, ask your health care provider if you should have a one-time screening for abdominal aortic aneurysm (AAA). Diabetes Have regular diabetes screenings. This checks your fasting blood sugar level. Have the screening done: Once every three years after age 45 if you are at a normal weight and have a low risk for diabetes. More often and at a younger age if you are overweight or have a high risk for diabetes. What should I know about preventing infection? Hepatitis B If you have a higher risk for hepatitis B, you should be screened for this virus. Talk with your health care provider to find out if you are at risk forhepatitis B infection. Hepatitis C Blood testing is recommended for: Everyone born from 1945 through 1965. Anyone with known risk factors for hepatitis C. Sexually transmitted infections (STIs) You should be screened each year for STIs, including gonorrhea and chlamydia, if: You are sexually active and are younger than 73 years of age. You are older than 73 years of age   and your health care provider tells you that you are at risk for this type of infection. Your sexual activity has changed since you were last screened, and you are at increased risk for chlamydia or gonorrhea. Ask your health care provider if you are at risk. Ask your health care provider about whether you are at high risk for HIV.  Your health care provider may recommend a prescription medicine to help prevent HIV infection. If you choose to take medicine to prevent HIV, you should first get tested for HIV. You should then be tested every 3 months for as long as you are taking the medicine. Follow these instructions at home: Lifestyle Do not use any products that contain nicotine or tobacco, such as cigarettes, e-cigarettes, and chewing tobacco. If you need help quitting, ask your health care provider. Do not use street drugs. Do not share needles. Ask your health care provider for help if you need support or information about quitting drugs. Alcohol use Do not drink alcohol if your health care provider tells you not to drink. If you drink alcohol: Limit how much you have to 0-2 drinks a day. Be aware of how much alcohol is in your drink. In the [place] one drink equals one 12 oz bottle of beer (355 mL), one 5 oz glass of wine (148 mL), or one 1 oz glass of hard liquor (44 mL). General instructions Schedule regular health, dental, and eye exams. Stay current with your vaccines. Tell your health care provider if: You often feel depressed. You have ever been abused or do not feel safe at home. Summary Adopting a healthy lifestyle and getting preventive care are important in promoting health and wellness. Follow your health care provider's instructions about healthy diet, exercising, and getting tested or screened for diseases. Follow your health care provider's instructions on monitoring your cholesterol and blood pressure. This information is not intended to replace advice given to you by your health care provider. Make sure you discuss any questions you have with your healthcare provider. Document Revised: 05/30/2018 Document Reviewed: 05/30/2018 Elsevier Patient Education  2022 Elsevier Inc.  

## 2021-01-20 LAB — LIPID PANEL
Cholesterol: 159 mg/dL (ref 0–200)
HDL: 62.4 mg/dL (ref >39.00)
LDL Cholesterol: 82 mg/dL (ref 0–99)
NonHDL: 96.2
Total CHOL/HDL Ratio: 3
Triglycerides: 70 mg/dL (ref 0.0–149.0)
VLDL: 14 mg/dL (ref 0.0–40.0)

## 2021-01-20 LAB — HEPATIC FUNCTION PANEL
ALT: 19 U/L (ref 0–53)
AST: 25 U/L (ref 0–37)
Albumin: 4.5 g/dL (ref 3.5–5.2)
Alkaline Phosphatase: 45 U/L (ref 39–117)
Bilirubin, Direct: 0.2 mg/dL (ref 0.0–0.3)
Total Bilirubin: 0.9 mg/dL (ref 0.2–1.2)
Total Protein: 7.1 g/dL (ref 6.0–8.3)

## 2021-01-20 LAB — CBC WITH DIFFERENTIAL/PLATELET
Basophils Absolute: 0 10*3/uL (ref 0.0–0.1)
Basophils Relative: 0.6 % (ref 0.0–3.0)
Eosinophils Absolute: 0.3 10*3/uL (ref 0.0–0.7)
Eosinophils Relative: 5.4 % — ABNORMAL HIGH (ref 0.0–5.0)
HCT: 38.5 % — ABNORMAL LOW (ref 39.0–52.0)
Hemoglobin: 13.3 g/dL (ref 13.0–17.0)
Lymphocytes Relative: 27.7 % (ref 12.0–46.0)
Lymphs Abs: 1.4 10*3/uL (ref 0.7–4.0)
MCHC: 34.6 g/dL (ref 30.0–36.0)
MCV: 96.3 fl (ref 78.0–100.0)
Monocytes Absolute: 0.6 10*3/uL (ref 0.1–1.0)
Monocytes Relative: 11.3 % (ref 3.0–12.0)
Neutro Abs: 2.7 10*3/uL (ref 1.4–7.7)
Neutrophils Relative %: 55 % (ref 43.0–77.0)
Platelets: 208 10*3/uL (ref 150.0–400.0)
RBC: 4 Mil/uL — ABNORMAL LOW (ref 4.22–5.81)
RDW: 12.8 % (ref 11.5–15.5)
WBC: 5 10*3/uL (ref 4.0–10.5)

## 2021-01-20 LAB — URINALYSIS, ROUTINE W REFLEX MICROSCOPIC
Bilirubin Urine: NEGATIVE
Hgb urine dipstick: NEGATIVE
Ketones, ur: NEGATIVE
Leukocytes,Ua: NEGATIVE
Nitrite: NEGATIVE
RBC / HPF: NONE SEEN (ref 0–?)
Specific Gravity, Urine: 1.01 (ref 1.000–1.030)
Total Protein, Urine: NEGATIVE
Urine Glucose: NEGATIVE
Urobilinogen, UA: 0.2 (ref 0.0–1.0)
WBC, UA: NONE SEEN (ref 0–?)
pH: 6 (ref 5.0–8.0)

## 2021-01-20 LAB — BASIC METABOLIC PANEL
BUN: 13 mg/dL (ref 6–23)
CO2: 25 mEq/L (ref 19–32)
Calcium: 9.6 mg/dL (ref 8.4–10.5)
Chloride: 93 mEq/L — ABNORMAL LOW (ref 96–112)
Creatinine, Ser: 1.06 mg/dL (ref 0.40–1.50)
GFR: 69.69 mL/min (ref >60.00)
Glucose, Bld: 119 mg/dL — ABNORMAL HIGH (ref 70–99)
Potassium: 3.8 mEq/L (ref 3.5–5.1)
Sodium: 128 mEq/L — ABNORMAL LOW (ref 135–145)

## 2021-01-20 LAB — HEMOGLOBIN A1C: Hgb A1c MFr Bld: 5.7 % (ref 4.6–6.5)

## 2021-01-20 LAB — FOLATE: Folate: 9.3 ng/mL (ref >5.9)

## 2021-01-20 LAB — TSH: TSH: 2.57 u[IU]/mL (ref 0.35–5.50)

## 2021-01-20 MED ORDER — EDARBI 80 MG PO TABS: 1.0000 | ORAL_TABLET | Freq: Every day | ORAL | 1 refills | Status: DC

## 2021-03-01 NOTE — Progress Notes (Signed)
Appointment Information  Name: Mordecai, Oo MRN: [id number]  Date: 03/08/2021 Status: Sch  Time: 4:00 PM Length: 60  Visit Type: MR ABD W/WO CM/MRCP [id number] Copay: $0.00  Provider: [facility] Department: [facility]  Referring Provider: Thornton Park CSN: [id number]  Notes: S/w pt arrive @ 330pm ,NPO 4hrs prior...[place]   Made On: 02/24/2021 1:42 PM By: Maryann Conners   Appointment Information  Name: Rollan, Elsenpeter MRN: [id number]  Date: 03/12/2021 Status: Sch  Time: 2:10 PM Length: 20  Visit Type: FOLLOW UP 20 [336] Copay: $25.00  Provider: Thornton Park, MD Department: [facility]  Referring Provider: [name] CSN: [id number]  Notes: F/u MRI/ae  Made On: 03/01/2021 6:43 AM By: Merlene Pulling

## 2021-03-08 ENCOUNTER — Other Ambulatory Visit: Payer: Self-pay

## 2021-03-08 ENCOUNTER — Ambulatory Visit (HOSPITAL_COMMUNITY)
Admission: RE | Admit: 2021-03-08 | Discharge: 2021-03-08 | Disposition: A | Discharge status: Home / Self Care | Payer: Medicare Other | Source: Ambulatory Visit | Attending: Gastroenterology | Admitting: Gastroenterology

## 2021-03-08 ENCOUNTER — Other Ambulatory Visit: Payer: Self-pay | Admitting: Gastroenterology

## 2021-03-08 DIAGNOSIS — K862 Cyst of pancreas: Secondary | ICD-10-CM | POA: Insufficient documentation

## 2021-03-08 DIAGNOSIS — R935 Abnormal findings on diagnostic imaging of other abdominal regions, including retroperitoneum: Secondary | ICD-10-CM | POA: Diagnosis not present

## 2021-03-08 DIAGNOSIS — N3289 Other specified disorders of bladder: Secondary | ICD-10-CM | POA: Diagnosis not present

## 2021-03-08 DIAGNOSIS — N1330 Unspecified hydronephrosis: Secondary | ICD-10-CM | POA: Diagnosis not present

## 2021-03-08 MED ORDER — GADOBUTROL 1 MMOL/ML IV SOLN
8.5000 mL | Freq: Once | INTRAVENOUS | Status: AC | PRN
  Administered 2021-03-08: 8.5 mL via INTRAVENOUS

## 2021-03-12 ENCOUNTER — Ambulatory Visit: Payer: Medicare Other | Admitting: Gastroenterology

## 2021-03-12 ENCOUNTER — Encounter: Payer: Self-pay | Admitting: Gastroenterology

## 2021-03-12 VITALS — BP 140/90 | HR 90 | Ht 68.0 in | Wt 189.0 lb

## 2021-03-12 DIAGNOSIS — K862 Cyst of pancreas: Secondary | ICD-10-CM | POA: Diagnosis not present

## 2021-03-12 NOTE — Progress Notes (Signed)
Referring Provider: [name], MD Primary Care Physician:  [name], MD  Chief complaint:  Abnormal pancreas   IMPRESSION:  Multiloculated cystic lesion in the pancreatic head with normal Ca-19-9. Stable MRI/MRCP compared to 08/2020. He has no ongoing GI symptoms.  Follow-up MRI is reassuring. Plan repeat MRI/MRCP in 1-2 years, earlier with new symptoms.     PLAN: - MRI/MRCP in September 2024, earlier with new symptoms  - Follow-up in September 2024 following the MRI  This plan was created on discussion with Dr. Rush Landmark, one of gastroenterologists who performs EUS  Please see the "Patient Instructions" section for addition details about the plan.  HPI: Samuel Fisher is a 75 y.o. male who returns in follow-up for an abnormal pancreas on MRI. The history is obtained through the patient and review of his electronic health record.  CT of the chest with contrast 06/05/2019 suggested a cystic lesion in the pancreatic tail.  An MRI was obtained for additional evaluation 07/09/2019. Follow-up MRI with and without contrast 01/29/2020 redemonstrated a multilobulated cystic lesion of the pancreatic tail measuring 2.0 x 1.5 cm. This was unchanged compared to the MRI from January. However, a multiloculated cystic lesion or adjacent lesions in the pancreatic head measuring approximately 1.7 x 1.3 cm have enlarged compared to the prior examination when they were 1.3 x 0.8 cm. There is no definitive communication with the pancreatic duct. No pancreatic ductal dilatation.  Labs 11/05/2019 show a normal comprehensive metabolic panel including normal liver enzymes. Hemoglobin was 12.1 with an MCV of 96.8 and an RDW of 12.9.  Ca-19-9 normal at 7 04/10/20  MRI with and without MRCP 09/08/2020 showed no significant change in the size or the appearance of multiple cystic pancreatic lesions noted which demonstrate no enhancing component or thickened septation.  No pancreatic ductal dilatation.   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  MRI/MRCP 03/08/21:  1. There are multiple small fluid signal cystic lesions throughout the pancreatic parenchyma, the largest single lesion in the pancreatic tail measuring 1.3 x 1.2 cm, multiple additional subcentimeter lesions present in the pancreatic head and tail. These findings are most consistent with small side branch intraductal papillary mucinous neoplasms or pseudocysts. As there is no observed increased risk of malignancy for such lesions smaller than 2 cm, no further follow-up or characterization is required. 2. Severely distended urinary bladder, which extends well into the abdomen, with mild bilateral hydronephrosis and hydroureter, this appearance similar to prior examination.  There is no weight loss, anorexia, nausea, abdominal pain, jaundice, back pain, change in bowel habits. There is no family history of pancreatic cancer.   The patient reports having 2 normal screening colonoscopies with Dr. Earlean Shawl in the past.  Negative Cologuard earlier this year.     Past Medical History:  Diagnosis Date   Allergy    Arthritis    BPH (benign prostatic hyperplasia) 2010   Brain tumor ([place])    Hearing problem    Hyperlipidemia    Hypertension    Inner ear dysfunction     Past Surgical History:  Procedure Laterality Date   BRAIN TUMOR EXCISION     NERVE GRAFT     TONSILLECTOMY  1951   TOTAL HIP ARTHROPLASTY     right-2002 and left-2004, 2010    Current Outpatient Medications  Medication Sig Dispense Refill   amoxicillin (AMOXIL) 500 MG capsule Take 2,000 mg by mouth once as needed (for dentist appointment).     aspirin 325 MG tablet Take 325 mg  by mouth every 4 (four) hours as needed for moderate pain or headache.      Azilsartan Medoxomil (EDARBI) 80 MG TABS Take 1 tablet (80 mg total) by mouth daily. 90 tablet 1   Cyanocobalamin (VITAMIN B-12 IJ) Inject 1,000 mcg as directed every 30 (thirty) days.     fluticasone (FLONASE) 50  MCG/ACT nasal spray Place 1-2 sprays into both nostrils daily as needed. allergies  2   No current facility-administered medications for this visit.    Allergies as of 03/12/2021 - Review Complete 03/12/2021  Allergen Reaction Noted   Bee venom Anaphylaxis 12/30/2015    Family History  Problem Relation Age of Onset   Uterine cancer Mother    Rheum arthritis Mother    Hypertension Father    Throat cancer Father        smoker   Arthritis Other    Heart disease Other    Hypertension Other    Stroke Other    Early death Other    Mental illness Other    Heart attack Brother        smoker   Heart attack Maternal Grandfather    Hypertension Brother    Stroke Brother    Diabetes Neg Hx    Hyperlipidemia Neg Hx       Physical Exam: General:   Alert,  well-nourished, pleasant and cooperative in NAD Head:  Normocephalic and atraumatic. Eyes:  Sclera clear, no icterus.   Conjunctiva pink. Abdomen:  Soft, nontender, nondistended, normal bowel sounds, no rebound or guarding. No hepatosplenomegaly.  No inguinal or umbilical lymphadenopathy. Neurologic:  Alert and  oriented x4;  grossly nonfocal Skin:  Intact without significant lesions or rashes. Psych:  Alert and cooperative. Normal mood and affect.     Samuel Fisher L. Tarri Glenn, MD, MPH 03/12/2021, 2:23 PM

## 2021-03-12 NOTE — Patient Instructions (Addendum)
It was my pleasure to provide care to you today. Based on our discussion, I am providing you with my recommendations below:  RECOMMENDATION(S):   I have not made any changes to your regimen. I would like to see you back in 2 years  FOLLOW UP:  Please call the office at [phone] to schedule your follow up appointment in years  BMI:  If you are age 73 or older, your body mass index should be between 23-30. Your Body mass index is 28.74 kg/m. If this is out of the aforementioned range listed, please consider follow up with your Primary Care Provider.  MY CHART:  The [facility] providers would like to encourage you to use [facility] to communicate with providers for non-urgent requests or questions.  Due to long hold times on the telephone, sending your provider a message by [facility] may be a faster and more efficient way to get a response.  Please allow 48 business hours for a response.  Please remember that this is for non-urgent requests.   Thank you for trusting me with your gastrointestinal care!    Thornton Park, MD, MPH

## 2021-03-17 DIAGNOSIS — H2513 Age-related nuclear cataract, bilateral: Secondary | ICD-10-CM | POA: Diagnosis not present

## 2021-03-17 DIAGNOSIS — G510 Bell's palsy: Secondary | ICD-10-CM | POA: Diagnosis not present

## 2021-04-22 ENCOUNTER — Other Ambulatory Visit: Payer: Self-pay | Admitting: Internal Medicine

## 2021-04-22 DIAGNOSIS — E785 Hyperlipidemia, unspecified: Secondary | ICD-10-CM

## 2021-04-22 DIAGNOSIS — I2510 Atherosclerotic heart disease of native coronary artery without angina pectoris: Secondary | ICD-10-CM

## 2021-04-22 MED ORDER — ROSUVASTATIN CALCIUM 5 MG PO TABS: 5.0000 mg | ORAL_TABLET | Freq: Every day | ORAL | 1 refills | Status: DC

## 2021-05-10 ENCOUNTER — Ambulatory Visit (INDEPENDENT_AMBULATORY_CARE_PROVIDER_SITE_OTHER): Payer: Medicare Other

## 2021-05-10 ENCOUNTER — Other Ambulatory Visit: Payer: Self-pay

## 2021-05-10 DIAGNOSIS — D518 Other vitamin B12 deficiency anemias: Secondary | ICD-10-CM | POA: Diagnosis not present

## 2021-05-10 DIAGNOSIS — Z23 Encounter for immunization: Secondary | ICD-10-CM

## 2021-05-10 MED ORDER — CYANOCOBALAMIN 1000 MCG/ML IJ SOLN
1000.0000 ug | Freq: Once | INTRAMUSCULAR | Status: AC
  Administered 2021-05-10: 1000 ug via INTRAMUSCULAR

## 2021-05-10 NOTE — Progress Notes (Signed)
Pt given Q33 w/o any complications.  Pt given High Dose flu vacc w/o any complications.

## 2021-05-17 ENCOUNTER — Telehealth: Payer: Self-pay | Admitting: Gastroenterology

## 2021-05-18 NOTE — Telephone Encounter (Signed)
Called and spoke with pt, he reports that he has already been evaluated by an Urgent Care. Pt will call us back if he has any other concerns.

## 2021-05-19 ENCOUNTER — Telehealth (HOSPITAL_COMMUNITY): Payer: Self-pay | Admitting: Family Medicine

## 2021-05-19 ENCOUNTER — Other Ambulatory Visit: Payer: Self-pay

## 2021-05-19 ENCOUNTER — Encounter (HOSPITAL_COMMUNITY): Payer: Self-pay

## 2021-05-19 ENCOUNTER — Ambulatory Visit (HOSPITAL_COMMUNITY)
Admission: RE | Admit: 2021-05-19 | Discharge: 2021-05-19 | Disposition: A | Discharge status: Home / Self Care | Payer: Medicare Other | Source: Ambulatory Visit | Attending: Family Medicine | Admitting: Family Medicine

## 2021-05-19 VITALS — BP 195/93 | HR 78 | Temp 98.2°F | Resp 18

## 2021-05-19 DIAGNOSIS — R197 Diarrhea, unspecified: Secondary | ICD-10-CM | POA: Diagnosis not present

## 2021-05-19 DIAGNOSIS — D518 Other vitamin B12 deficiency anemias: Secondary | ICD-10-CM | POA: Insufficient documentation

## 2021-05-19 LAB — CBC
HCT: 27.6 % — ABNORMAL LOW (ref 39.0–52.0)
Hemoglobin: 9.3 g/dL — ABNORMAL LOW (ref 13.0–17.0)
MCH: 32.9 pg (ref 26.0–34.0)
MCHC: 33.7 g/dL (ref 30.0–36.0)
MCV: 97.5 fL (ref 80.0–100.0)
Platelets: 198 10*3/uL (ref 150–400)
RBC: 2.83 MIL/uL — ABNORMAL LOW (ref 4.22–5.81)
RDW: 12.6 % (ref 11.5–15.5)
WBC: 6.1 10*3/uL (ref 4.0–10.5)
nRBC: 0 % (ref 0.0–0.2)

## 2021-05-19 NOTE — ED Triage Notes (Signed)
Friday night went to oyster roast. Hour after party started having diarrhea with blood in it.  Saturday pt took imodium and Pepto bismol most of day with Pedialyte.  Monday wife wanted him to see doctor but Dr Ronnald Ramp out of town, gastro MD both out of town and couldn't get in.  Reports that has felt fine and no more diarrhea or blood since Saturday.

## 2021-05-19 NOTE — ED Provider Notes (Signed)
[facility]    CSN: [id number] Arrival date & time: 05/19/21  1643      History   Chief Complaint Chief Complaint  Patient presents with   appt 1700    HPI Samuel Fisher is a 75 y.o. male.   HPI Patient reports 5 days ago  he developed bloody diarrhea which he describes as " bright red stool"  following eating oysters and drinking alcohol. He experienced a few more stool which were bloody Friday night and one bloody stool the following day. He rested his GI by placed himself on a liquid diet and taken over-the-counter Pepto-Bismol out and reports no additional stools.  The subsequent day he resumed his regular diet and has not had any additional bloody stools.  At no point did he experience any abdominal pain, dizziness, generalized weakness, or nausea or vomiting.  Patient has no history of documented diverticular disease however is followed by GI for evaluation of a pancreatic mass.  He has had previous colonoscopies which were negative.  Patient however does suffer from B12 and iron deficiency and according to medical record is prescribed Endari for management of anemia.   Unrelated patient is having left ankle swelling.  He reports during Thanksgiving holiday twisting his ankle and subsequently the ankle has began to swell. He denies any pain.  Declines imaging which was offered today.  Patient is able to ambulate and bear weight without any complications.   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 03/18/2020   CAD in native artery 06/10/2019   Cystic mass of pancreas 06/06/2019   Chronic hyponatremia 05/28/2019   Vitamin D deficiency  01/15/2018   B12 deficiency anemia 07/15/2016   Benign schwannoma 07/14/2016   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   Osteoarthritis of hip 01/28/2013   Essential hypertension, benign 06/28/2012   Pure hypercholesterolemia 06/28/2012   BPH (benign prostatic hyperplasia) 06/28/2012   Routine general medical examination at a health care facility 06/28/2012   PSA elevation 06/28/2012    Past Surgical History:  Procedure Laterality Date   BRAIN TUMOR [place]     right-2002 and left-2004, 2010       Home Medications    Prior to Admission medications   Medication Sig Start Date End Date Taking? Authorizing Provider  amoxicillin (AMOXIL) 500 MG capsule Take 2,000 mg by mouth once as needed (for dentist appointment).    [provider]  aspirin 325 MG tablet Take 325 mg by mouth every 4 (four) hours as needed for moderate pain or headache.     [provider]  Azilsartan Medoxomil (EDARBI) 80 MG TABS Take 1 tablet (80 mg total) by mouth daily. 01/20/21   [name], MD  Cyanocobalamin (VITAMIN B-12 IJ) Inject 1,000 mcg as directed every 30 (thirty) days.    [provider]  fluticasone (FLONASE) 50 MCG/ACT nasal spray Place 1-2 sprays into both nostrils daily as needed. allergies 12/03/15   [provider]  rosuvastatin (CRESTOR) 5  MG tablet Take 1 tablet (5 mg total) by mouth daily. 04/22/21   [name], MD    Family History Family History  Problem Relation Age of Onset   Uterine cancer Mother    Rheum arthritis Mother    Hypertension Father    Throat cancer Father        smoker   Arthritis Other    Heart disease Other    Hypertension Other    Stroke Other    Early death Other    Mental illness Other    Heart attack Brother        smoker   Heart attack Maternal Grandfather    Hypertension Brother    Stroke Brother    Diabetes Neg Hx    Hyperlipidemia Neg Hx      Social History Social History   Tobacco Use   Smoking status: Former    Types: Cigarettes    Quit date: 06/28/1976    Years since quitting: 44.9   Smokeless tobacco: Never  Vaping Use   Vaping Use: Never used  Substance Use Topics   Alcohol use: Yes    Alcohol/week: 3.0 standard drinks    Types: 3 Cans of beer per week    Comment: 2-3 beers per day and occasional drink on the weekends   Drug use: No     Allergies   Bee venom   Review of Systems Review of Systems Pertinent negatives listed in HPI   Physical Exam Triage Vital Signs ED Triage Vitals  Enc Vitals Group     BP 05/19/21 1707 (!) 201/110     Pulse Rate 05/19/21 1707 78     Resp 05/19/21 1707 18     Temp 05/19/21 1709 98.2 F (36.8 C)     Temp Source 05/19/21 1707 Oral     SpO2 05/19/21 1707 100 %     Weight --      Height --      Head Circumference --      Peak Flow --      Pain Score 05/19/21 1704 0     Pain Loc --      Pain Edu? --      Excl. in [place]? --    No data found.  Updated Vital Signs BP (!) 195/93 (BP Location: Right Arm)   Pulse 78   Temp 98.2 F (36.8 C) (Oral)   Resp 18   SpO2 100%   Visual Acuity Right Eye Distance:   Left Eye Distance:   Bilateral Distance:    Right Eye Near:   Left Eye Near:    Bilateral Near:     Physical Exam HENT:     Head: Normocephalic and atraumatic.  Cardiovascular:     Rate and Rhythm: Normal rate and regular rhythm.  Pulmonary:     Effort: Pulmonary effort is normal.     Breath sounds: Normal breath sounds and air entry.  Abdominal:     General: Abdomen is protuberant. Bowel sounds are normal. There is no distension.     Tenderness: There is no abdominal tenderness.  Musculoskeletal:     Left ankle: Swelling present. No tenderness. Normal range of motion.  Skin:    Capillary Refill: Capillary refill takes less than 2 seconds.  Neurological:     General: No focal deficit present.     Mental Status: He is alert and oriented to  person, place, and time.  Psychiatric:  Mood and Affect: Mood normal.        Behavior: Behavior normal.        Thought Content: Thought content normal.        Judgment: Judgment normal.     UC Treatments / Results  Labs (all labs ordered are listed, but only abnormal results are displayed) Labs Reviewed  CBC - Abnormal; Notable for the following components:      Result Value   RBC 2.83 (*)    Hemoglobin 9.3 (*)    HCT 27.6 (*)    All other components within normal limits    EKG   Radiology No results found.  Procedures Procedures (including critical care time)  Medications Ordered in UC Medications - No data to display  Initial Impression / Assessment and Plan / UC Course  I have reviewed the triage vital signs and the nursing notes.  Pertinent labs & imaging results that were available during my care of the patient were reviewed by me and considered in my medical decision making (see chart for details).    1 day of bloody diarrhea x5 days ago.  We will check a CBC given patient's history of B12 deficiency anemia. Will notify patient by phone of any abnormal lab results.  Advised to follow-up with primary care doctor or GI doctor to discuss any additional work-up which may be needed.  Unfortunately given the limitations of urgent care were unable to order any advanced imaging.  Patient appears overall stable and is without any distressful symptoms which would be concerning for any type of symptomatic anemia.  Patient advised to go immediately to the ER if he sees any additional bloody stool, develops any severe abdominal pain or any generalized weakness.  Patient verbalized understanding and agreement with plan. Final Clinical Impressions(s) / UC Diagnoses   Final diagnoses:  Bloody diarrhea  Other vitamin B12 deficiency anemia     Discharge Instructions      I will notify you if any of your blood work returns as abnormal.  I am checking your hemoglobin and  your blood count just to ensure that the episode of bloody diarrhea did not lower your blood count.  Given bloody diarrhea abruptly resolved with minimal interventions do not suspect a GI bleed however do recommend follow-up with your primary care doctor or gastroenterologist to discuss if  any additional evaluation is needed or recommended.     ED Prescriptions   None    PDMP not reviewed this encounter.   Scot Jun, [place] 05/20/21 [phone]

## 2021-05-19 NOTE — Telephone Encounter (Signed)
Left voicemail for patient to call back regarding his visit . Patient had an abnormal hemoglobin level needs to follow-up with his GI doctor tomorrow or follow-up at the ER.

## 2021-05-19 NOTE — Discharge Instructions (Signed)
I will notify you if any of your blood work returns as abnormal.  I am checking your hemoglobin and your blood count just to ensure that the episode of bloody diarrhea did not lower your blood count.  Given bloody diarrhea abruptly resolved with minimal interventions do not suspect a GI bleed however do recommend follow-up with your primary care doctor or gastroenterologist to discuss if  any additional evaluation is needed or recommended.

## 2021-06-09 ENCOUNTER — Other Ambulatory Visit: Payer: Self-pay

## 2021-06-09 ENCOUNTER — Inpatient Hospital Stay (HOSPITAL_COMMUNITY)
Admission: EM | Admit: 2021-06-09 | Discharge: 2021-06-12 | DRG: 683 | Disposition: A | Discharge status: Home / Self Care | Payer: Medicare Other | Attending: Family Medicine | Admitting: Family Medicine

## 2021-06-09 ENCOUNTER — Ambulatory Visit (INDEPENDENT_AMBULATORY_CARE_PROVIDER_SITE_OTHER): Payer: Medicare Other

## 2021-06-09 DIAGNOSIS — F10929 Alcohol use, unspecified with intoxication, unspecified: Secondary | ICD-10-CM | POA: Diagnosis not present

## 2021-06-09 DIAGNOSIS — W19XXXA Unspecified fall, initial encounter: Secondary | ICD-10-CM | POA: Diagnosis present

## 2021-06-09 DIAGNOSIS — S0003XA Contusion of scalp, initial encounter: Secondary | ICD-10-CM | POA: Diagnosis not present

## 2021-06-09 DIAGNOSIS — N400 Enlarged prostate without lower urinary tract symptoms: Secondary | ICD-10-CM | POA: Diagnosis not present

## 2021-06-09 DIAGNOSIS — N1330 Unspecified hydronephrosis: Secondary | ICD-10-CM | POA: Diagnosis not present

## 2021-06-09 DIAGNOSIS — R310 Gross hematuria: Secondary | ICD-10-CM | POA: Diagnosis present

## 2021-06-09 DIAGNOSIS — E7800 Pure hypercholesterolemia, unspecified: Secondary | ICD-10-CM | POA: Diagnosis present

## 2021-06-09 DIAGNOSIS — Z96643 Presence of artificial hip joint, bilateral: Secondary | ICD-10-CM | POA: Diagnosis present

## 2021-06-09 DIAGNOSIS — Y908 Blood alcohol level of 240 mg/100 ml or more: Secondary | ICD-10-CM | POA: Diagnosis present

## 2021-06-09 DIAGNOSIS — D518 Other vitamin B12 deficiency anemias: Secondary | ICD-10-CM

## 2021-06-09 DIAGNOSIS — Z20822 Contact with and (suspected) exposure to covid-19: Secondary | ICD-10-CM | POA: Diagnosis not present

## 2021-06-09 DIAGNOSIS — I82452 Acute embolism and thrombosis of left peroneal vein: Secondary | ICD-10-CM | POA: Diagnosis present

## 2021-06-09 DIAGNOSIS — F10920 Alcohol use, unspecified with intoxication, uncomplicated: Secondary | ICD-10-CM

## 2021-06-09 DIAGNOSIS — R609 Edema, unspecified: Secondary | ICD-10-CM | POA: Diagnosis not present

## 2021-06-09 DIAGNOSIS — Z8249 Family history of ischemic heart disease and other diseases of the circulatory system: Secondary | ICD-10-CM | POA: Diagnosis not present

## 2021-06-09 DIAGNOSIS — Z23 Encounter for immunization: Secondary | ICD-10-CM

## 2021-06-09 DIAGNOSIS — Z87891 Personal history of nicotine dependence: Secondary | ICD-10-CM

## 2021-06-09 DIAGNOSIS — Y92009 Unspecified place in unspecified non-institutional (private) residence as the place of occurrence of the external cause: Secondary | ICD-10-CM | POA: Diagnosis not present

## 2021-06-09 DIAGNOSIS — N3289 Other specified disorders of bladder: Secondary | ICD-10-CM | POA: Diagnosis not present

## 2021-06-09 DIAGNOSIS — Z7982 Long term (current) use of aspirin: Secondary | ICD-10-CM

## 2021-06-09 DIAGNOSIS — I10 Essential (primary) hypertension: Secondary | ICD-10-CM | POA: Diagnosis not present

## 2021-06-09 DIAGNOSIS — Z91030 Bee allergy status: Secondary | ICD-10-CM

## 2021-06-09 DIAGNOSIS — F10120 Alcohol abuse with intoxication, uncomplicated: Secondary | ICD-10-CM | POA: Diagnosis present

## 2021-06-09 DIAGNOSIS — S0101XA Laceration without foreign body of scalp, initial encounter: Secondary | ICD-10-CM | POA: Diagnosis not present

## 2021-06-09 DIAGNOSIS — R338 Other retention of urine: Secondary | ICD-10-CM

## 2021-06-09 DIAGNOSIS — Z79899 Other long term (current) drug therapy: Secondary | ICD-10-CM | POA: Diagnosis not present

## 2021-06-09 DIAGNOSIS — Z83438 Family history of other disorder of lipoprotein metabolism and other lipidemia: Secondary | ICD-10-CM

## 2021-06-09 DIAGNOSIS — N179 Acute kidney failure, unspecified: Secondary | ICD-10-CM | POA: Diagnosis not present

## 2021-06-09 DIAGNOSIS — N401 Enlarged prostate with lower urinary tract symptoms: Secondary | ICD-10-CM | POA: Diagnosis not present

## 2021-06-09 DIAGNOSIS — R339 Retention of urine, unspecified: Secondary | ICD-10-CM

## 2021-06-09 DIAGNOSIS — H9190 Unspecified hearing loss, unspecified ear: Secondary | ICD-10-CM | POA: Diagnosis not present

## 2021-06-09 DIAGNOSIS — N320 Bladder-neck obstruction: Secondary | ICD-10-CM | POA: Diagnosis not present

## 2021-06-09 DIAGNOSIS — S199XXA Unspecified injury of neck, initial encounter: Secondary | ICD-10-CM | POA: Diagnosis not present

## 2021-06-09 DIAGNOSIS — R600 Localized edema: Secondary | ICD-10-CM

## 2021-06-09 DIAGNOSIS — R351 Nocturia: Secondary | ICD-10-CM | POA: Diagnosis not present

## 2021-06-09 MED ORDER — CYANOCOBALAMIN 1000 MCG/ML IJ SOLN
1000.0000 ug | Freq: Once | INTRAMUSCULAR | Status: AC
  Administered 2021-06-09: 11:00:00 1000 ug via INTRAMUSCULAR

## 2021-06-09 NOTE — Progress Notes (Signed)
Pt was given B12 w/o any complications. 

## 2021-06-10 ENCOUNTER — Emergency Department (HOSPITAL_COMMUNITY): Payer: Medicare Other

## 2021-06-10 ENCOUNTER — Encounter (HOSPITAL_COMMUNITY): Payer: Self-pay

## 2021-06-10 ENCOUNTER — Observation Stay (HOSPITAL_COMMUNITY): Payer: Medicare Other

## 2021-06-10 DIAGNOSIS — R338 Other retention of urine: Secondary | ICD-10-CM | POA: Diagnosis not present

## 2021-06-10 DIAGNOSIS — W19XXXA Unspecified fall, initial encounter: Secondary | ICD-10-CM

## 2021-06-10 DIAGNOSIS — R609 Edema, unspecified: Secondary | ICD-10-CM

## 2021-06-10 DIAGNOSIS — Y92009 Unspecified place in unspecified non-institutional (private) residence as the place of occurrence of the external cause: Secondary | ICD-10-CM

## 2021-06-10 DIAGNOSIS — F10920 Alcohol use, unspecified with intoxication, uncomplicated: Secondary | ICD-10-CM

## 2021-06-10 DIAGNOSIS — N179 Acute kidney failure, unspecified: Secondary | ICD-10-CM | POA: Diagnosis not present

## 2021-06-10 DIAGNOSIS — R310 Gross hematuria: Secondary | ICD-10-CM | POA: Diagnosis not present

## 2021-06-10 DIAGNOSIS — N401 Enlarged prostate with lower urinary tract symptoms: Secondary | ICD-10-CM

## 2021-06-10 DIAGNOSIS — I10 Essential (primary) hypertension: Secondary | ICD-10-CM

## 2021-06-10 DIAGNOSIS — R351 Nocturia: Secondary | ICD-10-CM

## 2021-06-10 DIAGNOSIS — R600 Localized edema: Secondary | ICD-10-CM

## 2021-06-10 LAB — CBC WITH DIFFERENTIAL/PLATELET
Abs Immature Granulocytes: 0.03 10*3/uL (ref 0.00–0.07)
Basophils Absolute: 0 10*3/uL (ref 0.0–0.1)
Basophils Relative: 0 %
Eosinophils Absolute: 0.2 10*3/uL (ref 0.0–0.5)
Eosinophils Relative: 2 %
HCT: 31.5 % — ABNORMAL LOW (ref 39.0–52.0)
Hemoglobin: 10.3 g/dL — ABNORMAL LOW (ref 13.0–17.0)
Immature Granulocytes: 0 %
Lymphocytes Relative: 13 %
Lymphs Abs: 1 10*3/uL (ref 0.7–4.0)
MCH: 33 pg (ref 26.0–34.0)
MCHC: 32.7 g/dL (ref 30.0–36.0)
MCV: 101 fL — ABNORMAL HIGH (ref 80.0–100.0)
Monocytes Absolute: 0.6 10*3/uL (ref 0.1–1.0)
Monocytes Relative: 8 %
Neutro Abs: 6 10*3/uL (ref 1.7–7.7)
Neutrophils Relative %: 77 %
Platelets: 172 10*3/uL (ref 150–400)
RBC: 3.12 MIL/uL — ABNORMAL LOW (ref 4.22–5.81)
RDW: 13.1 % (ref 11.5–15.5)
WBC: 7.8 10*3/uL (ref 4.0–10.5)
nRBC: 0 % (ref 0.0–0.2)

## 2021-06-10 LAB — URINALYSIS, ROUTINE W REFLEX MICROSCOPIC
Bacteria, UA: NONE SEEN
Bilirubin Urine: NEGATIVE
Glucose, UA: NEGATIVE mg/dL
Ketones, ur: NEGATIVE mg/dL
Leukocytes,Ua: NEGATIVE
Nitrite: NEGATIVE
Protein, ur: NEGATIVE mg/dL
Specific Gravity, Urine: 1.006 (ref 1.005–1.030)
pH: 5 (ref 5.0–8.0)

## 2021-06-10 LAB — BASIC METABOLIC PANEL
Anion gap: 13 (ref 5–15)
Anion gap: 12 (ref 5–15)
BUN: 41 mg/dL — ABNORMAL HIGH (ref 8–23)
BUN: 37 mg/dL — ABNORMAL HIGH (ref 8–23)
CO2: 13 mmol/L — ABNORMAL LOW (ref 22–32)
CO2: 18 mmol/L — ABNORMAL LOW (ref 22–32)
Calcium: 8.8 mg/dL — ABNORMAL LOW (ref 8.9–10.3)
Calcium: 9.6 mg/dL (ref 8.9–10.3)
Chloride: 111 mmol/L (ref 98–111)
Chloride: 113 mmol/L — ABNORMAL HIGH (ref 98–111)
Creatinine, Ser: 3.19 mg/dL — ABNORMAL HIGH (ref 0.61–1.24)
Creatinine, Ser: 2.85 mg/dL — ABNORMAL HIGH (ref 0.61–1.24)
GFR, Estimated: 20 mL/min — ABNORMAL LOW (ref >60)
GFR, Estimated: 23 mL/min — ABNORMAL LOW (ref >60)
Glucose, Bld: 119 mg/dL — ABNORMAL HIGH (ref 70–99)
Glucose, Bld: 111 mg/dL — ABNORMAL HIGH (ref 70–99)
Potassium: 4.2 mmol/L (ref 3.5–5.1)
Potassium: 4.8 mmol/L (ref 3.5–5.1)
Sodium: 137 mmol/L (ref 135–145)
Sodium: 143 mmol/L (ref 135–145)

## 2021-06-10 LAB — RESP PANEL BY RT-PCR (FLU A&B, COVID) ARPGX2
Influenza A by PCR: NEGATIVE
Influenza B by PCR: NEGATIVE
SARS Coronavirus 2 by RT PCR: NEGATIVE

## 2021-06-10 LAB — ETHANOL: Alcohol, Ethyl (B): 266 mg/dL — ABNORMAL HIGH (ref <10)

## 2021-06-10 MED ORDER — TETANUS-DIPHTH-ACELL PERTUSSIS 5-2.5-18.5 LF-MCG/0.5 IM SUSY
0.5000 mL | PREFILLED_SYRINGE | Freq: Once | INTRAMUSCULAR | Status: AC
  Administered 2021-06-10: 01:00:00 0.5 mL via INTRAMUSCULAR
  Filled 2021-06-10: qty 0.5

## 2021-06-10 MED ORDER — LACTATED RINGERS IV SOLN: INTRAVENOUS | Status: DC

## 2021-06-10 MED ORDER — HEPARIN SODIUM (PORCINE) 5000 UNIT/ML IJ SOLN
5000.0000 [IU] | Freq: Three times a day (TID) | INTRAMUSCULAR | Status: DC
  Administered 2021-06-10 (×2): 5000 [IU] via SUBCUTANEOUS
  Filled 2021-06-10 (×2): qty 1

## 2021-06-10 MED ORDER — LIDOCAINE-EPINEPHRINE-TETRACAINE (LET) TOPICAL GEL
3.0000 mL | Freq: Once | TOPICAL | Status: DC
  Filled 2021-06-10: qty 3

## 2021-06-10 MED ORDER — ACETAMINOPHEN 650 MG RE SUPP: 650.0000 mg | Freq: Four times a day (QID) | RECTAL | Status: DC | PRN

## 2021-06-10 MED ORDER — HYDRALAZINE HCL 25 MG PO TABS
25.0000 mg | ORAL_TABLET | Freq: Three times a day (TID) | ORAL | Status: DC
  Administered 2021-06-10 – 2021-06-12 (×7): 25 mg via ORAL
  Filled 2021-06-10 (×7): qty 1

## 2021-06-10 MED ORDER — OXYCODONE HCL 5 MG PO TABS: 5.0000 mg | ORAL_TABLET | Freq: Four times a day (QID) | ORAL | Status: DC | PRN

## 2021-06-10 MED ORDER — HEPARIN (PORCINE) 25000 UT/250ML-% IV SOLN
1200.0000 [IU]/h | INTRAVENOUS | Status: AC
  Administered 2021-06-10: 18:00:00 1300 [IU]/h via INTRAVENOUS
  Filled 2021-06-10 (×3): qty 250

## 2021-06-10 MED ORDER — CHLORHEXIDINE GLUCONATE CLOTH 2 % EX PADS
6.0000 | MEDICATED_PAD | Freq: Every day | CUTANEOUS | Status: DC
  Administered 2021-06-10 – 2021-06-12 (×3): 6 via TOPICAL

## 2021-06-10 MED ORDER — SODIUM CHLORIDE 0.9 % IV BOLUS
1000.0000 mL | Freq: Once | INTRAVENOUS | Status: AC
  Administered 2021-06-10: 01:00:00 1000 mL via INTRAVENOUS

## 2021-06-10 MED ORDER — ONDANSETRON HCL 4 MG/2ML IJ SOLN: 4.0000 mg | Freq: Four times a day (QID) | INTRAMUSCULAR | Status: DC | PRN

## 2021-06-10 MED ORDER — TAMSULOSIN HCL 0.4 MG PO CAPS
0.4000 mg | ORAL_CAPSULE | Freq: Every day | ORAL | Status: DC
  Administered 2021-06-10 – 2021-06-12 (×3): 0.4 mg via ORAL
  Filled 2021-06-10 (×3): qty 1

## 2021-06-10 MED ORDER — FINASTERIDE 5 MG PO TABS
5.0000 mg | ORAL_TABLET | Freq: Every day | ORAL | Status: DC
  Administered 2021-06-10 – 2021-06-12 (×3): 5 mg via ORAL
  Filled 2021-06-10 (×3): qty 1

## 2021-06-10 MED ORDER — SODIUM CHLORIDE 0.45 % IV SOLN: INTRAVENOUS | Status: DC

## 2021-06-10 MED ORDER — ONDANSETRON HCL 4 MG PO TABS: 4.0000 mg | ORAL_TABLET | Freq: Four times a day (QID) | ORAL | Status: DC | PRN

## 2021-06-10 MED ORDER — ACETAMINOPHEN 325 MG PO TABS
650.0000 mg | ORAL_TABLET | Freq: Four times a day (QID) | ORAL | Status: DC | PRN
  Administered 2021-06-10: 15:00:00 650 mg via ORAL
  Filled 2021-06-10: qty 2

## 2021-06-10 NOTE — ED Notes (Signed)
ED TO INPATIENT HANDOFF REPORT  Name/Age/Gender Samuel Fisher 75 y.o. male  Code Status    Code Status Orders  (From admission, onward)           Start     Ordered   06/10/21 0343  Full code  Continuous        06/10/21 0342           Code Status History     Date Active Date Inactive Code Status Order ID Comments User Context   06/10/2021 0250 06/10/2021 0342 Full Code [id number]  Kristopher Oppenheim, DO ED       Home/SNF/Other Home  Chief Complaint AKI (acute kidney injury) [facility]) [N17.9]  Level of Care/Admitting Diagnosis ED Disposition     ED Disposition  Admit   Condition  --   [facility]: [facility] [[id number]]  Level of Care: Med-Surg [16]  May place patient in observation at [facility] or [place] if equivalent level of care is available:: No  Covid Evaluation: Asymptomatic Screening Protocol (No Symptoms)  Diagnosis: AKI (acute kidney injury) [facility]) [[id number]]  Admitting Physician: Bridgett Larsson [place]  Attending Physician: Bridgett Larsson, ERIC [[id number]]          Medical History Past Medical History:  Diagnosis Date   Allergy    Arthritis    BPH (benign prostatic hyperplasia) 2010   Brain tumor ([place])    Hearing problem    Hyperlipidemia    Hypertension    Inner ear dysfunction     Allergies Allergies  Allergen Reactions   Bee Venom Anaphylaxis    Allergic to Bee Sting -unknown reaction    IV Location/Drains/Wounds Patient Lines/Drains/Airways Status     Active Line/Drains/Airways     Name Placement date Placement time Site Days   Peripheral IV 06/10/21 22 G Anterior;Left Forearm 06/10/21  0048  Forearm  less than 1   Urethral Catheter Jess P Double-lumen 16 Fr. 06/10/21  0255  Double-lumen  less than 1            Labs/Imaging Results for orders placed or performed during the hospital encounter of 06/09/21 (from the past 48 hour(s))  Basic metabolic panel     Status: Abnormal   Collection Time: 06/10/21  12:30 AM  Result Value Ref Range   Sodium 137 135 - 145 mmol/L   Potassium 4.2 3.5 - 5.1 mmol/L   Chloride 111 98 - 111 mmol/L   CO2 13 (L) 22 - 32 mmol/L   Glucose, Bld 119 (H) 70 - 99 mg/dL    Comment: Glucose reference range applies only to samples taken after fasting for at least 8 hours.   BUN 41 (H) 8 - 23 mg/dL   Creatinine, Ser 3.19 (H) 0.61 - 1.24 mg/dL   Calcium 8.8 (L) 8.9 - 10.3 mg/dL   GFR, Estimated 20 (L) >60 mL/min    Comment: (NOTE) Calculated using the CKD-EPI Creatinine Equation (2021)    Anion gap 13 5 - 15    Comment: Performed at [facility], [place] [address], [place], [place] [address]  CBC with Differential     Status: Abnormal   Collection Time: 06/10/21 12:30 AM  Result Value Ref Range   WBC 7.8 4.0 - 10.5 K/uL   RBC 3.12 (L) 4.22 - 5.81 MIL/uL   Hemoglobin 10.3 (L) 13.0 - 17.0 g/dL   HCT 31.5 (L) 39.0 - 52.0 %   MCV 101.0 (H) 80.0 - 100.0 fL  MCH 33.0 26.0 - 34.0 pg   MCHC 32.7 30.0 - 36.0 g/dL   RDW 13.1 11.5 - 15.5 %   Platelets 172 150 - 400 K/uL   nRBC 0.0 0.0 - 0.2 %   Neutrophils Relative % 77 %   Neutro Abs 6.0 1.7 - 7.7 K/uL   Lymphocytes Relative 13 %   Lymphs Abs 1.0 0.7 - 4.0 K/uL   Monocytes Relative 8 %   Monocytes Absolute 0.6 0.1 - 1.0 K/uL   Eosinophils Relative 2 %   Eosinophils Absolute 0.2 0.0 - 0.5 K/uL   Basophils Relative 0 %   Basophils Absolute 0.0 0.0 - 0.1 K/uL   Immature Granulocytes 0 %   Abs Immature Granulocytes 0.03 0.00 - 0.07 K/uL    Comment: Performed at [facility], [place] [address], [place], [place] [address]  Ethanol     Status: Abnormal   Collection Time: 06/10/21 12:30 AM  Result Value Ref Range   Alcohol, Ethyl (B) 266 (H) <10 mg/dL    Comment: (NOTE) Lowest detectable limit for serum alcohol is 10 mg/dL.  For medical purposes only. Performed at [facility], [place] [address], [place], [place] [address]   Urinalysis, Routine w reflex  microscopic Urine, Clean Catch     Status: Abnormal   Collection Time: 06/10/21  1:48 AM  Result Value Ref Range   Color, Urine STRAW (A) YELLOW   APPearance CLEAR CLEAR   Specific Gravity, Urine 1.006 1.005 - 1.030   pH 5.0 5.0 - 8.0   Glucose, UA NEGATIVE NEGATIVE mg/dL   Hgb urine dipstick SMALL (A) NEGATIVE   Bilirubin Urine NEGATIVE NEGATIVE   Ketones, ur NEGATIVE NEGATIVE mg/dL   Protein, ur NEGATIVE NEGATIVE mg/dL   Nitrite NEGATIVE NEGATIVE   Leukocytes,Ua NEGATIVE NEGATIVE   RBC / HPF 0-5 0 - 5 RBC/hpf   WBC, UA 0-5 0 - 5 WBC/hpf   Bacteria, UA NONE SEEN NONE SEEN    Comment: Performed at [facility], [place] [address], [place], [place] [address]   CT Head Wo Contrast  Result Date: 06/10/2021 CLINICAL DATA:  Neck trauma, fall. EXAM: CT HEAD WITHOUT CONTRAST CT CERVICAL SPINE WITHOUT CONTRAST TECHNIQUE: Multidetector CT imaging of the head and cervical spine was performed following the standard protocol without intravenous contrast. Multiplanar CT image reconstructions of the cervical spine were also generated. COMPARISON:  12/31/2015 FINDINGS: CT HEAD FINDINGS Brain: No acute intracranial hemorrhage or midline shift. No extra-axial fluid collection. Mild diffuse atrophy is noted. Subcortical and periventricular white matter hypodensities are noted bilaterally and there is no hydrocephalus. A CSF collection is noted in the posterior fossa on the right resulting in mild mass effect on the right cerebellar hemisphere, possible arachnoid cyst versus postsurgical changes. Vascular: Atherosclerotic calcification of the carotid siphons. No hyperdense vessel. Skull: No acute fracture. Surgical changes are present in the occipital bone on the right Sinuses/Orbits: Mild mucosal thickening in the left maxillary sinus. Metallic density is noted in the right orbit. Other: There is a scalp hematoma with foci of air over the parietal bone on the right. CT CERVICAL SPINE FINDINGS  Alignment: There is mild anterolisthesis C7-T1. Skull base and vertebrae: No acute fracture. No primary bone lesion or focal pathologic process. There is fusion of the C5-C6 vertebral bodies. Soft tissues and spinal canal: No prevertebral fluid or swelling. No visible canal hematoma. Disc levels: Multilevel intervertebral disc space narrowing, osteophyte formation and facet arthropathy resulting in mild-to-moderate spinal  canal and moderate to severe neural foraminal stenosis at multiple levels. Upper chest: Negative. Other: Atherosclerotic calcification of the carotid bulbs. IMPRESSION: 1. No acute intracranial hemorrhage. 2. Scalp contusion containing air over the parietal bone on the right. 3. Atrophy with chronic microvascular ischemic changes. 4. Postsurgical changes in the occipital region on the right. 5. Multilevel degenerative changes in the cervical spine without evidence of acute fracture. Electronically Signed   By: Brett Fairy M.D.   On: 06/10/2021 00:50   CT Cervical Spine Wo Contrast  Result Date: 06/10/2021 CLINICAL DATA:  Neck trauma, fall. EXAM: CT HEAD WITHOUT CONTRAST CT CERVICAL SPINE WITHOUT CONTRAST TECHNIQUE: Multidetector CT imaging of the head and cervical spine was performed following the standard protocol without intravenous contrast. Multiplanar CT image reconstructions of the cervical spine were also generated. COMPARISON:  12/31/2015 FINDINGS: CT HEAD FINDINGS Brain: No acute intracranial hemorrhage or midline shift. No extra-axial fluid collection. Mild diffuse atrophy is noted. Subcortical and periventricular white matter hypodensities are noted bilaterally and there is no hydrocephalus. A CSF collection is noted in the posterior fossa on the right resulting in mild mass effect on the right cerebellar hemisphere, possible arachnoid cyst versus postsurgical changes. Vascular: Atherosclerotic calcification of the carotid siphons. No hyperdense vessel. Skull: No acute fracture.  Surgical changes are present in the occipital bone on the right Sinuses/Orbits: Mild mucosal thickening in the left maxillary sinus. Metallic density is noted in the right orbit. Other: There is a scalp hematoma with foci of air over the parietal bone on the right. CT CERVICAL SPINE FINDINGS Alignment: There is mild anterolisthesis C7-T1. Skull base and vertebrae: No acute fracture. No primary bone lesion or focal pathologic process. There is fusion of the C5-C6 vertebral bodies. Soft tissues and spinal canal: No prevertebral fluid or swelling. No visible canal hematoma. Disc levels: Multilevel intervertebral disc space narrowing, osteophyte formation and facet arthropathy resulting in mild-to-moderate spinal canal and moderate to severe neural foraminal stenosis at multiple levels. Upper chest: Negative. Other: Atherosclerotic calcification of the carotid bulbs. IMPRESSION: 1. No acute intracranial hemorrhage. 2. Scalp contusion containing air over the parietal bone on the right. 3. Atrophy with chronic microvascular ischemic changes. 4. Postsurgical changes in the occipital region on the right. 5. Multilevel degenerative changes in the cervical spine without evidence of acute fracture. Electronically Signed   By: Brett Fairy M.D.   On: 06/10/2021 00:50   US Renal  Result Date: 06/10/2021 CLINICAL DATA:  Renal failure. EXAM: RENAL / URINARY TRACT ULTRASOUND COMPLETE COMPARISON:  03/08/2021. FINDINGS: Right Kidney: Renal measurements: 11.3 x 6.7 x 6.1 = volume: 242.8 mL. Increased echogenicity. No renal calculus. Moderate hydronephrosis. Left Kidney: Renal measurements: 10.8 x 6.2 x 5.3 = volume: 184.7 mL. Increased echogenicity. No renal calculus. Moderate hydronephrosis. Bladder: The urinary bladder is markedly distended. Prevoid bladder volume 3036 mL. Patient unable to void for postvoid bladder volume. Other: The prostate gland is enlarged measuring 6.1 x 6.3 x 5.0 cm. IMPRESSION: 1. Markedly distended  urinary bladder with volume of 3036 mL. Patient was unable to void for examination, concerning for bladder outlet obstruction. 2. Moderate bilateral hydronephrosis. 3. Increased renal echogenicity bilaterally, compatible with medical renal disease. 4. Enlarged prostate gland. Electronically Signed   By: Brett Fairy M.D.   On: 06/10/2021 02:38    Pending Labs Unresulted Labs (From admission, onward)     Start     Ordered   06/11/21 0500  CBC with Differential/Platelet  Tomorrow morning,   R  06/10/21 0342   06/11/21 0500  Comprehensive metabolic panel  Tomorrow morning,   R        06/10/21 0342   06/10/21 0226  Resp Panel by RT-PCR (Flu A&B, Covid) Nasopharyngeal Swab  (Tier 2 - Symptomatic/asymptomatic)  Once,   STAT        06/10/21 0225            Vitals/Pain Today's Vitals   06/10/21 0100 06/10/21 0115 06/10/21 0130 06/10/21 0200  BP: (!) 164/90 (!) 148/83 137/84 (!) 153/94  Pulse: 82 84 81 85  Resp:  18  20  Temp:      TempSrc:      SpO2: 90% 100% 99% 100%  Weight:      Height:      PainSc:        Isolation Precautions No active isolations  Medications Medications  lidocaine-EPINEPHrine-tetracaine (LET) topical gel (3 mLs Topical Not Given 06/10/21 0325)  lactated ringers infusion (has no administration in time range)  tamsulosin (FLOMAX) capsule 0.4 mg (has no administration in time range)  finasteride (PROSCAR) tablet 5 mg (has no administration in time range)  heparin injection 5,000 Units (has no administration in time range)  acetaminophen (TYLENOL) tablet 650 mg (has no administration in time range)    Or  acetaminophen (TYLENOL) suppository 650 mg (has no administration in time range)  ondansetron (ZOFRAN) tablet 4 mg (has no administration in time range)    Or  ondansetron (ZOFRAN) injection 4 mg (has no administration in time range)  Tdap (BOOSTRIX) injection 0.5 mL (0.5 mLs Intramuscular Given 06/10/21 0049)  sodium chloride 0.9 % bolus 1,000 mL  (0 mLs Intravenous Stopped 06/10/21 0325)    Mobility walks with person assist

## 2021-06-10 NOTE — Assessment & Plan Note (Signed)
Likely due to his alcohol intoxication.  He has a laceration on the posterior scalp.  EDP is going to repair this.

## 2021-06-10 NOTE — H&P (Addendum)
History and Physical    Samuel Fisher RWE:[id number] DOB: Aug 03, 1945 DOA: 06/09/2021  PCP: Samuel Lima, MD   Patient coming from: Home  I have personally briefly reviewed patient's old medical records in [place]  CC: fall at home HPI: 75 year old male with a history of hypertension, BPH, hearing loss presents the ER today after a fall at home.  Patient is here with his wife.  Wife corroborates the history.  Wife states that the patient and her were having friends over yesterday evening to watch the [place] football game.  They were drinking in moderation.  SHe states that she was not aware how much patient was drinking.  He states that the patient had several cocktails before dinner.  They also shared a bottle of wine.  SHe states that the patient got up from the chair.  He stumbled backwards.  He hit his head against a bookshelf.  He cut his head.  There was blood on the floor.  She called 911.  In the ER, patient noted to have an alcohol level of 266.  Bladder scan showed greater than 1 L in his bladder.  Serum creatinine was increased to 3.19 with a BUN of 41.  Patient states that he sees Dr. [facility] with [facility].  He states he is supposed to have an MRI of his prostate.  Patient takes no prostate medications.  [facility] hospital[facility] contacted for admission due to AKI and urinary retention.   ED Course: noted to have AKI. Etoh >260. Bladder scan showed >1 liter in his bladder.  Review of Systems:  Review of Systems  Constitutional: Negative.   HENT: Negative.    Eyes: Negative.   Respiratory: Negative.    Cardiovascular: Negative.   Gastrointestinal: Negative.   Genitourinary:        Long history of BPH symptoms including nocturia, occasional dribbling.  Musculoskeletal: Negative.   Skin:        Laceration to scalp  Neurological: Negative.   Endo/Heme/Allergies: Negative.   Psychiatric/Behavioral: Negative.    All other systems reviewed and are  negative.  Past Medical History:  Diagnosis Date   Allergy    Arthritis    BPH (benign prostatic hyperplasia) 2010   Brain tumor ([place])    Hearing problem    Hyperlipidemia    Hypertension    Inner ear dysfunction     Past Surgical History:  Procedure Laterality Date   [place] ARTHROPLASTY     right-2002 and left-2004, 2010     reports that he quit smoking about 44 years ago. His smoking use included cigarettes. He has never used smokeless tobacco. He reports current alcohol use of about 3.0 standard drinks per week. He reports that he does not use drugs.  Allergies  Allergen Reactions   Bee Venom Anaphylaxis    Allergic to Bee Sting -unknown reaction    Family History  Problem Relation Age of Onset   Uterine cancer Mother    Rheum arthritis Mother    Hypertension Father    Throat cancer Father        smoker   Arthritis Other    Heart disease Other    Hypertension Other    Stroke Other    Early death Other    Mental illness Other    Heart attack Brother        smoker  Heart attack Maternal Grandfather    Hypertension Brother    Stroke Brother    Diabetes Neg Hx    Hyperlipidemia Neg Hx     Prior to Admission medications   Medication Sig Start Date End Date Taking? Authorizing Provider  amoxicillin (AMOXIL) 500 MG capsule Take 2,000 mg by mouth once as needed (for dentist appointment).    [provider]  aspirin 325 MG tablet Take 325 mg by mouth every 4 (four) hours as needed for moderate pain or headache.     [provider]  Azilsartan Medoxomil (EDARBI) 80 MG TABS Take 1 tablet (80 mg total) by mouth daily. 01/20/21   Samuel Lima, MD  Cyanocobalamin (VITAMIN B-12 IJ) Inject 1,000 mcg as directed every 30 (thirty) days.    [provider]  fluticasone (FLONASE) 50 MCG/ACT nasal spray Place 1-2 sprays into both nostrils daily as needed. allergies 12/03/15    [provider]  rosuvastatin (CRESTOR) 5 MG tablet Take 1 tablet (5 mg total) by mouth daily. 04/22/21   Samuel Lima, MD    Physical Exam: Vitals:   06/10/21 0100 06/10/21 0115 06/10/21 0130 06/10/21 0200  BP: (!) 164/90 (!) 148/83 137/84 (!) 153/94  Pulse: 82 84 81 85  Resp:  18  20  Temp:      TempSrc:      SpO2: 90% 100% 99% 100%  Weight:      Height:        Physical Exam Vitals and nursing note reviewed.  Constitutional:      General: He is not in acute distress.    Appearance: Normal appearance. He is normal weight. He is not ill-appearing, toxic-appearing or diaphoretic.  HENT:     Head: Normocephalic.     Comments: Posterior scalp laceration    Nose: Nose normal. No rhinorrhea.  Eyes:     General:        Right eye: No discharge.        Left eye: No discharge.  Cardiovascular:     Rate and Rhythm: Normal rate and regular rhythm.     Pulses: Normal pulses.  Pulmonary:     Effort: Pulmonary effort is normal. No respiratory distress.     Breath sounds: Normal breath sounds. No wheezing or rales.  Abdominal:     General: Abdomen is protuberant. Bowel sounds are normal. There is no distension.     Tenderness: There is no abdominal tenderness. There is no guarding or rebound.     Comments: Abd distended  Musculoskeletal:     Right lower leg: No edema.     Left lower leg: Edema present.     Comments: +2 pitting edema of left leg  Skin:    General: Skin is warm and dry.     Capillary Refill: Capillary refill takes less than 2 seconds.  Neurological:     General: No focal deficit present.     Mental Status: He is alert and oriented to person, place, and time.     Comments: Extremely hard of hearing     Labs on Admission: I have personally reviewed following labs and imaging studies  CBC: Recent Labs  Lab 06/10/21 0030  WBC 7.8  NEUTROABS 6.0  HGB 10.3*  HCT 31.5*  MCV 101.0*  PLT 132   Basic Metabolic Panel: Recent Labs  Lab 06/10/21 0030   NA 137  K 4.2  CL 111  CO2 13*  GLUCOSE 119*  BUN 41*  CREATININE  3.19*  CALCIUM 8.8*   GFR: Estimated Creatinine Clearance: 21 mL/min (A) (by C-G formula based on SCr of 3.19 mg/dL (H)). Liver Function Tests: No results for input(s): AST, ALT, ALKPHOS, BILITOT, PROT, ALBUMIN in the last 168 hours. No results for input(s): LIPASE, AMYLASE in the last 168 hours. No results for input(s): AMMONIA in the last 168 hours. Coagulation Profile: No results for input(s): INR, PROTIME in the last 168 hours. Cardiac Enzymes: No results for input(s): CKTOTAL, CKMB, CKMBINDEX, TROPONINI in the last 168 hours. BNP (last 3 results) No results for input(s): PROBNP in the last 8760 hours. HbA1C: No results for input(s): HGBA1C in the last 72 hours. CBG: No results for input(s): GLUCAP in the last 168 hours. Lipid Profile: No results for input(s): CHOL, HDL, LDLCALC, TRIG, CHOLHDL, LDLDIRECT in the last 72 hours. Thyroid Function Tests: No results for input(s): TSH, T4TOTAL, FREET4, T3FREE, THYROIDAB in the last 72 hours. Anemia Panel: No results for input(s): VITAMINB12, FOLATE, FERRITIN, TIBC, IRON, RETICCTPCT in the last 72 hours. Urine analysis:    Component Value Date/Time   COLORURINE STRAW (A) 06/10/2021 0148   APPEARANCEUR CLEAR 06/10/2021 0148   LABSPEC 1.006 06/10/2021 0148   PHURINE 5.0 06/10/2021 0148   GLUCOSEU NEGATIVE 06/10/2021 0148   GLUCOSEU NEGATIVE 01/20/2021 0931   HGBUR SMALL (A) 06/10/2021 0148   BILIRUBINUR NEGATIVE 06/10/2021 0148   KETONESUR NEGATIVE 06/10/2021 0148   PROTEINUR NEGATIVE 06/10/2021 0148   UROBILINOGEN 0.2 01/20/2021 0931   NITRITE NEGATIVE 06/10/2021 0148   LEUKOCYTESUR NEGATIVE 06/10/2021 0148    Radiological Exams on Admission: I have personally reviewed images CT Head Wo Contrast  Result Date: 06/10/2021 CLINICAL DATA:  Neck trauma, fall. EXAM: CT HEAD WITHOUT CONTRAST CT CERVICAL SPINE WITHOUT CONTRAST TECHNIQUE: Multidetector CT  imaging of the head and cervical spine was performed following the standard protocol without intravenous contrast. Multiplanar CT image reconstructions of the cervical spine were also generated. COMPARISON:  12/31/2015 FINDINGS: CT HEAD FINDINGS Brain: No acute intracranial hemorrhage or midline shift. No extra-axial fluid collection. Mild diffuse atrophy is noted. Subcortical and periventricular white matter hypodensities are noted bilaterally and there is no hydrocephalus. A CSF collection is noted in the posterior fossa on the right resulting in mild mass effect on the right cerebellar hemisphere, possible arachnoid cyst versus postsurgical changes. Vascular: Atherosclerotic calcification of the carotid siphons. No hyperdense vessel. Skull: No acute fracture. Surgical changes are present in the occipital bone on the right Sinuses/Orbits: Mild mucosal thickening in the left maxillary sinus. Metallic density is noted in the right orbit. Other: There is a scalp hematoma with foci of air over the parietal bone on the right. CT CERVICAL SPINE FINDINGS Alignment: There is mild anterolisthesis C7-T1. Skull base and vertebrae: No acute fracture. No primary bone lesion or focal pathologic process. There is fusion of the C5-C6 vertebral bodies. Soft tissues and spinal canal: No prevertebral fluid or swelling. No visible canal hematoma. Disc levels: Multilevel intervertebral disc space narrowing, osteophyte formation and facet arthropathy resulting in mild-to-moderate spinal canal and moderate to severe neural foraminal stenosis at multiple levels. Upper chest: Negative. Other: Atherosclerotic calcification of the carotid bulbs. IMPRESSION: 1. No acute intracranial hemorrhage. 2. Scalp contusion containing air over the parietal bone on the right. 3. Atrophy with chronic microvascular ischemic changes. 4. Postsurgical changes in the occipital region on the right. 5. Multilevel degenerative changes in the cervical spine  without evidence of acute fracture. Electronically Signed   By: Regan Rakers.D.  On: 06/10/2021 00:50   CT Cervical Spine Wo Contrast  Result Date: 06/10/2021 CLINICAL DATA:  Neck trauma, fall. EXAM: CT HEAD WITHOUT CONTRAST CT CERVICAL SPINE WITHOUT CONTRAST TECHNIQUE: Multidetector CT imaging of the head and cervical spine was performed following the standard protocol without intravenous contrast. Multiplanar CT image reconstructions of the cervical spine were also generated. COMPARISON:  12/31/2015 FINDINGS: CT HEAD FINDINGS Brain: No acute intracranial hemorrhage or midline shift. No extra-axial fluid collection. Mild diffuse atrophy is noted. Subcortical and periventricular white matter hypodensities are noted bilaterally and there is no hydrocephalus. A CSF collection is noted in the posterior fossa on the right resulting in mild mass effect on the right cerebellar hemisphere, possible arachnoid cyst versus postsurgical changes. Vascular: Atherosclerotic calcification of the carotid siphons. No hyperdense vessel. Skull: No acute fracture. Surgical changes are present in the occipital bone on the right Sinuses/Orbits: Mild mucosal thickening in the left maxillary sinus. Metallic density is noted in the right orbit. Other: There is a scalp hematoma with foci of air over the parietal bone on the right. CT CERVICAL SPINE FINDINGS Alignment: There is mild anterolisthesis C7-T1. Skull base and vertebrae: No acute fracture. No primary bone lesion or focal pathologic process. There is fusion of the C5-C6 vertebral bodies. Soft tissues and spinal canal: No prevertebral fluid or swelling. No visible canal hematoma. Disc levels: Multilevel intervertebral disc space narrowing, osteophyte formation and facet arthropathy resulting in mild-to-moderate spinal canal and moderate to severe neural foraminal stenosis at multiple levels. Upper chest: Negative. Other: Atherosclerotic calcification of the carotid bulbs.  IMPRESSION: 1. No acute intracranial hemorrhage. 2. Scalp contusion containing air over the parietal bone on the right. 3. Atrophy with chronic microvascular ischemic changes. 4. Postsurgical changes in the occipital region on the right. 5. Multilevel degenerative changes in the cervical spine without evidence of acute fracture. Electronically Signed   By: Brett Fairy M.D.   On: 06/10/2021 00:50   US Renal  Result Date: 06/10/2021 CLINICAL DATA:  Renal failure. EXAM: RENAL / URINARY TRACT ULTRASOUND COMPLETE COMPARISON:  03/08/2021. FINDINGS: Right Kidney: Renal measurements: 11.3 x 6.7 x 6.1 = volume: 242.8 mL. Increased echogenicity. No renal calculus. Moderate hydronephrosis. Left Kidney: Renal measurements: 10.8 x 6.2 x 5.3 = volume: 184.7 mL. Increased echogenicity. No renal calculus. Moderate hydronephrosis. Bladder: The urinary bladder is markedly distended. Prevoid bladder volume 3036 mL. Patient unable to void for postvoid bladder volume. Other: The prostate gland is enlarged measuring 6.1 x 6.3 x 5.0 cm. IMPRESSION: 1. Markedly distended urinary bladder with volume of 3036 mL. Patient was unable to void for examination, concerning for bladder outlet obstruction. 2. Moderate bilateral hydronephrosis. 3. Increased renal echogenicity bilaterally, compatible with medical renal disease. 4. Enlarged prostate gland. Electronically Signed   By: Brett Fairy M.D.   On: 06/10/2021 02:38    EKG: I have personally reviewed EKG: no EKG performed  Assessment/Plan Principal Problem:   AKI (acute kidney injury) ([place]) Active Problems:   Acute urinary retention   Fall at home, initial encounter   Essential hypertension, benign   BPH (benign prostatic hyperplasia)   Alcoholic intoxication without complication ([place])   Leg edema, left    AKI (acute kidney injury) ([place]) Assigned observation.  Continue IV fluids.  Likely AKI due to obstructive uropathy due to large prostate.  Foley catheter will be  placed.  He will likely need to go home with a Foley catheter and outpatient urology follow-up for a postvoid residual.  We will go  ahead and start him on Flomax and Proscar.  Acute urinary retention Patient needed Foley catheter placed.  He will need to go home with a catheter.  Fall at home, initial encounter Likely due to his alcohol intoxication.  He has a laceration on the posterior scalp.  EDP is going to repair this.  Essential hypertension, benign Hold his ARB due to AKI.  He may need another antihypertensive depending upon his renal recovery.  BPH (benign prostatic hyperplasia) Chronic.  Patient has an elevated PSA.  He states that he follows with urology.  We will start Proscar and Flomax.  Alcoholic intoxication without complication [facility]) Wife states the patient does not drink alcohol every day.  He states this is a special occasion to the [facility] football game.  Leg edema, left Check LE U/S to rule out DVT.  DVT prophylaxis: Lovenox Code Status: Full Code Family Communication: discussed with pt and wife(vicki) at bedside Disposition Plan: return home  Consults called: none  Admission status: Observation, Med-Surg   Kristopher Oppenheim, DO [facility] 06/10/2021, 3:26 AM

## 2021-06-10 NOTE — Assessment & Plan Note (Signed)
Assigned observation.  Continue IV fluids.  Likely AKI due to obstructive uropathy due to large prostate.  Foley catheter will be placed.  He will likely need to go home with a Foley catheter and outpatient urology follow-up for a postvoid residual.  We will go ahead and start him on Flomax and Proscar.

## 2021-06-10 NOTE — Assessment & Plan Note (Signed)
Patient needed Foley catheter placed.  He will need to go home with a catheter.

## 2021-06-10 NOTE — Progress Notes (Signed)
[place] for heparin Indication: acute DVT  Allergies  Allergen Reactions   Bee Venom Anaphylaxis    Allergic to Bee Sting -unknown reaction    Patient Measurements: Height: 5' 9.5" (176.5 cm) Weight: 85.7 kg (189 lb) IBW/kg (Calculated) : 71.85 Heparin Dosing Weight: 86 kg  Vital Signs: Temp: 98 F (36.7 C) (12/22 1218) Temp Source: Oral (12/22 1218) BP: 157/86 (12/22 1218) Pulse Rate: 128 (12/22 1218)  Labs: Recent Labs    06/10/21 0030 06/10/21 0931  HGB 10.3*  --   HCT 31.5*  --   PLT 172  --   CREATININE 3.19* 2.85*    Estimated Creatinine Clearance: 23.5 mL/min (A) (by C-G formula based on SCr of 2.85 mg/dL (H)).   Assessment: Patient is a 75 y.o M presented to the ED on 12/21 s/p fall at home with scalp laceration. Head CT showed scalp contusion and was negative for acute intracranial hemorrhage.  He also c/o left LLE swelling with LE doppler on 12/22 came back positive for "acute deep vein thrombosis involving the left  peroneal veins."  Pharmacy has been consulted to dose heparin for VTE treatment.  - patient received heparin 5000 units SQ at 1443 on 12/22  Goal of Therapy:  Heparin level 0.3-0.7 units/ml Monitor platelets by anticoagulation protocol: Yes   Plan:  - d/c heparin 5000 units SQ q8h - start heparin drip at 1300 units/hr (per [place] Calc). Will not bolus since patient just received the 5000 units SQ dose - check 8 hr heparin level - monitor for s/sx bleeding  Sianni Cloninger P 06/10/2021,4:19 PM

## 2021-06-10 NOTE — Assessment & Plan Note (Signed)
Hold his ARB due to AKI.  He may need another antihypertensive depending upon his renal recovery.

## 2021-06-10 NOTE — Assessment & Plan Note (Signed)
Chronic.  Patient has an elevated PSA.  He states that he follows with urology.  We will start Proscar and Flomax.

## 2021-06-10 NOTE — Subjective & Objective (Signed)
CC: fall at home HPI: 75 year old male with a history of hypertension, BPH, hearing loss presents the ER today after a fall at home.  Patient is here with his wife.  Wife corroborates the history.  Wife states that the patient and her were having friends over yesterday evening to watch the [place] football game.  They were drinking in moderation.  SHe states that she was not aware how much patient was drinking.  He states that the patient had several cocktails before dinner.  They also shared a bottle of wine.  SHe states that the patient got up from the chair.  He stumbled backwards.  He hit his head against a bookshelf.  He cut his head.  There was blood on the floor.  She called 911.  In the ER, patient noted to have an alcohol level of 266.  Bladder scan showed greater than 1 L in his bladder.  Serum creatinine was increased to 3.19 with a BUN of 41.  Patient states that he sees Dr. [facility] with [facility].  He states he is supposed to have an MRI of his prostate.  Patient takes no prostate medications.  [facility] hospital[facility] contacted for admission due to AKI and urinary retention.

## 2021-06-10 NOTE — Assessment & Plan Note (Signed)
Wife states the patient does not drink alcohol every day.  He states this is a special occasion to the [facility] football game.

## 2021-06-10 NOTE — Assessment & Plan Note (Signed)
Check LE U/S to rule out DVT.

## 2021-06-10 NOTE — Consult Note (Signed)
Urology Consult   Physician requesting consult: Shawna Clamp, MD  Reason for consult: Acute urinary retention  History of Present Illness: Samuel Fisher is a 75 y.o. presented to the ED after suffering a fall from home after assuring some cocktails and a bottle of wine.  In the ER, he was found to have acute alcohol intoxication as well as a bladder scan over 1 L.  Foley catheter was placed with return of 3 L clear yellow urine.  Patient follows with Dr. Louis Meckel with urology outpatient with a history of an elevated PSA and mild lower urine tract symptoms. He denies taking alpha-blocker or finasteride at baseline.  He does have a history of a prior TRUS biopsy in 2014 and 2015 that were negative for cancer.  His TRUS volume was 70 g.  He states that at baseline, he has a strong flow stream and denies sensation of incomplete bladder emptying.  He states that the past week, he is seen maybe slightly worse lower urinary tract symptoms with increased frequency and mildly declined urine flow.  His last PSA in 12/2020 was elevated 8.44.  He is due to follow-up with Dr. Louis Meckel in January 2023.  After Foley catheter was placed, and after this did drain 3 L clear yellow, his urine became slight pink color.  He denies abdominal pain or pelvic pain.  There has been no clots to the past.  He does have a smoking history however quit about 44 years ago.  He denies a family history of urologic malignancy.  He denies taking anticoagulation.   Past Medical History:  Diagnosis Date   Allergy    Arthritis    BPH (benign prostatic hyperplasia) 2010   Brain tumor ([place])    Hearing problem    Hyperlipidemia    Hypertension    Inner ear dysfunction     Past Surgical History:  Procedure Laterality Date   BRAIN TUMOR EXCISION     NERVE GRAFT     TONSILLECTOMY  1951   TOTAL HIP ARTHROPLASTY     right-2002 and left-2004, 2010    Medications:  Home meds:  No current facility-administered medications on  file prior to encounter.   Current Outpatient Medications on File Prior to Encounter  Medication Sig Dispense Refill   amoxicillin (AMOXIL) 500 MG capsule Take 2,000 mg by mouth once as needed (for dentist appointment).     Azilsartan Medoxomil (EDARBI) 80 MG TABS Take 1 tablet (80 mg total) by mouth daily. 90 tablet 1   Cyanocobalamin (VITAMIN B-12 IJ) Inject 1,000 mcg as directed every 30 (thirty) days.     fluticasone (FLONASE) 50 MCG/ACT nasal spray Place 1-2 sprays into both nostrils daily as needed for allergies.  2   ibuprofen (ADVIL) 200 MG tablet Take 600 mg by mouth in the morning and at bedtime.       Scheduled Meds:  Chlorhexidine Gluconate Cloth  6 each Topical Daily   finasteride  5 mg Oral Daily   heparin  5,000 Units Subcutaneous Q8H   hydrALAZINE  25 mg Oral Q8H   tamsulosin  0.4 mg Oral Daily   Continuous Infusions:  lactated ringers 100 mL/hr at 06/10/21 1446   PRN Meds:.acetaminophen **OR** acetaminophen, ondansetron **OR** ondansetron (ZOFRAN) IV, oxyCODONE  Allergies:  Allergies  Allergen Reactions   Bee Venom Anaphylaxis    Allergic to Bee Sting -unknown reaction    Family History  Problem Relation Age of Onset   Uterine cancer Mother  Rheum arthritis Mother    Hypertension Father    Throat cancer Father        smoker   Arthritis Other    Heart disease Other    Hypertension Other    Stroke Other    Early death Other    Mental illness Other    Heart attack Brother        smoker   Heart attack Maternal Grandfather    Hypertension Brother    Stroke Brother    Diabetes Neg Hx    Hyperlipidemia Neg Hx     Social History:  reports that he quit smoking about 44 years ago. His smoking use included cigarettes. He has never used smokeless tobacco. He reports current alcohol use of about 3.0 standard drinks per week. He reports that he does not use drugs.  ROS: A complete review of systems was performed.  All systems are negative except for  pertinent findings as noted.  Physical Exam:  Vital signs in last 24 hours: Temp:  [97.8 F (36.6 C)-98.6 F (37 C)] 98 F (36.7 C) (12/22 1218) Pulse Rate:  [80-128] 128 (12/22 1218) Resp:  [16-20] 16 (12/22 1218) BP: (137-194)/(83-123) 157/86 (12/22 1218) SpO2:  [90 %-100 %] 98 % (12/22 1218) Weight:  [85.7 kg] 85.7 kg (12/22 0006) Constitutional:  Alert and oriented, No acute distress Cardiovascular: Regular rate and rhythm Respiratory: Normal respiratory effort, Lungs clear bilaterally GI: Abdomen is soft, nontender, nondistended, no abdominal masses Genitourinary: No CVAT. Normal male phallus, testes are descended bilaterally and non-tender and without masses, scrotum is normal in appearance without lesions or masses, perineum is normal on inspection. Rectal: Normal sphincter tone, no rectal masses, prostate is non tender and without nodularity. Prostate size is estimated to be 75 cc Neurologic: Grossly intact, no focal deficits Psychiatric: Normal mood and affect  Laboratory Data:  Recent Labs    06/10/21 0030  WBC 7.8  HGB 10.3*  HCT 31.5*  PLT 172    Recent Labs    06/10/21 0030 06/10/21 0931  NA 137 143  K 4.2 4.8  CL 111 113*  GLUCOSE 119* 111*  BUN 41* 37*  CALCIUM 8.8* 9.6  CREATININE 3.19* 2.85*     Results for orders placed or performed during the hospital encounter of 06/09/21 (from the past 24 hour(s))  Basic metabolic panel     Status: Abnormal   Collection Time: 06/10/21 12:30 AM  Result Value Ref Range   Sodium 137 135 - 145 mmol/L   Potassium 4.2 3.5 - 5.1 mmol/L   Chloride 111 98 - 111 mmol/L   CO2 13 (L) 22 - 32 mmol/L   Glucose, Bld 119 (H) 70 - 99 mg/dL   BUN 41 (H) 8 - 23 mg/dL   Creatinine, Ser 3.19 (H) 0.61 - 1.24 mg/dL   Calcium 8.8 (L) 8.9 - 10.3 mg/dL   GFR, Estimated 20 (L) >60 mL/min   Anion gap 13 5 - 15  CBC with Differential     Status: Abnormal   Collection Time: 06/10/21 12:30 AM  Result Value Ref Range   WBC 7.8 4.0  - 10.5 K/uL   RBC 3.12 (L) 4.22 - 5.81 MIL/uL   Hemoglobin 10.3 (L) 13.0 - 17.0 g/dL   HCT 31.5 (L) 39.0 - 52.0 %   MCV 101.0 (H) 80.0 - 100.0 fL   MCH 33.0 26.0 - 34.0 pg   MCHC 32.7 30.0 - 36.0 g/dL   RDW 13.1 11.5 - 15.5 %  Platelets 172 150 - 400 K/uL   nRBC 0.0 0.0 - 0.2 %   Neutrophils Relative % 77 %   Neutro Abs 6.0 1.7 - 7.7 K/uL   Lymphocytes Relative 13 %   Lymphs Abs 1.0 0.7 - 4.0 K/uL   Monocytes Relative 8 %   Monocytes Absolute 0.6 0.1 - 1.0 K/uL   Eosinophils Relative 2 %   Eosinophils Absolute 0.2 0.0 - 0.5 K/uL   Basophils Relative 0 %   Basophils Absolute 0.0 0.0 - 0.1 K/uL   Immature Granulocytes 0 %   Abs Immature Granulocytes 0.03 0.00 - 0.07 K/uL  Ethanol     Status: Abnormal   Collection Time: 06/10/21 12:30 AM  Result Value Ref Range   Alcohol, Ethyl (B) 266 (H) <10 mg/dL  Urinalysis, Routine w reflex microscopic Urine, Clean Catch     Status: Abnormal   Collection Time: 06/10/21  1:48 AM  Result Value Ref Range   Color, Urine STRAW (A) YELLOW   APPearance CLEAR CLEAR   Specific Gravity, Urine 1.006 1.005 - 1.030   pH 5.0 5.0 - 8.0   Glucose, UA NEGATIVE NEGATIVE mg/dL   Hgb urine dipstick SMALL (A) NEGATIVE   Bilirubin Urine NEGATIVE NEGATIVE   Ketones, ur NEGATIVE NEGATIVE mg/dL   Protein, ur NEGATIVE NEGATIVE mg/dL   Nitrite NEGATIVE NEGATIVE   Leukocytes,Ua NEGATIVE NEGATIVE   RBC / HPF 0-5 0 - 5 RBC/hpf   WBC, UA 0-5 0 - 5 WBC/hpf   Bacteria, UA NONE SEEN NONE SEEN  Resp Panel by RT-PCR (Flu A&B, Covid) Nasopharyngeal Swab     Status: None   Collection Time: 06/10/21  3:39 AM   Specimen: Nasopharyngeal Swab; Nasopharyngeal(NP) swabs in vial transport medium  Result Value Ref Range   SARS Coronavirus 2 by RT PCR NEGATIVE NEGATIVE   Influenza A by PCR NEGATIVE NEGATIVE   Influenza B by PCR NEGATIVE NEGATIVE  Basic metabolic panel     Status: Abnormal   Collection Time: 06/10/21  9:31 AM  Result Value Ref Range   Sodium 143 135 - 145  mmol/L   Potassium 4.8 3.5 - 5.1 mmol/L   Chloride 113 (H) 98 - 111 mmol/L   CO2 18 (L) 22 - 32 mmol/L   Glucose, Bld 111 (H) 70 - 99 mg/dL   BUN 37 (H) 8 - 23 mg/dL   Creatinine, Ser 2.85 (H) 0.61 - 1.24 mg/dL   Calcium 9.6 8.9 - 10.3 mg/dL   GFR, Estimated 23 (L) >60 mL/min   Anion gap 12 5 - 15   Recent Results (from the past 240 hour(s))  Resp Panel by RT-PCR (Flu A&B, Covid) Nasopharyngeal Swab     Status: None   Collection Time: 06/10/21  3:39 AM   Specimen: Nasopharyngeal Swab; Nasopharyngeal(NP) swabs in vial transport medium  Result Value Ref Range Status   SARS Coronavirus 2 by RT PCR NEGATIVE NEGATIVE Final    Comment: (NOTE) SARS-CoV-2 target nucleic acids are NOT DETECTED.  The SARS-CoV-2 RNA is generally detectable in upper respiratory specimens during the acute phase of infection. The lowest concentration of SARS-CoV-2 viral copies this assay can detect is 138 copies/mL. A negative result does not preclude SARS-Cov-2 infection and should not be used as the sole basis for treatment or other patient management decisions. A negative result may occur with  improper specimen collection/handling, submission of specimen other than nasopharyngeal swab, presence of viral mutation(s) within the areas targeted by this assay, and inadequate number of  viral copies(<138 copies/mL). A negative result must be combined with clinical observations, patient history, and epidemiological information. The expected result is Negative.  Fact Sheet for Patients:  EntrepreneurPulse.com.au  Fact Sheet for Healthcare Providers:  IncredibleEmployment.be  This test is no t yet approved or cleared by the [place] FDA and  has been authorized for detection and/or diagnosis of SARS-CoV-2 by FDA under an Emergency Use Authorization (EUA). This EUA will remain  in effect (meaning this test can be used) for the duration of the COVID-19 declaration under  Section 564(b)(1) of the Act, 21 U.S.C.section 360bbb-3(b)(1), unless the authorization is terminated  or revoked sooner.       Influenza A by PCR NEGATIVE NEGATIVE Final   Influenza B by PCR NEGATIVE NEGATIVE Final    Comment: (NOTE) The Xpert Xpress SARS-CoV-2/FLU/RSV plus assay is intended as an aid in the diagnosis of influenza from Nasopharyngeal swab specimens and should not be used as a sole basis for treatment. Nasal washings and aspirates are unacceptable for Xpert Xpress SARS-CoV-2/FLU/RSV testing.  Fact Sheet for Patients: EntrepreneurPulse.com.au  Fact Sheet for Healthcare Providers: IncredibleEmployment.be  This test is not yet approved or cleared by the [place] FDA and has been authorized for detection and/or diagnosis of SARS-CoV-2 by FDA under an Emergency Use Authorization (EUA). This EUA will remain in effect (meaning this test can be used) for the duration of the COVID-19 declaration under Section 564(b)(1) of the Act, 21 U.S.C. section 360bbb-3(b)(1), unless the authorization is terminated or revoked.  Performed at [facility], [place] [address] [place], [place] [address]     Renal Function: Recent Labs    06/10/21 0030 06/10/21 0931  CREATININE 3.19* 2.85*   Estimated Creatinine Clearance: 23.5 mL/min (A) (by C-G formula based on SCr of 2.85 mg/dL (H)).  Radiologic Imaging: CT Head Wo Contrast  Result Date: 06/10/2021 CLINICAL DATA:  Neck trauma, fall. EXAM: CT HEAD WITHOUT CONTRAST CT CERVICAL SPINE WITHOUT CONTRAST TECHNIQUE: Multidetector CT imaging of the head and cervical spine was performed following the standard protocol without intravenous contrast. Multiplanar CT image reconstructions of the cervical spine were also generated. COMPARISON:  12/31/2015 FINDINGS: CT HEAD FINDINGS Brain: No acute intracranial hemorrhage or midline shift. No extra-axial fluid collection. Mild diffuse  atrophy is noted. Subcortical and periventricular white matter hypodensities are noted bilaterally and there is no hydrocephalus. A CSF collection is noted in the posterior fossa on the right resulting in mild mass effect on the right cerebellar hemisphere, possible arachnoid cyst versus postsurgical changes. Vascular: Atherosclerotic calcification of the carotid siphons. No hyperdense vessel. Skull: No acute fracture. Surgical changes are present in the occipital bone on the right Sinuses/Orbits: Mild mucosal thickening in the left maxillary sinus. Metallic density is noted in the right orbit. Other: There is a scalp hematoma with foci of air over the parietal bone on the right. CT CERVICAL SPINE FINDINGS Alignment: There is mild anterolisthesis C7-T1. Skull base and vertebrae: No acute fracture. No primary bone lesion or focal pathologic process. There is fusion of the C5-C6 vertebral bodies. Soft tissues and spinal canal: No prevertebral fluid or swelling. No visible canal hematoma. Disc levels: Multilevel intervertebral disc space narrowing, osteophyte formation and facet arthropathy resulting in mild-to-moderate spinal canal and moderate to severe neural foraminal stenosis at multiple levels. Upper chest: Negative. Other: Atherosclerotic calcification of the carotid bulbs. IMPRESSION: 1. No acute intracranial hemorrhage. 2. Scalp contusion containing air over the parietal bone on the right. 3. Atrophy with chronic  microvascular ischemic changes. 4. Postsurgical changes in the occipital region on the right. 5. Multilevel degenerative changes in the cervical spine without evidence of acute fracture. Electronically Signed   By: Brett Fairy M.D.   On: 06/10/2021 00:50   CT Cervical Spine Wo Contrast  Result Date: 06/10/2021 CLINICAL DATA:  Neck trauma, fall. EXAM: CT HEAD WITHOUT CONTRAST CT CERVICAL SPINE WITHOUT CONTRAST TECHNIQUE: Multidetector CT imaging of the head and cervical spine was performed  following the standard protocol without intravenous contrast. Multiplanar CT image reconstructions of the cervical spine were also generated. COMPARISON:  12/31/2015 FINDINGS: CT HEAD FINDINGS Brain: No acute intracranial hemorrhage or midline shift. No extra-axial fluid collection. Mild diffuse atrophy is noted. Subcortical and periventricular white matter hypodensities are noted bilaterally and there is no hydrocephalus. A CSF collection is noted in the posterior fossa on the right resulting in mild mass effect on the right cerebellar hemisphere, possible arachnoid cyst versus postsurgical changes. Vascular: Atherosclerotic calcification of the carotid siphons. No hyperdense vessel. Skull: No acute fracture. Surgical changes are present in the occipital bone on the right Sinuses/Orbits: Mild mucosal thickening in the left maxillary sinus. Metallic density is noted in the right orbit. Other: There is a scalp hematoma with foci of air over the parietal bone on the right. CT CERVICAL SPINE FINDINGS Alignment: There is mild anterolisthesis C7-T1. Skull base and vertebrae: No acute fracture. No primary bone lesion or focal pathologic process. There is fusion of the C5-C6 vertebral bodies. Soft tissues and spinal canal: No prevertebral fluid or swelling. No visible canal hematoma. Disc levels: Multilevel intervertebral disc space narrowing, osteophyte formation and facet arthropathy resulting in mild-to-moderate spinal canal and moderate to severe neural foraminal stenosis at multiple levels. Upper chest: Negative. Other: Atherosclerotic calcification of the carotid bulbs. IMPRESSION: 1. No acute intracranial hemorrhage. 2. Scalp contusion containing air over the parietal bone on the right. 3. Atrophy with chronic microvascular ischemic changes. 4. Postsurgical changes in the occipital region on the right. 5. Multilevel degenerative changes in the cervical spine without evidence of acute fracture. Electronically Signed    By: Brett Fairy M.D.   On: 06/10/2021 00:50   US Renal  Result Date: 06/10/2021 CLINICAL DATA:  Renal failure. EXAM: RENAL / URINARY TRACT ULTRASOUND COMPLETE COMPARISON:  03/08/2021. FINDINGS: Right Kidney: Renal measurements: 11.3 x 6.7 x 6.1 = volume: 242.8 mL. Increased echogenicity. No renal calculus. Moderate hydronephrosis. Left Kidney: Renal measurements: 10.8 x 6.2 x 5.3 = volume: 184.7 mL. Increased echogenicity. No renal calculus. Moderate hydronephrosis. Bladder: The urinary bladder is markedly distended. Prevoid bladder volume 3036 mL. Patient unable to void for postvoid bladder volume. Other: The prostate gland is enlarged measuring 6.1 x 6.3 x 5.0 cm. IMPRESSION: 1. Markedly distended urinary bladder with volume of 3036 mL. Patient was unable to void for examination, concerning for bladder outlet obstruction. 2. Moderate bilateral hydronephrosis. 3. Increased renal echogenicity bilaterally, compatible with medical renal disease. 4. Enlarged prostate gland. Electronically Signed   By: Brett Fairy M.D.   On: 06/10/2021 02:38   VAS [place] LOWER EXTREMITY VENOUS (DVT)  Result Date: 06/10/2021  Lower Venous DVT Study Patient Name:  Samuel Fisher  Date of Exam:   06/10/2021 Medical Rec #: [id number]           Accession #:    [id number] Date of Birth: [birth year]           Patient Gender: M Patient Age:   79 years Exam Location:  [facility] Procedure:      VAS [place] LOWER EXTREMITY VENOUS (DVT) Referring Phys: ERIC CHEN --------------------------------------------------------------------------------  Indications: Edema.  Comparison Study: No prior study Performing Technologist: Maudry Mayhew MHA, RDMS, RVT, RDCS  Examination Guidelines: A complete evaluation includes B-mode imaging, spectral Doppler, color Doppler, and power Doppler as needed of all accessible portions of each vessel. Bilateral testing is considered an integral part of a complete examination. Limited examinations for  reoccurring indications may be performed as noted. The reflux portion of the exam is performed with the patient in reverse Trendelenburg.  +-----+---------------+---------+-----------+----------+--------------+  RIGHT Compressibility Phasicity Spontaneity Properties Thrombus Aging  +-----+---------------+---------+-----------+----------+--------------+  CFV   Full            Yes       Yes                                    +-----+---------------+---------+-----------+----------+--------------+   +---------+---------------+---------+-----------+----------+--------------+  LEFT      Compressibility Phasicity Spontaneity Properties Thrombus Aging  +---------+---------------+---------+-----------+----------+--------------+  CFV       Full            Yes       Yes                                    +---------+---------------+---------+-----------+----------+--------------+  SFJ       Full                                                             +---------+---------------+---------+-----------+----------+--------------+  FV Prox   Full                                                             +---------+---------------+---------+-----------+----------+--------------+  FV Mid    Full                                                             +---------+---------------+---------+-----------+----------+--------------+  FV Distal Full                                                             +---------+---------------+---------+-----------+----------+--------------+  PFV       Full                                                             +---------+---------------+---------+-----------+----------+--------------+  POP       Full  Yes       Yes                                    +---------+---------------+---------+-----------+----------+--------------+  PTV       Full                                                             +---------+---------------+---------+-----------+----------+--------------+   PERO      None                      No                     Acute           +---------+---------------+---------+-----------+----------+--------------+     Summary: RIGHT: - No evidence of common femoral vein obstruction.  LEFT: - Findings consistent with acute deep vein thrombosis involving the left peroneal veins. - No cystic structure found in the popliteal fossa.  *See table(s) above for measurements and observations.    Preliminary     I independently reviewed the above imaging studies.  Impression/Recommendation Urinary retention: Presented with over 3 L in his bladder.  Renal ultrasound 06/10/2021 with markedly distended bladder and moderate bilateral hydronephrosis. Foley catheter was initially placed with return of clear yellow urine 3 L. Moderate bilateral hydronephrosis: She underwent ultrasound 06/10/2021 in the setting of bladder outlet obstruction. Gross hematuria: This was seen several hours after initial Foley catheter placement.  This could be due to distended bladder or catheter trauma.  He had no prior gross hematuria history.  This is clearing. AKI: Creatinine 3.1 from baseline of 1 upon presentation.  Improving. BPH/LUTS: At baseline, he has mild urinary tract symptoms.  He was currently not on an alpha-blocker or finasteride outpatient. Elevated PSA: He follows with Dr. Louis Meckel with history of an elevated PSA.  Last PSA in 12/2020 was elevated 8.44.  He has 2 prior negative TRUS biopsies.  -Leave Foley catheter to gravity drainage for at least 5 days given significant bladder distention. -If evidence of decreased drainage or clots, initiate manual irrigation. -Started tamsulosin 0.4 mg and finasteride 5 mg -Check urine culture. -Plan to recheck renal ultrasound 48 hours after catheter placement to ensure resolution of bilateral hydronephrosis. -Trend creatinine.  Improving. -He is due to follow with Dr. Louis Meckel outpatient in January 2023 for history of an elevated PSA.  He has  2 prior negative biopsies. -Following peripherally.  Please call questions.  Matt R. Khaden Gater MD 06/10/2021, 4:19 PM  [facility]  Pager: [phone]   CC: Shawna Clamp, MD

## 2021-06-10 NOTE — Progress Notes (Signed)
Abd much less distended. Now soft. RN reports >4000 ml was in his bladder.

## 2021-06-10 NOTE — ED Provider Notes (Signed)
[facility] DEPT Provider Note   CSN: [id number] Arrival date & time: 06/09/21  2349     History Chief Complaint  Patient presents with   Laceration    Samuel Fisher is a 75 y.o. male.  Patient is a 75 year old male who presents after a fall with a head injury.  He says he has been drinking 4-5 drinks tonight.  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  When I ask him how often he drinks, he says he drinks a lot when he is with his fraternity brothers.  He had a fall, falling backward and striking his head on the ground.  There is no loss of consciousness.  He denies any other injuries from the fall.  He has a laceration to the posterior scalp area.  He does not know when his last tetanus shot was.  When asking what made him fall, he says "the alcohol".  He also has some balance issues due to a prior brain tumor.  It sounds like he had a prior schwannoma.  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      Past Medical History:  Diagnosis Date   Allergy    Arthritis    BPH (benign prostatic hyperplasia) 2010   Brain tumor ([place])    Hearing problem    Hyperlipidemia    Hypertension    Inner ear dysfunction     Patient Active Problem List   Diagnosis Date Noted   AKI (acute kidney injury) ([place]) 06/10/2021   Acute urinary retention 06/10/2021   Fall at home, initial encounter 32/99/2426   Alcoholic intoxication without complication ([place]) 83/41/9622   Hyperlipidemia LDL goal <70 04/22/2021   Chronic hyperglycemia 01/19/2021   Hypercalcemia due to a drug 03/18/2020   CAD in native artery 06/10/2019   Cystic mass of pancreas 06/06/2019   Chronic hyponatremia 05/28/2019   Vitamin D deficiency 01/15/2018   B12 deficiency anemia 07/15/2016   Benign schwannoma 07/14/2016   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   Osteoarthritis of hip 01/28/2013   Essential hypertension,  benign 06/28/2012   Pure hypercholesterolemia 06/28/2012   BPH (benign prostatic hyperplasia) 06/28/2012   Routine general medical examination at a health care facility 06/28/2012   PSA elevation 06/28/2012    Past Surgical History:  Procedure Laterality Date   BRAIN TUMOR [place]   TOTAL HIP ARTHROPLASTY     right-2002 and left-2004, 2010       Family History  Problem Relation Age of Onset   Uterine cancer Mother    Rheum arthritis Mother    Hypertension Father    Throat cancer Father        smoker   Arthritis Other    Heart disease Other    Hypertension Other    Stroke Other    Early death Other    Mental illness Other    Heart attack Brother        smoker   Heart attack Maternal Grandfather    Hypertension Brother    Stroke Brother    Diabetes Neg Hx    Hyperlipidemia Neg Hx     Social History   Tobacco Use   Smoking status: Former    Types: Cigarettes    Quit date: 06/28/1976    Years since quitting: 44.9   Smokeless tobacco: Never  Vaping  Use   Vaping Use: Never used  Substance Use Topics   Alcohol use: Yes    Alcohol/week: 3.0 standard drinks    Types: 3 Cans of beer per week    Comment: 2-3 beers per day and occasional drink on the weekends   Drug use: No    Home Medications Prior to Admission medications   Medication Sig Start Date End Date Taking? Authorizing Provider  amoxicillin (AMOXIL) 500 MG capsule Take 2,000 mg by mouth once as needed (for dentist appointment).    [provider]  aspirin 325 MG tablet Take 325 mg by mouth every 4 (four) hours as needed for moderate pain or headache.     [provider]  Azilsartan Medoxomil (EDARBI) 80 MG TABS Take 1 tablet (80 mg total) by mouth daily. 01/20/21   [name], MD  Cyanocobalamin (VITAMIN B-12 IJ) Inject 1,000 mcg as directed every 30 (thirty) days.    [provider]  fluticasone (FLONASE) 50 MCG/ACT nasal spray Place  1-2 sprays into both nostrils daily as needed. allergies 12/03/15   [provider]  rosuvastatin (CRESTOR) 5 MG tablet Take 1 tablet (5 mg total) by mouth daily. 04/22/21   [name], MD    Allergies    Bee venom  Review of Systems   Review of Systems  Constitutional:  Negative for chills, diaphoresis, fatigue and fever.  HENT:  Negative for congestion, rhinorrhea and sneezing.   Eyes: Negative.   Respiratory:  Negative for cough, chest tightness and shortness of breath.   Cardiovascular:  Negative for chest pain and leg swelling.  Gastrointestinal:  Negative for abdominal pain, blood in stool, diarrhea, nausea and vomiting.  Genitourinary:  Negative for difficulty urinating, flank pain, frequency and hematuria.  Musculoskeletal:  Negative for arthralgias and back pain.  Skin:  Positive for wound. Negative for rash.  Neurological:  Negative for dizziness, speech difficulty, weakness, numbness and headaches.   Physical Exam Updated Vital Signs BP (!) 153/94    Pulse 85    Temp 97.8 F (36.6 C) (Oral)    Resp 20    Ht 5' 9.5" (1.765 m)    Wt 85.7 kg    SpO2 100%    BMI 27.51 kg/m   Physical Exam Constitutional:      Appearance: He is well-developed.  HENT:     Head: Normocephalic.     Comments: 1 cm laceration of the posterior scalp, no active bleeding Eyes:     Pupils: Pupils are equal, round, and reactive to light.  Neck:     Comments: No pain on palpation of the cervical, thoracic or lumbosacral spine Cardiovascular:     Rate and Rhythm: Normal rate and regular rhythm.     Heart sounds: Normal heart sounds.  Pulmonary:     Effort: Pulmonary effort is normal. No respiratory distress.     Breath sounds: Normal breath sounds. No wheezing or rales.  Chest:     Chest wall: No tenderness.  Abdominal:     General: Bowel sounds are normal.     Palpations: Abdomen is soft.     Tenderness: There is no abdominal tenderness. There is no guarding or rebound.   Musculoskeletal:        General: Normal range of motion.     Comments: No pain on palpation or range of motion extremities  Lymphadenopathy:     Cervical: No cervical adenopathy.  Skin:    General: Skin is warm and dry.  Findings: No rash.  Neurological:     Mental Status: He is alert and oriented to person, place, and time.     Comments: He has drooping of the right side of face which he said is chronic and unchanged, no other focal deficits noted.  Motor 5 out of 5 all extremities, sensation grossly intact to light touch all extremities    ED Results / Procedures / Treatments   Labs (all labs ordered are listed, but only abnormal results are displayed) Labs Reviewed  BASIC METABOLIC PANEL - Abnormal; Notable for the following components:      Result Value   CO2 13 (*)    Glucose, Bld 119 (*)    BUN 41 (*)    Creatinine, Ser 3.19 (*)    Calcium 8.8 (*)    GFR, Estimated 20 (*)    All other components within normal limits  CBC WITH DIFFERENTIAL/PLATELET - Abnormal; Notable for the following components:   RBC 3.12 (*)    Hemoglobin 10.3 (*)    HCT 31.5 (*)    MCV 101.0 (*)    All other components within normal limits  ETHANOL - Abnormal; Notable for the following components:   Alcohol, Ethyl (B) 266 (*)    All other components within normal limits  URINALYSIS, ROUTINE W REFLEX MICROSCOPIC - Abnormal; Notable for the following components:   Color, Urine STRAW (*)    Hgb urine dipstick SMALL (*)    All other components within normal limits  RESP PANEL BY RT-PCR (FLU A&B, COVID) [id number]    EKG None  Radiology CT Head Wo Contrast  Result Date: 06/10/2021 CLINICAL DATA:  Neck trauma, fall. EXAM: CT HEAD WITHOUT CONTRAST CT CERVICAL SPINE WITHOUT CONTRAST TECHNIQUE: Multidetector CT imaging of the head and cervical spine was performed following the standard protocol without intravenous contrast. Multiplanar CT image reconstructions of the cervical spine were also  generated. COMPARISON:  12/31/2015 FINDINGS: CT HEAD FINDINGS Brain: No acute intracranial hemorrhage or midline shift. No extra-axial fluid collection. Mild diffuse atrophy is noted. Subcortical and periventricular white matter hypodensities are noted bilaterally and there is no hydrocephalus. A CSF collection is noted in the posterior fossa on the right resulting in mild mass effect on the right cerebellar hemisphere, possible arachnoid cyst versus postsurgical changes. Vascular: Atherosclerotic calcification of the carotid siphons. No hyperdense vessel. Skull: No acute fracture. Surgical changes are present in the occipital bone on the right Sinuses/Orbits: Mild mucosal thickening in the left maxillary sinus. Metallic density is noted in the right orbit. Other: There is a scalp hematoma with foci of air over the parietal bone on the right. CT CERVICAL SPINE FINDINGS Alignment: There is mild anterolisthesis C7-T1. Skull base and vertebrae: No acute fracture. No primary bone lesion or focal pathologic process. There is fusion of the C5-C6 vertebral bodies. Soft tissues and spinal canal: No prevertebral fluid or swelling. No visible canal hematoma. Disc levels: Multilevel intervertebral disc space narrowing, osteophyte formation and facet arthropathy resulting in mild-to-moderate spinal canal and moderate to severe neural foraminal stenosis at multiple levels. Upper chest: Negative. Other: Atherosclerotic calcification of the carotid bulbs. IMPRESSION: 1. No acute intracranial hemorrhage. 2. Scalp contusion containing air over the parietal bone on the right. 3. Atrophy with chronic microvascular ischemic changes. 4. Postsurgical changes in the occipital region on the right. 5. Multilevel degenerative changes in the cervical spine without evidence of acute fracture. Electronically Signed   By: Brett Fairy M.D.   On: 06/10/2021 00:50  CT Cervical Spine Wo Contrast  Result Date: 06/10/2021 CLINICAL DATA:  Neck  trauma, fall. EXAM: CT HEAD WITHOUT CONTRAST CT CERVICAL SPINE WITHOUT CONTRAST TECHNIQUE: Multidetector CT imaging of the head and cervical spine was performed following the standard protocol without intravenous contrast. Multiplanar CT image reconstructions of the cervical spine were also generated. COMPARISON:  12/31/2015 FINDINGS: CT HEAD FINDINGS Brain: No acute intracranial hemorrhage or midline shift. No extra-axial fluid collection. Mild diffuse atrophy is noted. Subcortical and periventricular white matter hypodensities are noted bilaterally and there is no hydrocephalus. A CSF collection is noted in the posterior fossa on the right resulting in mild mass effect on the right cerebellar hemisphere, possible arachnoid cyst versus postsurgical changes. Vascular: Atherosclerotic calcification of the carotid siphons. No hyperdense vessel. Skull: No acute fracture. Surgical changes are present in the occipital bone on the right Sinuses/Orbits: Mild mucosal thickening in the left maxillary sinus. Metallic density is noted in the right orbit. Other: There is a scalp hematoma with foci of air over the parietal bone on the right. CT CERVICAL SPINE FINDINGS Alignment: There is mild anterolisthesis C7-T1. Skull base and vertebrae: No acute fracture. No primary bone lesion or focal pathologic process. There is fusion of the C5-C6 vertebral bodies. Soft tissues and spinal canal: No prevertebral fluid or swelling. No visible canal hematoma. Disc levels: Multilevel intervertebral disc space narrowing, osteophyte formation and facet arthropathy resulting in mild-to-moderate spinal canal and moderate to severe neural foraminal stenosis at multiple levels. Upper chest: Negative. Other: Atherosclerotic calcification of the carotid bulbs. IMPRESSION: 1. No acute intracranial hemorrhage. 2. Scalp contusion containing air over the parietal bone on the right. 3. Atrophy with chronic microvascular ischemic changes. 4. Postsurgical  changes in the occipital region on the right. 5. Multilevel degenerative changes in the cervical spine without evidence of acute fracture. Electronically Signed   By: Brett Fairy M.D.   On: 06/10/2021 00:50   US Renal  Result Date: 06/10/2021 CLINICAL DATA:  Renal failure. EXAM: RENAL / URINARY TRACT ULTRASOUND COMPLETE COMPARISON:  03/08/2021. FINDINGS: Right Kidney: Renal measurements: 11.3 x 6.7 x 6.1 = volume: 242.8 mL. Increased echogenicity. No renal calculus. Moderate hydronephrosis. Left Kidney: Renal measurements: 10.8 x 6.2 x 5.3 = volume: 184.7 mL. Increased echogenicity. No renal calculus. Moderate hydronephrosis. Bladder: The urinary bladder is markedly distended. Prevoid bladder volume 3036 mL. Patient unable to void for postvoid bladder volume. Other: The prostate gland is enlarged measuring 6.1 x 6.3 x 5.0 cm. IMPRESSION: 1. Markedly distended urinary bladder with volume of 3036 mL. Patient was unable to void for examination, concerning for bladder outlet obstruction. 2. Moderate bilateral hydronephrosis. 3. Increased renal echogenicity bilaterally, compatible with medical renal disease. 4. Enlarged prostate gland. Electronically Signed   By: Brett Fairy M.D.   On: 06/10/2021 02:38    Procedures .Marland KitchenLaceration Repair  Date/Time: 06/10/2021 3:07 AM Performed by: Malvin Johns, MD Authorized by: Malvin Johns, MD   Consent:    Consent obtained:  Verbal   Consent given by:  Patient   Risks discussed:  Infection, poor cosmetic result and pain   Alternatives discussed:  No treatment Universal protocol:    Patient identity confirmed:  Verbally with patient Anesthesia:    Anesthesia method:  None Laceration details:    Location:  Scalp   Length (cm):  2 Pre-procedure details:    Preparation:  Patient was prepped and draped in usual sterile fashion and imaging obtained to evaluate for foreign bodies Exploration:    Imaging outcome:  foreign body not noted     Wound  exploration: entire depth of wound visualized     Wound extent: no fascia violation noted, no foreign bodies/material noted, no muscle damage noted, no underlying fracture noted and no vascular damage noted     Contaminated: no   Treatment:    Area cleansed with:  Saline   Amount of cleaning:  Standard   Irrigation solution:  Sterile saline   Irrigation method:  Syringe   Visualized foreign bodies/material removed: no   Skin repair:    Repair method:  Staples   Number of staples:  2 Approximation:    Approximation:  Close Repair type:    Repair type:  Simple Post-procedure details:    Dressing:  Open (no dressing)   Medications Ordered in ED Medications  lidocaine-EPINEPHrine-tetracaine (LET) topical gel (has no administration in time range)  Tdap (BOOSTRIX) injection 0.5 mL (0.5 mLs Intramuscular Given 06/10/21 0049)  sodium chloride 0.9 % bolus 1,000 mL (1,000 mLs Intravenous New Bag/Given 06/10/21 0049)    ED Course  I have reviewed the triage vital signs and the nursing notes.  Pertinent labs & imaging results that were available during my care of the patient were reviewed by me and considered in my medical decision making (see chart for details).    MDM Rules/Calculators/A&P                         Patient is a 75 year old who presents after a fall.  His EtOH level is elevated.  He has a laceration to his scalp.  This was repaired by me in the ED.  CT scan of his head and cervical spine showed no acute abnormalities.  Labs show evidence of acute kidney injury.  Bladder scan was performed which was normal actually however renal ultrasound showed a large amount of retained urine in the bladder, over 3000 cc with hydronephrosis.  Foley catheter was placed.  His urine does not appear to be infected.  I spoke with Dr. Vallarie Mare with the hospitalist service who will admit the patient for further treatment.    Final Clinical Impression(s) / ED Diagnoses Final diagnoses:  Laceration  of scalp, initial encounter  Alcoholic intoxication without complication ([place])  AKI (acute kidney injury) ([place])  Urinary retention    Rx / DC Orders ED Discharge Orders     None        Malvin Johns, MD 06/10/21 [id number]

## 2021-06-10 NOTE — ED Triage Notes (Signed)
Pt to ED via [facility] from home with c/o laceration to posterior scalp s/p fall from standing position.  No loss of conciousness, A+Ox4.  ETOH +

## 2021-06-10 NOTE — Care Plan (Signed)
This 75 years old male with PMH significant for BPH, hypertension, hearing loss presents in the ED s/p fall at home.  Patient reports he and his friends were watching [place] football game.  They had drank in moderation.  Patient does not remember how much he has drank.  Patient reports after he got up from the chair,  he stumbled backwards and  hit his head against the bookshelf,  he cut his scalp.  Patient was brought in the ED,  he was found to have alcohol level of 266.  Bladder scan greater than 1 L in his bladder.  Patient has developed acute urinary retention requiring indwelling Foley catheter.  Renal ultrasound showed bladder outlet obstruction with moderate bilateral hydronephrosis.  Urologist is consulted.  Patient has developed acute kidney injury which is improving with IV hydration.  Patient was seen and examined at bedside.  He is alert and oriented reports feeling better.  He is found to have left leg swelling venous duplex is ordered.

## 2021-06-10 NOTE — Progress Notes (Signed)
Left lower extremity venous duplex completed. Refer to "CV Proc" under chart review to view preliminary results.  Preliminary results discussed with Claiborne Billings, RN.  06/10/2021 3:36 PM Kelby Aline., MHA, RVT, RDCS, RDMS

## 2021-06-11 DIAGNOSIS — Y908 Blood alcohol level of 240 mg/100 ml or more: Secondary | ICD-10-CM | POA: Diagnosis present

## 2021-06-11 DIAGNOSIS — Z20822 Contact with and (suspected) exposure to covid-19: Secondary | ICD-10-CM | POA: Diagnosis present

## 2021-06-11 DIAGNOSIS — Z91030 Bee allergy status: Secondary | ICD-10-CM | POA: Diagnosis not present

## 2021-06-11 DIAGNOSIS — E7800 Pure hypercholesterolemia, unspecified: Secondary | ICD-10-CM | POA: Diagnosis present

## 2021-06-11 DIAGNOSIS — F10120 Alcohol abuse with intoxication, uncomplicated: Secondary | ICD-10-CM | POA: Diagnosis present

## 2021-06-11 DIAGNOSIS — N179 Acute kidney failure, unspecified: Secondary | ICD-10-CM | POA: Diagnosis present

## 2021-06-11 DIAGNOSIS — Z96643 Presence of artificial hip joint, bilateral: Secondary | ICD-10-CM | POA: Diagnosis present

## 2021-06-11 DIAGNOSIS — R339 Retention of urine, unspecified: Secondary | ICD-10-CM | POA: Diagnosis present

## 2021-06-11 DIAGNOSIS — Z79899 Other long term (current) drug therapy: Secondary | ICD-10-CM | POA: Diagnosis not present

## 2021-06-11 DIAGNOSIS — N320 Bladder-neck obstruction: Secondary | ICD-10-CM | POA: Diagnosis present

## 2021-06-11 DIAGNOSIS — I82452 Acute embolism and thrombosis of left peroneal vein: Secondary | ICD-10-CM | POA: Diagnosis present

## 2021-06-11 DIAGNOSIS — R310 Gross hematuria: Secondary | ICD-10-CM | POA: Diagnosis present

## 2021-06-11 DIAGNOSIS — I10 Essential (primary) hypertension: Secondary | ICD-10-CM | POA: Diagnosis present

## 2021-06-11 DIAGNOSIS — H9190 Unspecified hearing loss, unspecified ear: Secondary | ICD-10-CM | POA: Diagnosis present

## 2021-06-11 DIAGNOSIS — N400 Enlarged prostate without lower urinary tract symptoms: Secondary | ICD-10-CM | POA: Diagnosis present

## 2021-06-11 DIAGNOSIS — Z87891 Personal history of nicotine dependence: Secondary | ICD-10-CM | POA: Diagnosis not present

## 2021-06-11 DIAGNOSIS — Z23 Encounter for immunization: Secondary | ICD-10-CM | POA: Diagnosis present

## 2021-06-11 DIAGNOSIS — N1330 Unspecified hydronephrosis: Secondary | ICD-10-CM | POA: Diagnosis present

## 2021-06-11 DIAGNOSIS — S0101XA Laceration without foreign body of scalp, initial encounter: Secondary | ICD-10-CM | POA: Diagnosis present

## 2021-06-11 DIAGNOSIS — Y92009 Unspecified place in unspecified non-institutional (private) residence as the place of occurrence of the external cause: Secondary | ICD-10-CM | POA: Diagnosis not present

## 2021-06-11 DIAGNOSIS — Z83438 Family history of other disorder of lipoprotein metabolism and other lipidemia: Secondary | ICD-10-CM | POA: Diagnosis not present

## 2021-06-11 DIAGNOSIS — W19XXXA Unspecified fall, initial encounter: Secondary | ICD-10-CM | POA: Diagnosis present

## 2021-06-11 DIAGNOSIS — Z7982 Long term (current) use of aspirin: Secondary | ICD-10-CM | POA: Diagnosis not present

## 2021-06-11 DIAGNOSIS — Z8249 Family history of ischemic heart disease and other diseases of the circulatory system: Secondary | ICD-10-CM | POA: Diagnosis not present

## 2021-06-11 LAB — HEPARIN LEVEL (UNFRACTIONATED)
Heparin Unfractionated: 0.46 IU/mL (ref 0.30–0.70)
Heparin Unfractionated: 0.53 IU/mL (ref 0.30–0.70)
Heparin Unfractionated: 0.71 IU/mL — ABNORMAL HIGH (ref 0.30–0.70)

## 2021-06-11 LAB — COMPREHENSIVE METABOLIC PANEL
ALT: 16 U/L (ref 0–44)
AST: 29 U/L (ref 15–41)
Albumin: 3.8 g/dL (ref 3.5–5.0)
Alkaline Phosphatase: 40 U/L (ref 38–126)
Anion gap: 9 (ref 5–15)
BUN: 36 mg/dL — ABNORMAL HIGH (ref 8–23)
CO2: 17 mmol/L — ABNORMAL LOW (ref 22–32)
Calcium: 9.2 mg/dL (ref 8.9–10.3)
Chloride: 113 mmol/L — ABNORMAL HIGH (ref 98–111)
Creatinine, Ser: 2.6 mg/dL — ABNORMAL HIGH (ref 0.61–1.24)
GFR, Estimated: 25 mL/min — ABNORMAL LOW (ref >60)
Glucose, Bld: 121 mg/dL — ABNORMAL HIGH (ref 70–99)
Potassium: 4.1 mmol/L (ref 3.5–5.1)
Sodium: 139 mmol/L (ref 135–145)
Total Bilirubin: 0.8 mg/dL (ref 0.3–1.2)
Total Protein: 6.3 g/dL — ABNORMAL LOW (ref 6.5–8.1)

## 2021-06-11 LAB — CBC WITH DIFFERENTIAL/PLATELET
Abs Immature Granulocytes: 0.03 10*3/uL (ref 0.00–0.07)
Basophils Absolute: 0 10*3/uL (ref 0.0–0.1)
Basophils Relative: 0 %
Eosinophils Absolute: 0 10*3/uL (ref 0.0–0.5)
Eosinophils Relative: 0 %
HCT: 27.1 % — ABNORMAL LOW (ref 39.0–52.0)
Hemoglobin: 9.1 g/dL — ABNORMAL LOW (ref 13.0–17.0)
Immature Granulocytes: 0 %
Lymphocytes Relative: 11 %
Lymphs Abs: 0.8 10*3/uL (ref 0.7–4.0)
MCH: 33.1 pg (ref 26.0–34.0)
MCHC: 33.6 g/dL (ref 30.0–36.0)
MCV: 98.5 fL (ref 80.0–100.0)
Monocytes Absolute: 0.7 10*3/uL (ref 0.1–1.0)
Monocytes Relative: 10 %
Neutro Abs: 5.4 10*3/uL (ref 1.7–7.7)
Neutrophils Relative %: 79 %
Platelets: 174 10*3/uL (ref 150–400)
RBC: 2.75 MIL/uL — ABNORMAL LOW (ref 4.22–5.81)
RDW: 13.2 % (ref 11.5–15.5)
WBC: 6.8 10*3/uL (ref 4.0–10.5)
nRBC: 0 % (ref 0.0–0.2)

## 2021-06-11 LAB — MAGNESIUM: Magnesium: 1.7 mg/dL (ref 1.7–2.4)

## 2021-06-11 LAB — PHOSPHORUS: Phosphorus: 4.4 mg/dL (ref 2.5–4.6)

## 2021-06-11 NOTE — Progress Notes (Signed)
ANTICOAGULATION CONSULT NOTE   Pharmacy Consult for heparin Indication: acute DVT  Allergies  Allergen Reactions   Bee Venom Anaphylaxis    Allergic to Bee Sting -unknown reaction    Patient Measurements: Height: 5' 9.5" (176.5 cm) Weight: 79.2 kg (174 lb 9.7 oz) IBW/kg (Calculated) : 71.85 Heparin Dosing Weight: 86 kg  Vital Signs: Temp: 98 F (36.7 C) (12/23 1323) BP: 143/83 (12/23 1323) Pulse Rate: 108 (12/23 1323)  Labs: Recent Labs    06/10/21 0030 06/10/21 0931 06/11/21 0035 06/11/21 0834 06/11/21 1956  HGB 10.3*  --  9.1*  --   --   HCT 31.5*  --  27.1*  --   --   PLT 172  --  174  --   --   HEPARINUNFRC  --   --  0.53 0.71* 0.46  CREATININE 3.19* 2.85* 2.60*  --   --      Estimated Creatinine Clearance: 25.7 mL/min (A) (by C-G formula based on SCr of 2.6 mg/dL (H)).   Assessment: Patient is a 75 y.o M presented to the ED on 12/21 s/p fall at home with scalp laceration. Head CT showed scalp contusion and was negative for acute intracranial hemorrhage.  He also c/o left LLE swelling with LE doppler on 12/22 came back positive for "acute deep vein thrombosis involving the left  peroneal veins."  Pharmacy has been consulted to dose heparin for VTE treatment.  - patient received heparin 5000 units SQ at 1443 on 12/22  06/11/21 Heparin level therapeutic (0.46) with heparin gtt @ 1200 units/hr No complications of therapy noted Blood in urine noted, however this has been present since foley insertion on 12/22 and has not worsened   Goal of Therapy:  Heparin level 0.3-0.7 units/ml Monitor platelets by anticoagulation protocol: Yes   Plan:  - Continue heparin drip at 1200 units/hr - Daily heparin level and CBC - Monitor for s/sx bleeding  Peggyann Juba, PharmD, BCPS Pharmacy: [phone] 06/11/2021,8:43 PM

## 2021-06-11 NOTE — Progress Notes (Signed)
PROGRESS NOTE    Samuel Fisher  OFB:[id number] DOB: 1946/04/14 DOA: 06/09/2021 PCP: [name], MD    Brief Narrative:  This 75 years old male with PMH significant for BPH, hypertension, hearing loss presents in the ED s/p fall at home. Patient reports he and his friends were watching [place] football game. They had drank in moderation.  Patient does not remember how much he has drank. Patient reports after he got up from the chair,  he stumbled backwards and  hit his head against the bookshelf,  he cut his scalp.  Patient was brought in the ED,  he was found to have alcohol level of 266.  Bladder scan greater than 1 L in his bladder.  Patient has developed acute urinary retention requiring indwelling Foley catheter.  Renal ultrasound showed bladder outlet obstruction with moderate bilateral hydronephrosis. Urologist was consulted, recommended patient can go home with Foley catheter and outpatient follow-up. Patient has developed acute kidney injury which is improving with IV hydration. He is found to have left leg swelling venous duplex positive for DVT,  started on IV heparin.  Assessment & Plan:   Principal Problem:   AKI (acute kidney injury) ([place]) Active Problems:   Essential hypertension, benign   BPH (benign prostatic hyperplasia)   Acute urinary retention   Fall at home, initial encounter   Alcoholic intoxication without complication ([place])   Leg edema, left   Acute kidney injury : > Improving Suspect postrenal, likely sec. to obstructive uropathy due to enlarged prostate. Foley catheter inserted, found to have 3 L of fluid in the bladder. Renal functions improving with bladder decompression.  Urology is consulted, recommended to continue Foley catheter.  Outpatient follow-up Continue IV hydration, renal functions improving 3.19> 2.85> 2.60 Renal ultrasound shows bilateral moderate hydronephrosis with enlarged prostate.  Acute urinary retention: Continue indwelling  Foley catheter, Flomax and Proscar. Urologist recommended outpatient follow-up.  Essential hypertension: Hold losartan due to AKI. Continue hydralazine 25 mg 3 times daily.  BPH: Patient has elevated PSA, follows up with urologist. Continue Proscar and Flomax  Fall: Likely secondary to alcohol intoxication. Patient has a laceration on the posterior scalp. Laceration was stapled by ED PT OT evaluation  Alcohol intoxication: Patient does not drink alcohol every day. He drinks is on a special occasions.  Left leg DVT: Venous ultrasound confirms acute DVT Continue IV heparin, subsequent transition to [place].  DVT prophylaxis: IV heparin Code Status: Full code Family Communication: Spoke with wife  Disposition Plan: Status is: Observation  The patient remains OBS appropriate and will d/c before 2 midnights.  Admitted for AKI secondary to obstructive uropathy requiring indwelling Foley catheter.  Also found to have left DVT requiring IV heparin.  Anticipated discharge home in 1 to 2 days on DOAC.  Consultants:  Urologist  Procedures: Venous duplex Antimicrobials:   Anti-infectives (From admission, onward)    None        Subjective: Patient was seen and examined at bedside.  Overnight events noted.  Patient reports feeling much improved. Renal functions are improving.  Patient is found to have  DVT in the left leg,  now started on IV heparin.  Objective: Vitals:   06/10/21 2117 06/10/21 2124 06/11/21 0500 06/11/21 0504  BP: (!) 155/86 (!) 158/82  (!) 157/84  Pulse: 98 (!) 103  84  Resp:  18    Temp:  98.6 F (37 C)  98.5 F (36.9 C)  TempSrc:    Oral  SpO2:  97%  100%  Weight:   79.2 kg   Height:        Intake/Output Summary (Last 24 hours) at 06/11/2021 1038 Last data filed at 06/11/2021 0857 Gross per 24 hour  Intake 1802.53 ml  Output 3550 ml  Net -1747.47 ml   Filed Weights   06/10/21 0006 06/11/21 0500  Weight: 85.7 kg 79.2 kg     Examination:  General exam: Appears comfortable, not in any acute distress. Respiratory system: Clear to auscultation. Respiratory effort normal.  RR 15 Cardiovascular system: S1-S2 heard, regular rate and rhythm, no murmur. Gastrointestinal system: Abdomen is soft, nontender, nondistended, BS+ Central nervous system: Alert and oriented x 3 . No focal neurological deficits. Extremities: Left leg swelling, tenderness, warmth noted. Skin: No rashes, lesions or ulcers Psychiatry: Judgement and insight appear normal. Mood & affect appropriate.     Data Reviewed: I have personally reviewed following labs and imaging studies  CBC: Recent Labs  Lab 06/10/21 0030 06/11/21 0035  WBC 7.8 6.8  NEUTROABS 6.0 5.4  HGB 10.3* 9.1*  HCT 31.5* 27.1*  MCV 101.0* 98.5  PLT 172 500   Basic Metabolic Panel: Recent Labs  Lab 06/10/21 0030 06/10/21 0931 06/11/21 0035  NA 137 143 139  K 4.2 4.8 4.1  CL 111 113* 113*  CO2 13* 18* 17*  GLUCOSE 119* 111* 121*  BUN 41* 37* 36*  CREATININE 3.19* 2.85* 2.60*  CALCIUM 8.8* 9.6 9.2  MG  --   --  1.7  PHOS  --   --  4.4   GFR: Estimated Creatinine Clearance: 25.7 mL/min (A) (by C-G formula based on SCr of 2.6 mg/dL (H)). Liver Function Tests: Recent Labs  Lab 06/11/21 0035  AST 29  ALT 16  ALKPHOS 40  BILITOT 0.8  PROT 6.3*  ALBUMIN 3.8   No results for input(s): LIPASE, AMYLASE in the last 168 hours. No results for input(s): AMMONIA in the last 168 hours. Coagulation Profile: No results for input(s): INR, PROTIME in the last 168 hours. Cardiac Enzymes: No results for input(s): CKTOTAL, CKMB, CKMBINDEX, TROPONINI in the last 168 hours. BNP (last 3 results) No results for input(s): PROBNP in the last 8760 hours. HbA1C: No results for input(s): HGBA1C in the last 72 hours. CBG: No results for input(s): GLUCAP in the last 168 hours. Lipid Profile: No results for input(s): CHOL, HDL, LDLCALC, TRIG, CHOLHDL, LDLDIRECT in the  last 72 hours. Thyroid Function Tests: No results for input(s): TSH, T4TOTAL, FREET4, T3FREE, THYROIDAB in the last 72 hours. Anemia Panel: No results for input(s): VITAMINB12, FOLATE, FERRITIN, TIBC, IRON, RETICCTPCT in the last 72 hours. Sepsis Labs: No results for input(s): PROCALCITON, LATICACIDVEN in the last 168 hours.  Recent Results (from the past 240 hour(s))  Resp Panel by RT-PCR (Flu A&B, Covid) Nasopharyngeal Swab     Status: None   Collection Time: 06/10/21  3:39 AM   Specimen: Nasopharyngeal Swab; Nasopharyngeal(NP) swabs in vial transport medium  Result Value Ref Range Status   SARS Coronavirus 2 by RT PCR NEGATIVE NEGATIVE Final    Comment: (NOTE) SARS-CoV-2 target nucleic acids are NOT DETECTED.  The SARS-CoV-2 RNA is generally detectable in upper respiratory specimens during the acute phase of infection. The lowest concentration of SARS-CoV-2 viral copies this assay can detect is 138 copies/mL. A negative result does not preclude SARS-Cov-2 infection and should not be used as the sole basis for treatment or other patient management decisions. A negative result may occur with  improper specimen collection/handling,  submission of specimen other than nasopharyngeal swab, presence of viral mutation(s) within the areas targeted by this assay, and inadequate number of viral copies(<138 copies/mL). A negative result must be combined with clinical observations, patient history, and epidemiological information. The expected result is Negative.  Fact Sheet for Patients:  EntrepreneurPulse.com.au  Fact Sheet for Healthcare Providers:  IncredibleEmployment.be  This test is no t yet approved or cleared by the [place] FDA and  has been authorized for detection and/or diagnosis of SARS-CoV-2 by FDA under an Emergency Use Authorization (EUA). This EUA will remain  in effect (meaning this test can be used) for the duration of  the COVID-19 declaration under Section 564(b)(1) of the Act, 21 U.S.C.section 360bbb-3(b)(1), unless the authorization is terminated  or revoked sooner.       Influenza A by PCR NEGATIVE NEGATIVE Final   Influenza B by PCR NEGATIVE NEGATIVE Final    Comment: (NOTE) The Xpert Xpress SARS-CoV-2/FLU/RSV plus assay is intended as an aid in the diagnosis of influenza from Nasopharyngeal swab specimens and should not be used as a sole basis for treatment. Nasal washings and aspirates are unacceptable for Xpert Xpress SARS-CoV-2/FLU/RSV testing.  Fact Sheet for Patients: EntrepreneurPulse.com.au  Fact Sheet for Healthcare Providers: IncredibleEmployment.be  This test is not yet approved or cleared by the [place] FDA and has been authorized for detection and/or diagnosis of SARS-CoV-2 by FDA under an Emergency Use Authorization (EUA). This EUA will remain in effect (meaning this test can be used) for the duration of the COVID-19 declaration under Section 564(b)(1) of the Act, 21 U.S.C. section 360bbb-3(b)(1), unless the authorization is terminated or revoked.  Performed at [facility], [place] [address] [place], [place] [address]     Radiology Studies: CT Head Wo Contrast  Result Date: 06/10/2021 CLINICAL DATA:  Neck trauma, fall. EXAM: CT HEAD WITHOUT CONTRAST CT CERVICAL SPINE WITHOUT CONTRAST TECHNIQUE: Multidetector CT imaging of the head and cervical spine was performed following the standard protocol without intravenous contrast. Multiplanar CT image reconstructions of the cervical spine were also generated. COMPARISON:  12/31/2015 FINDINGS: CT HEAD FINDINGS Brain: No acute intracranial hemorrhage or midline shift. No extra-axial fluid collection. Mild diffuse atrophy is noted. Subcortical and periventricular white matter hypodensities are noted bilaterally and there is no hydrocephalus. A CSF collection is noted in the  posterior fossa on the right resulting in mild mass effect on the right cerebellar hemisphere, possible arachnoid cyst versus postsurgical changes. Vascular: Atherosclerotic calcification of the carotid siphons. No hyperdense vessel. Skull: No acute fracture. Surgical changes are present in the occipital bone on the right Sinuses/Orbits: Mild mucosal thickening in the left maxillary sinus. Metallic density is noted in the right orbit. Other: There is a scalp hematoma with foci of air over the parietal bone on the right. CT CERVICAL SPINE FINDINGS Alignment: There is mild anterolisthesis C7-T1. Skull base and vertebrae: No acute fracture. No primary bone lesion or focal pathologic process. There is fusion of the C5-C6 vertebral bodies. Soft tissues and spinal canal: No prevertebral fluid or swelling. No visible canal hematoma. Disc levels: Multilevel intervertebral disc space narrowing, osteophyte formation and facet arthropathy resulting in mild-to-moderate spinal canal and moderate to severe neural foraminal stenosis at multiple levels. Upper chest: Negative. Other: Atherosclerotic calcification of the carotid bulbs. IMPRESSION: 1. No acute intracranial hemorrhage. 2. Scalp contusion containing air over the parietal bone on the right. 3. Atrophy with chronic microvascular ischemic changes. 4. Postsurgical changes in the occipital region on the  right. 5. Multilevel degenerative changes in the cervical spine without evidence of acute fracture. Electronically Signed   By: Brett Fairy M.D.   On: 06/10/2021 00:50   CT Cervical Spine Wo Contrast  Result Date: 06/10/2021 CLINICAL DATA:  Neck trauma, fall. EXAM: CT HEAD WITHOUT CONTRAST CT CERVICAL SPINE WITHOUT CONTRAST TECHNIQUE: Multidetector CT imaging of the head and cervical spine was performed following the standard protocol without intravenous contrast. Multiplanar CT image reconstructions of the cervical spine were also generated. COMPARISON:  12/31/2015  FINDINGS: CT HEAD FINDINGS Brain: No acute intracranial hemorrhage or midline shift. No extra-axial fluid collection. Mild diffuse atrophy is noted. Subcortical and periventricular white matter hypodensities are noted bilaterally and there is no hydrocephalus. A CSF collection is noted in the posterior fossa on the right resulting in mild mass effect on the right cerebellar hemisphere, possible arachnoid cyst versus postsurgical changes. Vascular: Atherosclerotic calcification of the carotid siphons. No hyperdense vessel. Skull: No acute fracture. Surgical changes are present in the occipital bone on the right Sinuses/Orbits: Mild mucosal thickening in the left maxillary sinus. Metallic density is noted in the right orbit. Other: There is a scalp hematoma with foci of air over the parietal bone on the right. CT CERVICAL SPINE FINDINGS Alignment: There is mild anterolisthesis C7-T1. Skull base and vertebrae: No acute fracture. No primary bone lesion or focal pathologic process. There is fusion of the C5-C6 vertebral bodies. Soft tissues and spinal canal: No prevertebral fluid or swelling. No visible canal hematoma. Disc levels: Multilevel intervertebral disc space narrowing, osteophyte formation and facet arthropathy resulting in mild-to-moderate spinal canal and moderate to severe neural foraminal stenosis at multiple levels. Upper chest: Negative. Other: Atherosclerotic calcification of the carotid bulbs. IMPRESSION: 1. No acute intracranial hemorrhage. 2. Scalp contusion containing air over the parietal bone on the right. 3. Atrophy with chronic microvascular ischemic changes. 4. Postsurgical changes in the occipital region on the right. 5. Multilevel degenerative changes in the cervical spine without evidence of acute fracture. Electronically Signed   By: Brett Fairy M.D.   On: 06/10/2021 00:50   US Renal  Result Date: 06/10/2021 CLINICAL DATA:  Renal failure. EXAM: RENAL / URINARY TRACT ULTRASOUND  COMPLETE COMPARISON:  03/08/2021. FINDINGS: Right Kidney: Renal measurements: 11.3 x 6.7 x 6.1 = volume: 242.8 mL. Increased echogenicity. No renal calculus. Moderate hydronephrosis. Left Kidney: Renal measurements: 10.8 x 6.2 x 5.3 = volume: 184.7 mL. Increased echogenicity. No renal calculus. Moderate hydronephrosis. Bladder: The urinary bladder is markedly distended. Prevoid bladder volume 3036 mL. Patient unable to void for postvoid bladder volume. Other: The prostate gland is enlarged measuring 6.1 x 6.3 x 5.0 cm. IMPRESSION: 1. Markedly distended urinary bladder with volume of 3036 mL. Patient was unable to void for examination, concerning for bladder outlet obstruction. 2. Moderate bilateral hydronephrosis. 3. Increased renal echogenicity bilaterally, compatible with medical renal disease. 4. Enlarged prostate gland. Electronically Signed   By: Brett Fairy M.D.   On: 06/10/2021 02:38   VAS [place] LOWER EXTREMITY VENOUS (DVT)  Result Date: 06/10/2021  Lower Venous DVT Study Patient Name:  Samuel Fisher  Date of Exam:   06/10/2021 Medical Rec #: [id number]           Accession #:    [id number] Date of Birth: 07-Jan-1946           Patient Gender: M Patient Age:   68 years Exam [facility]:  [facility] Procedure:      VAS [place] LOWER EXTREMITY VENOUS (  DVT) Referring Phys: ERIC CHEN --------------------------------------------------------------------------------  Indications: Edema.  Comparison Study: No prior study Performing Technologist: Maudry Mayhew MHA, RDMS, RVT, RDCS  Examination Guidelines: A complete evaluation includes B-mode imaging, spectral Doppler, color Doppler, and power Doppler as needed of all accessible portions of each vessel. Bilateral testing is considered an integral part of a complete examination. Limited examinations for reoccurring indications may be performed as noted. The reflux portion of the exam is performed with the patient in reverse Trendelenburg.   +-----+---------------+---------+-----------+----------+--------------+  RIGHT Compressibility Phasicity Spontaneity Properties Thrombus Aging  +-----+---------------+---------+-----------+----------+--------------+  CFV   Full            Yes       Yes                                    +-----+---------------+---------+-----------+----------+--------------+   +---------+---------------+---------+-----------+----------+--------------+  LEFT      Compressibility Phasicity Spontaneity Properties Thrombus Aging  +---------+---------------+---------+-----------+----------+--------------+  CFV       Full            Yes       Yes                                    +---------+---------------+---------+-----------+----------+--------------+  SFJ       Full                                                             +---------+---------------+---------+-----------+----------+--------------+  FV Prox   Full                                                             +---------+---------------+---------+-----------+----------+--------------+  FV Mid    Full                                                             +---------+---------------+---------+-----------+----------+--------------+  FV Distal Full                                                             +---------+---------------+---------+-----------+----------+--------------+  PFV       Full                                                             +---------+---------------+---------+-----------+----------+--------------+  POP       Full            Yes  Yes                                    +---------+---------------+---------+-----------+----------+--------------+  PTV       Full                                                             +---------+---------------+---------+-----------+----------+--------------+  PERO      None                      No                     Acute            +---------+---------------+---------+-----------+----------+--------------+     Summary: RIGHT: - No evidence of common femoral vein obstruction.  LEFT: - Findings consistent with acute deep vein thrombosis involving the left peroneal veins. - No cystic structure found in the popliteal fossa.  *See table(s) above for measurements and observations.    Preliminary     Scheduled Meds:  Chlorhexidine Gluconate Cloth  6 each Topical Daily   finasteride  5 mg Oral Daily   hydrALAZINE  25 mg Oral Q8H   tamsulosin  0.4 mg Oral Daily   Continuous Infusions:  sodium chloride 100 mL/hr at 06/11/21 0334   heparin 1,300 Units/hr (06/10/21 1739)     LOS: 0 days    Time spent: 35 mins    Madissen Wyse, MD [facility]   If 7PM-7AM, please contact night-coverage

## 2021-06-11 NOTE — Evaluation (Signed)
Occupational Therapy Evaluation Patient Details Name: Samuel Fisher MRN: [id number] DOB: [birth year] Today's Date: 06/11/2021   History of Present Illness 75yo male who presented on 12/21 after a fall at home. Admitted with AKI, urinary retention, and w/u after fall. PMH brain tumor s/p excision, HOH, HLD, HTN, inner ear dysfunction, THA   Clinical Impression   Samuel Fisher rrow is a 75 year old man who demonstrates ability to don socks, perform toileting and toilet transfer and the physical abilities to perform all ADLs. He does need a grab bar or hand hold for sit to stand from low seat. Patient ambulated in room without a device using intermittent hand holds with one loss of balance that he corrected when turning. Overall patient is near his baseline and has assistance of spouse at home. Discussed home environment and how he was going to perform all tasks safely. Patient has plans to return to gym at discharge. No further OT needs at this time.     Recommendations for follow up therapy are one component of a multi-disciplinary discharge planning process, led by the attending physician.  Recommendations may be updated based on patient status, additional functional criteria and insurance authorization.   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      Balance Overall balance assessment: Mild deficits observed, not formally tested Sitting-balance support: Feet supported;No upper extremity supported Sitting balance-Leahy Scale: Normal     Standing balance support: Bilateral upper extremity supported;During functional activity Standing balance-Leahy Scale: Good Standing balance comment: per RN, often reaches out for support when not using RW                           ADL either performed or assessed with clinical judgement   ADL Overall ADL's : Modified independent                                       General ADL Comments: requires grab bars for toileting and assistance for catheter bag - otherwise can perform all tasks.     Vision Patient Visual Report: No change from baseline       Perception     Praxis      Pertinent Vitals/Pain Pain Assessment: No/denies pain Pain Score: 3  Pain Location: generalized Pain Descriptors / Indicators: Discomfort Pain Intervention(s): Monitored during session  Hand Dominance Right   Extremity/Trunk Assessment Upper Extremity Assessment Upper Extremity Assessment: Overall WFL for tasks assessed   Lower Extremity Assessment Lower Extremity Assessment: Defer to PT evaluation   Cervical / Trunk Assessment Cervical / Trunk Assessment: Kyphotic   Communication Communication Communication: No difficulties   Cognition Arousal/Alertness: Awake/alert Behavior During Therapy: WFL for tasks assessed/performed Overall Cognitive Status: Within Functional Limits for tasks assessed                                  General Comments: slightly impulsive, very talkative but easily redirected     General Comments       Exercises     Shoulder Instructions      Home Living Family/patient expects to be discharged to:: Private residence Living Arrangements: Spouse/significant other Available Help at Discharge: Family;Available 24 hours/day Type of Home: House Home Access: Stairs to enter [employer] of Steps: 1 no ste but could hold onto door frame   Home Layout: Two level;Able to live on main level with bedroom/bathroom     Bathroom Shower/Tub: Hospital doctor Toilet: Handicapped height     Home Equipment: Conservation officer, nature (2 wheels);Cane - single point   Additional Comments: has equipment from hip replacements at [place]      Prior Functioning/Environment Prior Level of Function : Independent/Modified Independent             Mobility Comments: exercises 2-3x/week at the gym; usually  does real well          OT Problem List:        OT Treatment/Interventions:      OT Goals(Current goals can be found in the care plan section) Acute Rehab OT Goals OT Goal Formulation: All assessment and education complete, DC therapy  OT Frequency:     Barriers to D/C:            Co-evaluation              AM-PAC OT "6 Clicks" Daily Activity     Outcome Measure Help from another person eating meals?: None Help from another person taking care of personal grooming?: None Help from another person toileting, which includes using toliet, bedpan, or urinal?: None Help from another person bathing (including washing, rinsing, drying)?: None Help from another person to put on and taking off regular upper body clothing?: None Help from another person to put on and taking off regular lower body clothing?: None 6 Click Score: 24   End of Session Nurse Communication: Mobility status  Activity Tolerance: Patient tolerated treatment  well Patient left: in bed;with call bell/phone within reach;with family/visitor present  OT Visit Diagnosis: Unsteadiness on feet (R26.81)                Time: 8466-5993 OT Time Calculation (min): 10 min Charges:  OT General Charges $OT Visit: 1 Visit OT Evaluation $OT Eval Low Complexity: 1 Low  Heaven Wandell, OTR/L [place]  Office [phone] Pager: [phone]   Lenward Chancellor 06/11/2021, 3:28 PM

## 2021-06-11 NOTE — Progress Notes (Signed)
[place] for heparin Indication: acute DVT  Allergies  Allergen Reactions   Bee Venom Anaphylaxis    Allergic to Bee Sting -unknown reaction    Patient Measurements: Height: 5' 9.5" (176.5 cm) Weight: 85.7 kg (189 lb) IBW/kg (Calculated) : 71.85 Heparin Dosing Weight: 86 kg  Vital Signs: Temp: 98.6 F (37 C) (12/22 2124) Temp Source: Oral (12/22 1753) BP: 158/82 (12/22 2124) Pulse Rate: 103 (12/22 2124)  Labs: Recent Labs    06/10/21 0030 06/10/21 0931 06/11/21 0035  HGB 10.3*  --  9.1*  HCT 31.5*  --  27.1*  PLT 172  --  174  HEPARINUNFRC  --   --  0.53  CREATININE 3.19* 2.85* 2.60*     Estimated Creatinine Clearance: 25.7 mL/min (A) (by C-G formula based on SCr of 2.6 mg/dL (H)).   Assessment: Patient is a 75 y.o M presented to the ED on 12/21 s/p fall at home with scalp laceration. Head CT showed scalp contusion and was negative for acute intracranial hemorrhage.  He also c/o left LLE swelling with LE doppler on 12/22 came back positive for "acute deep vein thrombosis involving the left  peroneal veins."  Pharmacy has been consulted to dose heparin for VTE treatment.  - patient received heparin 5000 units SQ at 1443 on 12/22  06/11/21 Heparin level = 0.53 (therapeutic) with heparin gtt @ 1300 units/hr Hgb = 9.1  Pltc 446X No complications of therapy noted  Goal of Therapy:  Heparin level 0.3-0.7 units/ml Monitor platelets by anticoagulation protocol: Yes   Plan:  - Continue heparin drip at 1300 units/hr  - Recheck 8 hr heparin level to confirm therapeutic dose - monitor for s/sx bleeding  Keltie Labell, Toribio Harbour, PharmD 06/11/2021,1:57 AM

## 2021-06-11 NOTE — Progress Notes (Signed)
Urology Inpatient Progress Report  Urinary retention [R33.9] AKI (acute kidney injury) ([place]) [N17.9] Laceration of scalp, initial encounter [T73.22GU] Alcoholic intoxication without complication ([place]) [R42.706]   Intv/Subj: [name] is without complaint.  Principal Problem:   AKI (acute kidney injury) ([place]) Active Problems:   Essential hypertension, benign   BPH (benign prostatic hyperplasia)   Acute urinary retention   Fall at home, initial encounter   Alcoholic intoxication without complication ([place])   Leg edema, left  Current Facility-Administered Medications  Medication Dose Route Frequency Provider Last Rate Last Admin   0.45 % sodium chloride infusion   Intravenous Continuous Shawna Clamp, MD 100 mL/hr at 06/11/21 0334 New Bag at 06/11/21 0334   acetaminophen (TYLENOL) tablet 650 mg  650 mg Oral Q6H PRN Kristopher Oppenheim, DO   650 mg at 06/10/21 1443   Or   acetaminophen (TYLENOL) suppository 650 mg  650 mg Rectal Q6H PRN Kristopher Oppenheim, DO       Chlorhexidine Gluconate Cloth 2 % PADS 6 each  6 each Topical Daily Shawna Clamp, MD   6 each at 06/11/21 0927   finasteride (PROSCAR) tablet 5 mg  5 mg Oral Daily Kristopher Oppenheim, DO   5 mg at 06/11/21 2376   heparin ADULT infusion 100 units/mL (25000 units/276mL)  1,200 Units/hr Intravenous Continuous Dimple Nanas, RPH 12 mL/hr at 06/11/21 1150 1,200 Units/hr at 06/11/21 1150   hydrALAZINE (APRESOLINE) tablet 25 mg  25 mg Oral Q8H Shawna Clamp, MD   25 mg at 06/11/21 1341   ondansetron (ZOFRAN) tablet 4 mg  4 mg Oral Q6H PRN Kristopher Oppenheim, DO       Or   ondansetron [facility]) injection 4 mg  4 mg Intravenous Q6H PRN Kristopher Oppenheim, DO       oxyCODONE (Oxy IR/ROXICODONE) immediate release tablet 5 mg  5 mg Oral Q6H PRN Shawna Clamp, MD       tamsulosin [facility]) capsule 0.4 mg  0.4 mg Oral Daily Kristopher Oppenheim, DO   0.4 mg at 06/11/21 0927     Objective: Vital: Vitals:   06/10/21 2124 06/11/21 0500 06/11/21 0504  06/11/21 1323  BP: (!) 158/82  (!) 157/84 (!) 143/83  Pulse: (!) 103  84 (!) 108  Resp: 18   18  Temp: 98.6 F (37 C)  98.5 F (36.9 C) 98 F (36.7 C)  TempSrc:   Oral   SpO2: 97%  100% 99%  Weight:  79.2 kg    Height:       I/Os: I/O last 3 completed shifts: In: 2951.2 [P.O.:120; I.V.:1831.2; IV Piggyback:1000] Out: 10150 [Urine:10150]  Physical Exam:  General: Patient is in no apparent distress Lungs: Normal respiratory effort, chest expands symmetrically. GI: The abdomen is soft and nontender without mass. Foley: Blood-tinged urine  Ext: lower extremities symmetric  Lab Results: Recent Labs    06/10/21 0030 06/11/21 0035  WBC 7.8 6.8  HGB 10.3* 9.1*  HCT 31.5* 27.1*   Recent Labs    06/10/21 0030 06/10/21 0931 06/11/21 0035  NA 137 143 139  K 4.2 4.8 4.1  CL 111 113* 113*  CO2 13* 18* 17*  GLUCOSE 119* 111* 121*  BUN 41* 37* 36*  CREATININE 3.19* 2.85* 2.60*  CALCIUM 8.8* 9.6 9.2   No results for input(s): LABPT, INR in the last 72 hours. No results for input(s): LABURIN in the last 72 hours. Results for orders placed or performed during the hospital encounter of 06/09/21  Resp Panel  by RT-PCR (Flu A&B, Covid) Nasopharyngeal Swab     Status: None   Collection Time: 06/10/21  3:39 AM   Specimen: Nasopharyngeal Swab; Nasopharyngeal(NP) swabs in vial transport medium  Result Value Ref Range Status   SARS Coronavirus 2 by RT PCR NEGATIVE NEGATIVE Final    Comment: (NOTE) SARS-CoV-2 target nucleic acids are NOT DETECTED.  The SARS-CoV-2 RNA is generally detectable in upper respiratory specimens during the acute phase of infection. The lowest concentration of SARS-CoV-2 viral copies this assay can detect is 138 copies/mL. A negative result does not preclude SARS-Cov-2 infection and should not be used as the sole basis for treatment or other patient management decisions. A negative result may occur with  improper specimen collection/handling, submission of  specimen other than nasopharyngeal swab, presence of viral mutation(s) within the areas targeted by this assay, and inadequate number of viral copies(<138 copies/mL). A negative result must be combined with clinical observations, patient history, and epidemiological information. The expected result is Negative.  Fact Sheet for Patients:  EntrepreneurPulse.com.au  Fact Sheet for Healthcare Providers:  IncredibleEmployment.be  This test is no t yet approved or cleared by the [place] FDA and  has been authorized for detection and/or diagnosis of SARS-CoV-2 by FDA under an Emergency Use Authorization (EUA). This EUA will remain  in effect (meaning this test can be used) for the duration of the COVID-19 declaration under Section 564(b)(1) of the Act, 21 U.S.C.section 360bbb-3(b)(1), unless the authorization is terminated  or revoked sooner.       Influenza A by PCR NEGATIVE NEGATIVE Final   Influenza B by PCR NEGATIVE NEGATIVE Final    Comment: (NOTE) The Xpert Xpress SARS-CoV-2/FLU/RSV plus assay is intended as an aid in the diagnosis of influenza from Nasopharyngeal swab specimens and should not be used as a sole basis for treatment. Nasal washings and aspirates are unacceptable for Xpert Xpress SARS-CoV-2/FLU/RSV testing.  Fact Sheet for Patients: EntrepreneurPulse.com.au  Fact Sheet for Healthcare Providers: IncredibleEmployment.be  This test is not yet approved or cleared by the [place] FDA and has been authorized for detection and/or diagnosis of SARS-CoV-2 by FDA under an Emergency Use Authorization (EUA). This EUA will remain in effect (meaning this test can be used) for the duration of the COVID-19 declaration under Section 564(b)(1) of the Act, 21 U.S.C. section 360bbb-3(b)(1), unless the authorization is terminated or revoked.  Performed at [facility], [place]  [address], [place], [place] [address]     Studies/Results: CT Head Wo Contrast  Result Date: 06/10/2021 CLINICAL DATA:  Neck trauma, fall. EXAM: CT HEAD WITHOUT CONTRAST CT CERVICAL SPINE WITHOUT CONTRAST TECHNIQUE: Multidetector CT imaging of the head and cervical spine was performed following the standard protocol without intravenous contrast. Multiplanar CT image reconstructions of the cervical spine were also generated. COMPARISON:  12/31/2015 FINDINGS: CT HEAD FINDINGS Brain: No acute intracranial hemorrhage or midline shift. No extra-axial fluid collection. Mild diffuse atrophy is noted. Subcortical and periventricular white matter hypodensities are noted bilaterally and there is no hydrocephalus. A CSF collection is noted in the posterior fossa on the right resulting in mild mass effect on the right cerebellar hemisphere, possible arachnoid cyst versus postsurgical changes. Vascular: Atherosclerotic calcification of the carotid siphons. No hyperdense vessel. Skull: No acute fracture. Surgical changes are present in the occipital bone on the right Sinuses/Orbits: Mild mucosal thickening in the left maxillary sinus. Metallic density is noted in the right orbit. Other: There is a scalp hematoma with foci of air  over the parietal bone on the right. CT CERVICAL SPINE FINDINGS Alignment: There is mild anterolisthesis C7-T1. Skull base and vertebrae: No acute fracture. No primary bone lesion or focal pathologic process. There is fusion of the C5-C6 vertebral bodies. Soft tissues and spinal canal: No prevertebral fluid or swelling. No visible canal hematoma. Disc levels: Multilevel intervertebral disc space narrowing, osteophyte formation and facet arthropathy resulting in mild-to-moderate spinal canal and moderate to severe neural foraminal stenosis at multiple levels. Upper chest: Negative. Other: Atherosclerotic calcification of the carotid bulbs. IMPRESSION: 1. No acute intracranial hemorrhage. 2. Scalp  contusion containing air over the parietal bone on the right. 3. Atrophy with chronic microvascular ischemic changes. 4. Postsurgical changes in the occipital region on the right. 5. Multilevel degenerative changes in the cervical spine without evidence of acute fracture. Electronically Signed   By: Brett Fairy M.D.   On: 06/10/2021 00:50   CT Cervical Spine Wo Contrast  Result Date: 06/10/2021 CLINICAL DATA:  Neck trauma, fall. EXAM: CT HEAD WITHOUT CONTRAST CT CERVICAL SPINE WITHOUT CONTRAST TECHNIQUE: Multidetector CT imaging of the head and cervical spine was performed following the standard protocol without intravenous contrast. Multiplanar CT image reconstructions of the cervical spine were also generated. COMPARISON:  12/31/2015 FINDINGS: CT HEAD FINDINGS Brain: No acute intracranial hemorrhage or midline shift. No extra-axial fluid collection. Mild diffuse atrophy is noted. Subcortical and periventricular white matter hypodensities are noted bilaterally and there is no hydrocephalus. A CSF collection is noted in the posterior fossa on the right resulting in mild mass effect on the right cerebellar hemisphere, possible arachnoid cyst versus postsurgical changes. Vascular: Atherosclerotic calcification of the carotid siphons. No hyperdense vessel. Skull: No acute fracture. Surgical changes are present in the occipital bone on the right Sinuses/Orbits: Mild mucosal thickening in the left maxillary sinus. Metallic density is noted in the right orbit. Other: There is a scalp hematoma with foci of air over the parietal bone on the right. CT CERVICAL SPINE FINDINGS Alignment: There is mild anterolisthesis C7-T1. Skull base and vertebrae: No acute fracture. No primary bone lesion or focal pathologic process. There is fusion of the C5-C6 vertebral bodies. Soft tissues and spinal canal: No prevertebral fluid or swelling. No visible canal hematoma. Disc levels: Multilevel intervertebral disc space narrowing,  osteophyte formation and facet arthropathy resulting in mild-to-moderate spinal canal and moderate to severe neural foraminal stenosis at multiple levels. Upper chest: Negative. Other: Atherosclerotic calcification of the carotid bulbs. IMPRESSION: 1. No acute intracranial hemorrhage. 2. Scalp contusion containing air over the parietal bone on the right. 3. Atrophy with chronic microvascular ischemic changes. 4. Postsurgical changes in the occipital region on the right. 5. Multilevel degenerative changes in the cervical spine without evidence of acute fracture. Electronically Signed   By: Brett Fairy M.D.   On: 06/10/2021 00:50   US Renal  Result Date: 06/10/2021 CLINICAL DATA:  Renal failure. EXAM: RENAL / URINARY TRACT ULTRASOUND COMPLETE COMPARISON:  03/08/2021. FINDINGS: Right Kidney: Renal measurements: 11.3 x 6.7 x 6.1 = volume: 242.8 mL. Increased echogenicity. No renal calculus. Moderate hydronephrosis. Left Kidney: Renal measurements: 10.8 x 6.2 x 5.3 = volume: 184.7 mL. Increased echogenicity. No renal calculus. Moderate hydronephrosis. Bladder: The urinary bladder is markedly distended. Prevoid bladder volume 3036 mL. Patient unable to void for postvoid bladder volume. Other: The prostate gland is enlarged measuring 6.1 x 6.3 x 5.0 cm. IMPRESSION: 1. Markedly distended urinary bladder with volume of 3036 mL. Patient was unable to void for examination, concerning for  bladder outlet obstruction. 2. Moderate bilateral hydronephrosis. 3. Increased renal echogenicity bilaterally, compatible with medical renal disease. 4. Enlarged prostate gland. Electronically Signed   By: Brett Fairy M.D.   On: 06/10/2021 02:38   VAS [place] LOWER EXTREMITY VENOUS (DVT)  Result Date: 06/10/2021  Lower Venous DVT Study Patient Name:  Samuel Fisher  Date of Exam:   06/10/2021 Medical Rec #: [id number]           Accession #:    [id number] Date of Birth: 11-08-1945           Patient Gender: M Patient Age:   75 years  Exam Location:  [facility] Procedure:      VAS [place] LOWER EXTREMITY VENOUS (DVT) Referring Phys: ERIC CHEN --------------------------------------------------------------------------------  Indications: Edema.  Comparison Study: No prior study Performing Technologist: Maudry Mayhew MHA, RDMS, RVT, RDCS  Examination Guidelines: A complete evaluation includes B-mode imaging, spectral Doppler, color Doppler, and power Doppler as needed of all accessible portions of each vessel. Bilateral testing is considered an integral part of a complete examination. Limited examinations for reoccurring indications may be performed as noted. The reflux portion of the exam is performed with the patient in reverse Trendelenburg.  +-----+---------------+---------+-----------+----------+--------------+  RIGHT Compressibility Phasicity Spontaneity Properties Thrombus Aging  +-----+---------------+---------+-----------+----------+--------------+  CFV   Full            Yes       Yes                                    +-----+---------------+---------+-----------+----------+--------------+   +---------+---------------+---------+-----------+----------+--------------+  LEFT      Compressibility Phasicity Spontaneity Properties Thrombus Aging  +---------+---------------+---------+-----------+----------+--------------+  CFV       Full            Yes       Yes                                    +---------+---------------+---------+-----------+----------+--------------+  SFJ       Full                                                             +---------+---------------+---------+-----------+----------+--------------+  FV Prox   Full                                                             +---------+---------------+---------+-----------+----------+--------------+  FV Mid    Full                                                             +---------+---------------+---------+-----------+----------+--------------+  FV Distal Full                                                              +---------+---------------+---------+-----------+----------+--------------+  PFV       Full                                                             +---------+---------------+---------+-----------+----------+--------------+  POP       Full            Yes       Yes                                    +---------+---------------+---------+-----------+----------+--------------+  PTV       Full                                                             +---------+---------------+---------+-----------+----------+--------------+  PERO      None                      No                     Acute           +---------+---------------+---------+-----------+----------+--------------+     Summary: RIGHT: - No evidence of common femoral vein obstruction.  LEFT: - Findings consistent with acute deep vein thrombosis involving the left peroneal veins. - No cystic structure found in the popliteal fossa.  *See table(s) above for measurements and observations.    Preliminary     Assessment: 75 year old with postobstructive renal failure secondary to bladder outlet obstruction.  He seems to be improving with his Foley catheter in place.  He had bilateral hydronephrosis and a distended bladder on ultrasound.  Renal function is improving.  Fortunately, the patient does not appear to be developing significant gross hematuria.  Plan: Spoke with the patient about the clinical scenario.  He should get an ultrasound tomorrow prior to discharge.  He will go home with a Foley catheter.  We will schedule him for follow-up next week for a voiding trial.  I spoke to him about the long-term plan, as  he may not passes voiding trial, at least initially.  He will likely need to learn to clean intermittent cath.  We can also discuss bladder outlet surgery in the future.  However, given that he has a DVT, and will be anticoagulated for the next 3 months, we will wait and see how he is able  to void on his own prior to any further discussion of surgery.   Louis Meckel, MD Urology 06/11/2021, 1:58 PM

## 2021-06-11 NOTE — Evaluation (Signed)
Physical Therapy Evaluation Patient Details Name: Samuel Fisher MRN: [id number] DOB: [birth year] Today's Date: 06/11/2021  History of Present Illness  75yo male who presented on 12/21 after a fall at home. Admitted with AKI, urinary retention, and w/u after fall. PMH brain tumor s/p excision, HOH, HLD, HTN, inner ear dysfunction, THA  Clinical Impression   Patient received in bed, very pleasant and cooperative; very talkative and did need redirection frequently. Mildly impulsive, may be his baseline. Able to mobilize on a supervision to min guard basis with RW and moved well, did have slight dragging of R foot but he/spouse report this is also his baseline. Per RN, when they have tried to mobilize with him without RW he does tend to reach out for external support. Left in bed in care of OT. I do feel he would benefit from skilled OP PT f/u but he politely declines- wants to try returning to the gym first and if this does not help him return to baseline he will reach out to his PCP for a PT referral. Will continue to follow while he remains in house.        Recommendations for follow up therapy are one component of a multi-disciplinary discharge planning process, led by the attending physician.  Recommendations may be updated based on patient status, additional functional criteria and insurance authorization.  Follow Up Recommendations Other (comment) (would benefit from skilled OP PT, politely declining right now)    Assistance Recommended at Discharge PRN  Functional Status Assessment Patient has had a recent decline in their functional status and demonstrates the ability to make significant improvements in function in a reasonable and predictable amount of time.  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, HOH Restrictions Weight Bearing Restrictions: No      Mobility  Bed Mobility Overal bed mobility: Needs Assistance Bed Mobility: Supine to Sit;Sit to Supine     Supine to sit: Supervision Sit to supine: Supervision   General bed mobility comments: extra time/increased effort and S for safety with lines    Transfers Overall transfer level: Needs assistance Equipment used: Rolling walker (2 wheels) Transfers: Sit to/from Stand Sit to Stand: Min guard           General transfer comment: min guard, did need Min cues for hand placement/sequencing    Ambulation/Gait Ambulation/Gait assistance: Min guard Gait Distance (Feet): 200 Feet Assistive device: Rolling walker (2 wheels) Gait Pattern/deviations: Step-through pattern;Trunk flexed;Drifts right/left Gait velocity: decreased     General Gait Details: flexed at trunk, mild dragging of R foot but tells me this is his baseline- steady with RW but needed intermittent cues for proximity to device  Stairs            Wheelchair Mobility    Modified Rankin (Stroke Patients Only)       Balance Overall balance assessment: Needs assistance;History of Falls Sitting-balance support: Feet supported;No upper extremity supported Sitting balance-Leahy Scale: Normal     Standing balance support: Bilateral upper extremity supported;During functional activity Standing balance-Leahy Scale: Good Standing balance comment: per RN, often reaches out for support when not using RW                             Pertinent Vitals/Pain Pain  Assessment: 0-10 Pain Score: 3  Pain Location: generalized Pain Descriptors / Indicators: Discomfort Pain Intervention(s): Limited activity within patient's tolerance;Monitored during session    Home Living Family/patient expects to be discharged to:: Private residence Living Arrangements: Spouse/significant other Available Help at  Discharge: Family;Available 24 hours/day Type of Home: House Home Access: Stairs to enter   [employer] of Steps: 1 no ste but could hold onto door frame   Home Layout: Two level;Able to live on main level with bedroom/bathroom Home Equipment: Rolling Walker (2 wheels);Cane - single point Additional Comments: has equipment from hip replacements at [facility]    Prior Function Prior Level of Function : Independent/Modified Independent             Mobility Comments: exercises 2-3x/week at the gym; usually  does real well       Hand Dominance   Dominant Hand: Right    Extremity/Trunk Assessment   Upper Extremity Assessment Upper Extremity Assessment: Defer to OT evaluation    Lower Extremity Assessment Lower Extremity Assessment: Generalized weakness    Cervical / Trunk Assessment Cervical / Trunk Assessment: Kyphotic  Communication   Communication: No difficulties  Cognition Arousal/Alertness: Awake/alert Behavior During Therapy: WFL for tasks assessed/performed Overall Cognitive Status: Within Functional Limits for tasks assessed                                 General Comments: slightly impulsive, very talkative but easily redirected        General Comments      Exercises     Assessment/Plan    PT Assessment Patient needs continued PT services  PT Problem List Decreased strength;Decreased knowledge of use of DME;Decreased safety awareness;Decreased balance;Decreased mobility       PT Treatment Interventions DME instruction;Balance training;Gait training;Stair training;Functional mobility training;Therapeutic activities;Patient/family education;Therapeutic exercise;Neuromuscular re-education    PT Goals (Current goals can be found in the Care Plan section)  Acute Rehab PT Goals Patient Stated Goal: go home when able, get back to gym PT Goal Formulation: With patient/family Time For Goal Achievement: 06/25/21 Potential to  Achieve Goals: Good    Frequency Min 3X/week   Barriers to discharge        Co-evaluation               AM-PAC PT "6 Clicks" Mobility  Outcome Measure Help needed turning from your back to your side while in a flat bed without using bedrails?: None Help needed moving from lying on your back to sitting on the side of a flat bed without using bedrails?: A Little Help needed moving to and from a bed to a chair (including a wheelchair)?: A Little Help needed standing up from a chair using your arms (e.g., wheelchair or bedside chair)?: A Little Help needed to walk in hospital room?: A Little Help needed climbing 3-5 steps with a railing? : A Little 6 Click Score: 19    End of Session Equipment Utilized During Treatment: Gait belt Activity Tolerance: Patient tolerated treatment well Patient left: in bed;Other (comment);with family/visitor present (OT present and attending) Nurse Communication: Mobility status PT Visit Diagnosis: Unsteadiness on feet (R26.81);Dizziness and giddiness (R42);History of falling (Z91.81);Muscle weakness (generalized) (M62.81)    Time: 6063-0160 PT Time Calculation (min) (ACUTE ONLY): 25 min   Charges:   PT Evaluation $PT Eval Moderate Complexity: 1 Mod PT Treatments $Gait Training: 8-22 mins       Marsden Zaino U  PT, DPT, PN2   Supplemental Physical Therapist [place]    Pager [phone] Acute Rehab Office [phone]

## 2021-06-11 NOTE — Progress Notes (Signed)
°  Transition of Care [facility]) Screening Note   Patient Details  Name: RENDER MARLEY Date of Birth: Aug 14, 1945   Transition of Care [facility]) CM/SW Contact:    Joaquin Courts, RN Phone Number: 06/11/2021, 12:59 PM    Transition of Care Department [facility]) has reviewed patient and no TOC needs have been identified at this time. We will continue to monitor patient advancement through interdisciplinary progression rounds. If new patient transition needs arise, please place a TOC consult.

## 2021-06-11 NOTE — Progress Notes (Signed)
[place] for heparin Indication: acute DVT  Allergies  Allergen Reactions   Bee Venom Anaphylaxis    Allergic to Bee Sting -unknown reaction    Patient Measurements: Height: 5' 9.5" (176.5 cm) Weight: 79.2 kg (174 lb 9.7 oz) IBW/kg (Calculated) : 71.85 Heparin Dosing Weight: 86 kg  Vital Signs: Temp: 98.5 F (36.9 C) (12/23 0504) Temp Source: Oral (12/23 0504) BP: 157/84 (12/23 0504) Pulse Rate: 84 (12/23 0504)  Labs: Recent Labs    06/10/21 0030 06/10/21 0931 06/11/21 0035 06/11/21 0834  HGB 10.3*  --  9.1*  --   HCT 31.5*  --  27.1*  --   PLT 172  --  174  --   HEPARINUNFRC  --   --  0.53 0.71*  CREATININE 3.19* 2.85* 2.60*  --      Estimated Creatinine Clearance: 25.7 mL/min (A) (by C-G formula based on SCr of 2.6 mg/dL (H)).   Assessment: Patient is a 75 y.o M presented to the ED on 12/21 s/p fall at home with scalp laceration. Head CT showed scalp contusion and was negative for acute intracranial hemorrhage.  He also c/o left LLE swelling with LE doppler on 12/22 came back positive for "acute deep vein thrombosis involving the left  peroneal veins."  Pharmacy has been consulted to dose heparin for VTE treatment.  - patient received heparin 5000 units SQ at 1443 on 12/22  06/11/21 Heparin level = 0.71 (slightly supratherapeutic) with heparin gtt @ 1300 units/hr Hgb = 9.1  Pltc 361Q No complications of therapy noted RN noted blood in urine, however this has been present since foley insertion on 12/22  Goal of Therapy:  Heparin level 0.3-0.7 units/ml Monitor platelets by anticoagulation protocol: Yes   Plan:  - Reduce heparin drip to 1200 units/hr - Recheck 8 hr heparin level to confirm therapeutic dose - Monitor for s/sx bleeding  Dimple Nanas, PharmD 06/11/2021,10:00 AM

## 2021-06-11 NOTE — Progress Notes (Signed)
Mobility Specialist - Progress Note    06/11/21 1144  Mobility  Activity Ambulated in hall  Level of Assistance Contact guard assist, steadying assist  [place] wheel walker  Distance Ambulated (ft) 400 ft  Mobility Ambulated with assistance in hallway  Mobility Response Tolerated well  Mobility performed by Mobility specialist  $Mobility charge 1 Mobility   Pt agreeable to mobilize and used RW to ambulate 400 ft in hallway. No complaints made during session. Pt tends to drag R foot when ambulating, but he states that "its normal". Pt returned to bed and was left with call bell at side and bed alarm on.   [place] Specialist Acute Rehabilitation Services Phone: [phone] 06/11/21, 11:46 AM

## 2021-06-12 ENCOUNTER — Inpatient Hospital Stay (HOSPITAL_COMMUNITY): Payer: Medicare Other

## 2021-06-12 DIAGNOSIS — N179 Acute kidney failure, unspecified: Secondary | ICD-10-CM | POA: Diagnosis not present

## 2021-06-12 LAB — CBC
HCT: 26.7 % — ABNORMAL LOW (ref 39.0–52.0)
Hemoglobin: 9.1 g/dL — ABNORMAL LOW (ref 13.0–17.0)
MCH: 33.1 pg (ref 26.0–34.0)
MCHC: 34.1 g/dL (ref 30.0–36.0)
MCV: 97.1 fL (ref 80.0–100.0)
Platelets: 146 10*3/uL — ABNORMAL LOW (ref 150–400)
RBC: 2.75 MIL/uL — ABNORMAL LOW (ref 4.22–5.81)
RDW: 12.8 % (ref 11.5–15.5)
WBC: 6.9 10*3/uL (ref 4.0–10.5)
nRBC: 0 % (ref 0.0–0.2)

## 2021-06-12 LAB — BASIC METABOLIC PANEL WITH GFR
Anion gap: 8 (ref 5–15)
BUN: 33 mg/dL — ABNORMAL HIGH (ref 8–23)
CO2: 20 mmol/L — ABNORMAL LOW (ref 22–32)
Calcium: 8.6 mg/dL — ABNORMAL LOW (ref 8.9–10.3)
Chloride: 110 mmol/L (ref 98–111)
Creatinine, Ser: 2.24 mg/dL — ABNORMAL HIGH (ref 0.61–1.24)
GFR, Estimated: 30 mL/min — ABNORMAL LOW
Glucose, Bld: 119 mg/dL — ABNORMAL HIGH (ref 70–99)
Potassium: 3.6 mmol/L (ref 3.5–5.1)
Sodium: 138 mmol/L (ref 135–145)

## 2021-06-12 LAB — URINE CULTURE: Culture: NO GROWTH

## 2021-06-12 LAB — HEPARIN LEVEL (UNFRACTIONATED): Heparin Unfractionated: 0.46 [IU]/mL (ref 0.30–0.70)

## 2021-06-12 MED ORDER — APIXABAN 5 MG PO TABS: 10.0000 mg | ORAL_TABLET | Freq: Two times a day (BID) | ORAL | 6 refills | Status: DC

## 2021-06-12 MED ORDER — APIXABAN 5 MG PO TABS
10.0000 mg | ORAL_TABLET | Freq: Two times a day (BID) | ORAL | Status: DC
  Administered 2021-06-12: 09:00:00 10 mg via ORAL
  Filled 2021-06-12: qty 2

## 2021-06-12 MED ORDER — APIXABAN (ELIQUIS) EDUCATION KIT FOR DVT/PE PATIENTS
PACK | Freq: Once | Status: DC
  Filled 2021-06-12: qty 1

## 2021-06-12 MED ORDER — HYDRALAZINE HCL 25 MG PO TABS: 25.0000 mg | ORAL_TABLET | Freq: Three times a day (TID) | ORAL | 1 refills | Status: DC

## 2021-06-12 MED ORDER — FINASTERIDE 5 MG PO TABS: 5.0000 mg | ORAL_TABLET | Freq: Every day | ORAL | 1 refills | Status: DC

## 2021-06-12 MED ORDER — APIXABAN 5 MG PO TABS: 5.0000 mg | ORAL_TABLET | Freq: Two times a day (BID) | ORAL | Status: DC

## 2021-06-12 MED ORDER — TAMSULOSIN HCL 0.4 MG PO CAPS: 0.4000 mg | ORAL_CAPSULE | Freq: Every day | ORAL | 1 refills | Status: DC

## 2021-06-12 NOTE — Progress Notes (Addendum)
[place] for heparin Indication: acute DVT  Allergies  Allergen Reactions   Bee Venom Anaphylaxis    Allergic to Bee Sting -unknown reaction    Patient Measurements: Height: 5' 9.5" (176.5 cm) Weight: 80.7 kg (177 lb 14.6 oz) IBW/kg (Calculated) : 71.85 Heparin Dosing Weight: 86 kg  Vital Signs: Temp: 97.9 F (36.6 C) (12/24 0526) Temp Source: Oral (12/24 0526) BP: 155/84 (12/24 0526) Pulse Rate: 85 (12/24 0526)  Labs: Recent Labs    06/10/21 0030 06/10/21 0030 06/10/21 0931 06/11/21 0035 06/11/21 0834 06/11/21 1956 06/12/21 0427  HGB 10.3*  --   --  9.1*  --   --  9.1*  HCT 31.5*  --   --  27.1*  --   --  26.7*  PLT 172  --   --  174  --   --  146*  HEPARINUNFRC  --    < >  --  0.53 0.71* 0.46 0.46  CREATININE 3.19*  --  2.85* 2.60*  --   --  2.24*   < > = values in this interval not displayed.     Estimated Creatinine Clearance: 29.9 mL/min (A) (by C-G formula based on SCr of 2.24 mg/dL (H)).   Assessment: Patient is a 75 y.o M presented to the ED on 12/21 s/p fall at home with scalp laceration. Head CT showed scalp contusion and was negative for acute intracranial hemorrhage.  He also c/o left LLE swelling with LE doppler on 12/22 came back positive for "acute deep vein thrombosis involving the left  peroneal veins."  Pharmacy has been consulted to dose heparin for VTE treatment.  06/12/21 Heparin level remains therapeutic (0.46) with heparin gtt @ 1200 units/hr CBC:  Hgb remains low/stable at 9.1, Plt decreased to 962 No complications of therapy noted Blood in urine 12/22-12/23, but RN reports it cleared up overnight   Goal of Therapy:  Heparin level 0.3-0.7 units/ml Monitor platelets by anticoagulation protocol: Yes   Plan:  - Continue heparin drip at 1200 units/hr - Daily heparin level and CBC - Monitor for s/sx bleeding - Follow up long-term anticoagulation plans  Gretta Arab PharmD, BCPS Clinical  Pharmacist [facility] main pharmacy [phone] 06/12/2021 8:09 AM    Addendum: Pharmacy is consulted to transition to Eliquis Discontinue heparin drip, at the same time as starting Eliquis Eliquis 10mg  PO BID x 7 days, then 5 mg PO BID Pharmacy to provide discount card and education prior to discharge  Gretta Arab PharmD, BCPS Clinical Pharmacist [facility] main pharmacy [phone] 06/12/2021 8:44 AM

## 2021-06-12 NOTE — Progress Notes (Signed)
Patient educated on discontinuation of heparin drip and replacement with PO eliquis. Patient educated on s/s of bleeding and states understanding.

## 2021-06-12 NOTE — Progress Notes (Signed)
PIV removed. AVS discussed with patient and wife at bedside. Catheter maintenance and care reviewed, patient and wife verbalize understanding. All follow up appointments and discharge medications reviewed and understanding verbalized by both parties.

## 2021-06-12 NOTE — Discharge Summary (Signed)
Physician Discharge Summary  DUFFY Samuel JZP:[id number] DOB: Sep 24, 1945 DOA: 06/09/2021  PCP: [name], MD  Admit date: 06/09/2021  Discharge date: 06/12/2021  Admitted From: Home.  Disposition:  Home.  Recommendations for Outpatient Follow-up:  Follow up with PCP in 1-2 weeks. Please obtain BMP/CBC in one week. Advised to follow-up with urologist in 1 week. Patient is being discharged home with indwelling Foley catheter.  Losartan has been discontinued,  started on hydralazine 25 mg 3 times daily. Advised to take Eliquis 10 mg twice a day for 7 days followed by Eliquis 5 mg twice a day for next 3 to 6 months for DVT.  Home Health: Fisher Equipment / Devices:Fisher  Discharge Condition: Stable CODE STATUS:Full code Diet recommendation: Heart Healthy   Brief Summary / Hospital Course: This 75 years old male with PMH significant for BPH, hypertension, hearing loss presented in the ED s/p fall at home. Patient reports he and his friends were watching [place] football game. They had drank in moderation.  Patient does not remember how much he has drank. Patient reports after he got up from the chair,  he stumbled backwards and  hit his head against the bookshelf,  he cut his scalp.  Patient was brought in the ED,  he was found to have alcohol level of 266. He was unable to urinate, Bladder scan greater than 1 L in his bladder.  Patient has developed acute urinary retention requiring indwelling Foley catheter.  Renal ultrasound showed bladder outlet obstruction with moderate bilateral hydronephrosis. Urologist was consulted, recommended Patient can go home with Foley catheter and outpatient follow-up. Patient has developed acute kidney injury which was improving with IV hydration. He was found to have left leg swelling venous duplex positive for DVT,  started on IV heparin.  Patient remained on IV heparin for 48 hours.  Repeat renal ultrasound shows resolution of the bilateral  hydronephrosis.  Renal functions slowly improving.  Patient want to be discharged.  Patient is started on Eliquis for anticoagulation.  Patient is being discharged home and patient will follow up with urologist in 1 week.    He was managed for below problems.   Discharge Diagnoses:  Principal Problem:   AKI (acute kidney injury) ([place]) Active Problems:   Essential hypertension, benign   BPH (benign prostatic hyperplasia)   Acute urinary retention   Fall at home, initial encounter   Alcoholic intoxication without complication ([place])   Leg edema, left  Acute kidney injury : > Improving Suspect postrenal, likely sec. to obstructive uropathy due to enlarged prostate. Foley catheter inserted, found to have 3 L of fluid in the bladder. Renal functions improving with bladder decompression.  Urology is consulted, recommended to continue Foley catheter.  Outpatient follow-up Continue IV hydration, renal functions improving 3.19> 2.85> 2.60 >2.20 Renal ultrasound shows bilateral moderate hydronephrosis with enlarged prostate. Repeat renal ultrasound shows resolution of hydronephrosis.   Acute urinary retention: Continue indwelling Foley catheter, Flomax and Proscar. Urologist recommended outpatient follow-up.   Essential hypertension: Hold losartan due to AKI. Continue hydralazine 25 mg 3 times daily.   BPH: Patient has elevated PSA, follows up with urologist. Continue Proscar and Flomax   Fall: Likely secondary to alcohol intoxication. Patient has a laceration on the posterior scalp. Laceration was stapled by ED PT and OT recommended no PT needs   Alcohol intoxication: Patient does not drink alcohol every day. He drinks is on a special occasions.   Left leg DVT: Venous ultrasound confirms acute DVT  Continue IV heparin, subsequent transition to [place]. Transitioned  to Eliquis.  Advised to take 10 mg twice a day for 7 days followed by 5 mg twice a day for 3 to  23-month  Discharge Instructions  Discharge Instructions     Call MD for:  difficulty breathing, headache or visual disturbances   Complete by: As directed    Call MD for:  persistant dizziness or light-headedness   Complete by: As directed    Call MD for:  persistant nausea and vomiting   Complete by: As directed    Diet - low sodium heart healthy   Complete by: As directed    Diet Carb Modified   Complete by: As directed    Discharge instructions   Complete by: As directed    Advised to follow-up with primary care physician in 1 week. Advised to follow-up with urologist in 1 week. Patient is being discharged home with indwelling Foley catheter. Patient's blood pressure medication was discontinued and started on hydralazine 25 mg 3 times daily. Advised to take Eliquis 10 mg twice a day for 7 days followed by Eliquis 5 mg twice a day for next 3 to 6 months.   Discharge wound care:   Complete by: As directed    Follow-up PCP in 1 week   Increase activity slowly   Complete by: As directed       Allergies as of 06/12/2021       Reactions   Bee Venom Anaphylaxis   Allergic to Bee Sting -unknown reaction        Medication List     STOP taking these medications    amoxicillin 500 MG capsule Commonly known as: AMOXIL   Edarbi 80 MG Tabs Generic drug: Azilsartan Medoxomil   ibuprofen 200 MG tablet Commonly known as: ADVIL       TAKE these medications    apixaban 5 MG Tabs tablet Commonly known as: ELIQUIS Take 2 tablets (10 mg total) by mouth 2 (two) times daily. Advised to take Eliquis 10 mg(2 tablets) twice a day for 7 days followed by Eliquis 5 mg(1 tablet) twice a day for next 3 to 6 months.   finasteride 5 MG tablet Commonly known as: PROSCAR Take 1 tablet (5 mg total) by mouth daily. Start taking on: June 13, 2021   fluticasone 50 MCG/ACT nasal spray Commonly known as: FLONASE Place 1-2 sprays into both nostrils daily as needed for allergies.    hydrALAZINE 25 MG tablet Commonly known as: APRESOLINE Take 1 tablet (25 mg total) by mouth every 8 (eight) hours.   tamsulosin 0.4 MG Caps capsule Commonly known as: FLOMAX Take 1 capsule (0.4 mg total) by mouth daily. Start taking on: June 13, 2021   VITAMIN B-12 IJ Inject 1,000 mcg as directed every 30 (thirty) days.               Discharge Care Instructions  (From admission, onward)           Start     Ordered   06/12/21 0000  Discharge wound care:       Comments: Follow-up PCP in 1 week   06/12/21 1121            Follow-up Information     [name], MD Follow up in 1 week(s).   Specialty: Internal Medicine Contact information: [place] [address] [phone]         Ardis Hughs, MD Follow up in  1 week(s).   Specialty: Urology Contact information: [place] [address] [phone]                Allergies  Allergen Reactions   Bee Venom Anaphylaxis    Allergic to Bee Sting -unknown reaction    Consultations: Urology   Procedures/Studies: CT Head Wo Contrast  Result Date: 06/10/2021 CLINICAL DATA:  Neck trauma, fall. EXAM: CT HEAD WITHOUT CONTRAST CT CERVICAL SPINE WITHOUT CONTRAST TECHNIQUE: Multidetector CT imaging of the head and cervical spine was performed following the standard protocol without intravenous contrast. Multiplanar CT image reconstructions of the cervical spine were also generated. COMPARISON:  12/31/2015 FINDINGS: CT HEAD FINDINGS Brain: No acute intracranial hemorrhage or midline shift. No extra-axial fluid collection. Mild diffuse atrophy is noted. Subcortical and periventricular white matter hypodensities are noted bilaterally and there is no hydrocephalus. A CSF collection is noted in the posterior fossa on the right resulting in mild mass effect on the right cerebellar hemisphere, possible arachnoid cyst versus postsurgical changes. Vascular:  Atherosclerotic calcification of the carotid siphons. No hyperdense vessel. Skull: No acute fracture. Surgical changes are present in the occipital bone on the right Sinuses/Orbits: Mild mucosal thickening in the left maxillary sinus. Metallic density is noted in the right orbit. Other: There is a scalp hematoma with foci of air over the parietal bone on the right. CT CERVICAL SPINE FINDINGS Alignment: There is mild anterolisthesis C7-T1. Skull base and vertebrae: No acute fracture. No primary bone lesion or focal pathologic process. There is fusion of the C5-C6 vertebral bodies. Soft tissues and spinal canal: No prevertebral fluid or swelling. No visible canal hematoma. Disc levels: Multilevel intervertebral disc space narrowing, osteophyte formation and facet arthropathy resulting in mild-to-moderate spinal canal and moderate to severe neural foraminal stenosis at multiple levels. Upper chest: Negative. Other: Atherosclerotic calcification of the carotid bulbs. IMPRESSION: 1. No acute intracranial hemorrhage. 2. Scalp contusion containing air over the parietal bone on the right. 3. Atrophy with chronic microvascular ischemic changes. 4. Postsurgical changes in the occipital region on the right. 5. Multilevel degenerative changes in the cervical spine without evidence of acute fracture. Electronically Signed   By: Brett Fairy M.D.   On: 06/10/2021 00:50   CT Cervical Spine Wo Contrast  Result Date: 06/10/2021 CLINICAL DATA:  Neck trauma, fall. EXAM: CT HEAD WITHOUT CONTRAST CT CERVICAL SPINE WITHOUT CONTRAST TECHNIQUE: Multidetector CT imaging of the head and cervical spine was performed following the standard protocol without intravenous contrast. Multiplanar CT image reconstructions of the cervical spine were also generated. COMPARISON:  12/31/2015 FINDINGS: CT HEAD FINDINGS Brain: No acute intracranial hemorrhage or midline shift. No extra-axial fluid collection. Mild diffuse atrophy is noted. Subcortical  and periventricular white matter hypodensities are noted bilaterally and there is no hydrocephalus. A CSF collection is noted in the posterior fossa on the right resulting in mild mass effect on the right cerebellar hemisphere, possible arachnoid cyst versus postsurgical changes. Vascular: Atherosclerotic calcification of the carotid siphons. No hyperdense vessel. Skull: No acute fracture. Surgical changes are present in the occipital bone on the right Sinuses/Orbits: Mild mucosal thickening in the left maxillary sinus. Metallic density is noted in the right orbit. Other: There is a scalp hematoma with foci of air over the parietal bone on the right. CT CERVICAL SPINE FINDINGS Alignment: There is mild anterolisthesis C7-T1. Skull base and vertebrae: No acute fracture. No primary bone lesion or focal pathologic process. There is fusion of the C5-C6 vertebral bodies. Soft  tissues and spinal canal: No prevertebral fluid or swelling. No visible canal hematoma. Disc levels: Multilevel intervertebral disc space narrowing, osteophyte formation and facet arthropathy resulting in mild-to-moderate spinal canal and moderate to severe neural foraminal stenosis at multiple levels. Upper chest: Negative. Other: Atherosclerotic calcification of the carotid bulbs. IMPRESSION: 1. No acute intracranial hemorrhage. 2. Scalp contusion containing air over the parietal bone on the right. 3. Atrophy with chronic microvascular ischemic changes. 4. Postsurgical changes in the occipital region on the right. 5. Multilevel degenerative changes in the cervical spine without evidence of acute fracture. Electronically Signed   By: Brett Fairy M.D.   On: 06/10/2021 00:50   US RENAL  Result Date: 06/12/2021 CLINICAL DATA:  75 year old male with marked bladder distension and bilateral hydronephrosis on recent ultrasound. Renal failure. EXAM: RENAL / URINARY TRACT ULTRASOUND COMPLETE COMPARISON:  Ultrasound 06/10/2021.  Abdomen MRI 03/08/2021.  FINDINGS: Right Kidney: Renal measurements: 10.7 x 5.3 x 5.3 cm = volume: 158 mL. Resolved hydronephrosis. Cortical echogenicity within normal limits. No right renal mass. Left Kidney: Renal measurements: 10.9 x 6.4 x 4.7 cm = volume: 171 mL. Resolved left hydronephrosis. Cortical echogenicity within normal limits. No left renal mass. Bladder: Decompressed by Foley catheter with Foley balloon visible on image 43. Surrounding bladder trabeculation is apparent. Other: Fisher. IMPRESSION: Resolved bilateral hydronephrosis following bladder decompression by Foley catheter. Electronically Signed   By: Genevie Ann M.D.   On: 06/12/2021 11:00   US Renal  Result Date: 06/10/2021 CLINICAL DATA:  Renal failure. EXAM: RENAL / URINARY TRACT ULTRASOUND COMPLETE COMPARISON:  03/08/2021. FINDINGS: Right Kidney: Renal measurements: 11.3 x 6.7 x 6.1 = volume: 242.8 mL. Increased echogenicity. No renal calculus. Moderate hydronephrosis. Left Kidney: Renal measurements: 10.8 x 6.2 x 5.3 = volume: 184.7 mL. Increased echogenicity. No renal calculus. Moderate hydronephrosis. Bladder: The urinary bladder is markedly distended. Prevoid bladder volume 3036 mL. Patient unable to void for postvoid bladder volume. Other: The prostate gland is enlarged measuring 6.1 x 6.3 x 5.0 cm. IMPRESSION: 1. Markedly distended urinary bladder with volume of 3036 mL. Patient was unable to void for examination, concerning for bladder outlet obstruction. 2. Moderate bilateral hydronephrosis. 3. Increased renal echogenicity bilaterally, compatible with medical renal disease. 4. Enlarged prostate gland. Electronically Signed   By: Brett Fairy M.D.   On: 06/10/2021 02:38   VAS [place] LOWER EXTREMITY VENOUS (DVT)  Result Date: 06/11/2021  Lower Venous DVT Study Patient Name:  KELLON CHALK  Date of Exam:   06/10/2021 Medical Rec #: [id number]           Accession #:    [id number] Date of Birth: 1946/02/11           Patient Gender: M Patient Age:   75 years  Exam Location:  [facility] Procedure:      VAS [place] LOWER EXTREMITY VENOUS (DVT) Referring Phys: ERIC CHEN --------------------------------------------------------------------------------  Indications: Edema.  Comparison Study: No prior study Performing Technologist: Maudry Mayhew MHA, RDMS, RVT, RDCS  Examination Guidelines: A complete evaluation includes B-mode imaging, spectral Doppler, color Doppler, and power Doppler as needed of all accessible portions of each vessel. Bilateral testing is considered an integral part of a complete examination. Limited examinations for reoccurring indications may be performed as noted. The reflux portion of the exam is performed with the patient in reverse Trendelenburg.  +-----+---------------+---------+-----------+----------+--------------+  RIGHT Compressibility Phasicity Spontaneity Properties Thrombus Aging  +-----+---------------+---------+-----------+----------+--------------+  CFV   Full  Yes       Yes                                    +-----+---------------+---------+-----------+----------+--------------+   +---------+---------------+---------+-----------+----------+--------------+  LEFT      Compressibility Phasicity Spontaneity Properties Thrombus Aging  +---------+---------------+---------+-----------+----------+--------------+  CFV       Full            Yes       Yes                                    +---------+---------------+---------+-----------+----------+--------------+  SFJ       Full                                                             +---------+---------------+---------+-----------+----------+--------------+  FV Prox   Full                                                             +---------+---------------+---------+-----------+----------+--------------+  FV Mid    Full                                                             +---------+---------------+---------+-----------+----------+--------------+  FV Distal Full                                                              +---------+---------------+---------+-----------+----------+--------------+  PFV       Full                                                             +---------+---------------+---------+-----------+----------+--------------+  POP       Full            Yes       Yes                                    +---------+---------------+---------+-----------+----------+--------------+  PTV       Full                                                             +---------+---------------+---------+-----------+----------+--------------+  PERO      Fisher                      No                     Acute           +---------+---------------+---------+-----------+----------+--------------+    Summary: RIGHT: - No evidence of common femoral vein obstruction.  LEFT: - Findings consistent with acute deep vein thrombosis involving the left peroneal veins. - No cystic structure found in the popliteal fossa.  *See table(s) above for measurements and observations. Electronically signed by Jamelle Haring on 06/11/2021 at 2:38:26 PM.    Final       Subjective: Patient was seen and examined at bedside.  Overnight events noted.  Patient reports feeling much improved.   Patient has ambulated in the hallway without any support.  His renal functions are improving.  Cleared from urology.   Patient being discharged home with indwelling Foley catheter.  Discharge Exam: Vitals:   06/11/21 2100 06/12/21 0526  BP: (!) 143/82 (!) 155/84  Pulse: 94 85  Resp: 18   Temp: 97.8 F (36.6 C) 97.9 F (36.6 C)  SpO2: 99% 99%   Vitals:   06/11/21 1323 06/11/21 2100 06/12/21 0500 06/12/21 0526  BP: (!) 143/83 (!) 143/82  (!) 155/84  Pulse: (!) 108 94  85  Resp: 18 18    Temp: 98 F (36.7 C) 97.8 F (36.6 C)  97.9 F (36.6 C)  TempSrc:  Oral  Oral  SpO2: 99% 99%  99%  Weight:   80.7 kg   Height:        General: Pt is alert, awake, not in acute distress Cardiovascular: RRR,  S1/S2 +, no rubs, no gallops Respiratory: CTA bilaterally, no wheezing, no rhonchi Abdominal: Soft, NT, ND, bowel sounds + Extremities: no edema, no cyanosis    The results of significant diagnostics from this hospitalization (including imaging, microbiology, ancillary and laboratory) are listed below for reference.     Microbiology: Recent Results (from the past 240 hour(s))  Resp Panel by RT-PCR (Flu A&B, Covid) Nasopharyngeal Swab     Status: Fisher   Collection Time: 06/10/21  3:39 AM   Specimen: Nasopharyngeal Swab; Nasopharyngeal(NP) swabs in vial transport medium  Result Value Ref Range Status   SARS Coronavirus 2 by RT PCR NEGATIVE NEGATIVE Final    Comment: (NOTE) SARS-CoV-2 target nucleic acids are NOT DETECTED.  The SARS-CoV-2 RNA is generally detectable in upper respiratory specimens during the acute phase of infection. The lowest concentration of SARS-CoV-2 viral copies this assay can detect is 138 copies/mL. A negative result does not preclude SARS-Cov-2 infection and should not be used as the sole basis for treatment or other patient management decisions. A negative result may occur with  improper specimen collection/handling, submission of specimen other than nasopharyngeal swab, presence of viral mutation(s) within the areas targeted by this assay, and inadequate number of viral copies(<138 copies/mL). A negative result must be combined with clinical observations, patient history, and epidemiological information. The expected result is Negative.  Fact Sheet for Patients:  EntrepreneurPulse.com.au  Fact Sheet for Healthcare Providers:  IncredibleEmployment.be  This test is no t yet approved or cleared by the [place] FDA and  has been authorized for detection and/or diagnosis of SARS-CoV-2 by FDA under an Emergency Use Authorization (EUA). This EUA will remain  in effect (meaning this test can be used) for the duration  of  the COVID-19 declaration under Section 564(b)(1) of the Act, 21 U.S.C.section 360bbb-3(b)(1), unless the authorization is terminated  or revoked sooner.       Influenza A by PCR NEGATIVE NEGATIVE Final   Influenza B by PCR NEGATIVE NEGATIVE Final    Comment: (NOTE) The Xpert Xpress SARS-CoV-2/FLU/RSV plus assay is intended as an aid in the diagnosis of influenza from Nasopharyngeal swab specimens and should not be used as a sole basis for treatment. Nasal washings and aspirates are unacceptable for Xpert Xpress SARS-CoV-2/FLU/RSV testing.  Fact Sheet for Patients: EntrepreneurPulse.com.au  Fact Sheet for Healthcare Providers: IncredibleEmployment.be  This test is not yet approved or cleared by the [place] FDA and has been authorized for detection and/or diagnosis of SARS-CoV-2 by FDA under an Emergency Use Authorization (EUA). This EUA will remain in effect (meaning this test can be used) for the duration of the COVID-19 declaration under Section 564(b)(1) of the Act, 21 U.S.C. section 360bbb-3(b)(1), unless the authorization is terminated or revoked.  Performed at [facility], [place] [address] [place], [place] [address]   Urine Culture     Status: [place]   Collection Time: 06/10/21  5:22 PM   Specimen: Urine, Catheterized  Result Value Ref Range Status   Specimen Description   Final    URINE, CATHETERIZED Performed at [facility] [address] [place], [place] [address]    Special Requests   Final    Fisher Performed at [facility], [place] [address] [place], [place] [address]    Culture   Final    NO GROWTH Performed at [facility], [place] [address] [place], [place] [address]    Report Status 06/12/2021 FINAL  Final     Labs: BNP (last 3 results) No results for input(s): BNP in the last 8760 hours. Basic Metabolic Panel: Recent Labs  Lab 06/10/21 0030  06/10/21 0931 06/11/21 0035 06/12/21 0427  NA 137 143 139 138  K 4.2 4.8 4.1 3.6  CL 111 113* 113* 110  CO2 13* 18* 17* 20*  GLUCOSE 119* 111* 121* 119*  BUN 41* 37* 36* 33*  CREATININE 3.19* 2.85* 2.60* 2.24*  CALCIUM 8.8* 9.6 9.2 8.6*  MG  --   --  1.7  --   PHOS  --   --  4.4  --    Liver Function Tests: Recent Labs  Lab 06/11/21 0035  AST 29  ALT 16  ALKPHOS 40  BILITOT 0.8  PROT 6.3*  ALBUMIN 3.8   No results for input(s): LIPASE, AMYLASE in the last 168 hours. No results for input(s): AMMONIA in the last 168 hours. CBC: Recent Labs  Lab 06/10/21 0030 06/11/21 0035 06/12/21 0427  WBC 7.8 6.8 6.9  NEUTROABS 6.0 5.4  --   HGB 10.3* 9.1* 9.1*  HCT 31.5* 27.1* 26.7*  MCV 101.0* 98.5 97.1  PLT 172 174 146*   Cardiac Enzymes: No results for input(s): CKTOTAL, CKMB, CKMBINDEX, TROPONINI in the last 168 hours. BNP: Invalid input(s): POCBNP CBG: No results for input(s): GLUCAP in the last 168 hours. D-Dimer No results for input(s): DDIMER in the last 72 hours. Hgb A1c No results for input(s): HGBA1C in the last 72 hours. Lipid Profile No results for input(s): CHOL, HDL, LDLCALC, TRIG, CHOLHDL, LDLDIRECT in the last 72 hours. Thyroid function studies No results for input(s): TSH, T4TOTAL, T3FREE, THYROIDAB in the last 72 hours.  Invalid input(s): FREET3 Anemia work up No results for  input(s): VITAMINB12, FOLATE, FERRITIN, TIBC, IRON, RETICCTPCT in the last 72 hours. Urinalysis    Component Value Date/Time   COLORURINE STRAW (A) 06/10/2021 0148   APPEARANCEUR CLEAR 06/10/2021 0148   LABSPEC 1.006 06/10/2021 0148   PHURINE 5.0 06/10/2021 0148   GLUCOSEU NEGATIVE 06/10/2021 0148   GLUCOSEU NEGATIVE 01/20/2021 0931   HGBUR SMALL (A) 06/10/2021 0148   BILIRUBINUR NEGATIVE 06/10/2021 0148   KETONESUR NEGATIVE 06/10/2021 0148   PROTEINUR NEGATIVE 06/10/2021 0148   UROBILINOGEN 0.2 01/20/2021 0931   NITRITE NEGATIVE 06/10/2021 0148   LEUKOCYTESUR NEGATIVE  06/10/2021 0148   Sepsis Labs Invalid input(s): PROCALCITONIN,  WBC,  LACTICIDVEN Microbiology Recent Results (from the past 240 hour(s))  Resp Panel by RT-PCR (Flu A&B, Covid) Nasopharyngeal Swab     Status: Fisher   Collection Time: 06/10/21  3:39 AM   Specimen: Nasopharyngeal Swab; Nasopharyngeal(NP) swabs in vial transport medium  Result Value Ref Range Status   SARS Coronavirus 2 by RT PCR NEGATIVE NEGATIVE Final    Comment: (NOTE) SARS-CoV-2 target nucleic acids are NOT DETECTED.  The SARS-CoV-2 RNA is generally detectable in upper respiratory specimens during the acute phase of infection. The lowest concentration of SARS-CoV-2 viral copies this assay can detect is 138 copies/mL. A negative result does not preclude SARS-Cov-2 infection and should not be used as the sole basis for treatment or other patient management decisions. A negative result may occur with  improper specimen collection/handling, submission of specimen other than nasopharyngeal swab, presence of viral mutation(s) within the areas targeted by this assay, and inadequate number of viral copies(<138 copies/mL). A negative result must be combined with clinical observations, patient history, and epidemiological information. The expected result is Negative.  Fact Sheet for Patients:  EntrepreneurPulse.com.au  Fact Sheet for Healthcare Providers:  IncredibleEmployment.be  This test is no t yet approved or cleared by the [place] FDA and  has been authorized for detection and/or diagnosis of SARS-CoV-2 by FDA under an Emergency Use Authorization (EUA). This EUA will remain  in effect (meaning this test can be used) for the duration of the COVID-19 declaration under Section 564(b)(1) of the Act, 21 U.S.C.section 360bbb-3(b)(1), unless the authorization is terminated  or revoked sooner.       Influenza A by PCR NEGATIVE NEGATIVE Final   Influenza B by PCR NEGATIVE  NEGATIVE Final    Comment: (NOTE) The Xpert Xpress SARS-CoV-2/FLU/RSV plus assay is intended as an aid in the diagnosis of influenza from Nasopharyngeal swab specimens and should not be used as a sole basis for treatment. Nasal washings and aspirates are unacceptable for Xpert Xpress SARS-CoV-2/FLU/RSV testing.  Fact Sheet for Patients: EntrepreneurPulse.com.au  Fact Sheet for Healthcare Providers: IncredibleEmployment.be  This test is not yet approved or cleared by the [place] FDA and has been authorized for detection and/or diagnosis of SARS-CoV-2 by FDA under an Emergency Use Authorization (EUA). This EUA will remain in effect (meaning this test can be used) for the duration of the COVID-19 declaration under Section 564(b)(1) of the Act, 21 U.S.C. section 360bbb-3(b)(1), unless the authorization is terminated or revoked.  Performed at [facility], [place] [address] [place], [place] [address]   Urine Culture     Status: Fisher   Collection Time: 06/10/21  5:22 PM   Specimen: Urine, Catheterized  Result Value Ref Range Status   Specimen Description   Final    URINE, CATHETERIZED Performed at [place] [address], [place], [place] [address]  Special Requests   Final    Fisher Performed at [facility], [place] [address], [place], [place] [address]    Culture   Final    NO GROWTH Performed at [facility], [place] [address], [place], [place] [address]    Report Status 06/12/2021 FINAL  Final     Time coordinating discharge: Over 30 minutes  SIGNED:   Shawna Clamp, MD  [facility] 06/12/2021, 11:22 AM Pager   If 7PM-7AM, please contact night-coverage

## 2021-06-12 NOTE — Progress Notes (Signed)
Physical Therapy Treatment Patient Details Name: Samuel Fisher MRN: [id number] DOB: 11/27/1945 Today's Date: 06/12/2021   History of Present Illness 75yo male who presented on 12/21 after a fall at home. Admitted with AKI, urinary retention, and w/u after fall. PMH brain tumor s/p excision, HOH, HLD, HTN, inner ear dysfunction, THA    PT Comments    Pt progressing with gait/activity tolerance.  Improving safety with gait, no LOB however intermittent cues for safety and walker proximity needed. Wife present on PT departure.   Recommendations for follow up therapy are one component of a multi-disciplinary discharge planning process, led by the attending physician.  Recommendations may be updated based on patient status, additional functional criteria and insurance authorization.  Follow Up Recommendations  Outpatient PT (would benefit, politely declines)     Assistance Recommended at Discharge Intermittent Supervision/Assistance  Equipment Recommendations  None recommended by PT    Recommendations for Other Services       Precautions / Restrictions Restrictions Weight Bearing Restrictions: No     Mobility  Bed Mobility Overal bed mobility: Modified Independent Bed Mobility: Supine to Sit                Transfers Overall transfer level: Needs assistance Equipment used: None Transfers: Sit to/from Stand Sit to Stand: Supervision           General transfer comment: for safety    Ambulation/Gait Ambulation/Gait assistance: Min guard Gait Distance (Feet): 340 Feet (10' without device, min/guard assist) Assistive device: Rolling walker (2 wheels) Gait Pattern/deviations: Step-through pattern;Trunk flexed;Drifts right/left Gait velocity: decreased     General Gait Details: flexed at trunk, mild dragging of R foot but tells me this is his baseline- steady with RW but needed intermittent cues for proximity to device and direction. amb short  distance   Stairs Stairs: Yes Stairs assistance: Min guard Stair Management: Two rails;Step to pattern;Forwards Number of Stairs: 4 General stair comments: cues for step to pattern for incr safety and to slow speed of movement. pt with good stability, no LOB, adhered to safety cues   Wheelchair Mobility    Modified Rankin (Stroke Patients Only)       Balance   Sitting-balance support: Feet supported;No upper extremity supported Sitting balance-Leahy Scale: Normal     Standing balance support: Bilateral upper extremity supported;During functional activity Standing balance-Leahy Scale: Fair                              Cognition Arousal/Alertness: Awake/alert Behavior During Therapy: WFL for tasks assessed/performed Overall Cognitive Status: Within Functional Limits for tasks assessed                                          Exercises      General Comments        Pertinent Vitals/Pain Pain Assessment: No/denies pain    Home Living                          Prior Function            PT Goals (current goals can now be found in the care plan section) Acute Rehab PT Goals PT Goal Formulation: With patient/family Time For Goal Achievement: 06/25/21 Potential to Achieve Goals: Good Progress towards PT goals: Progressing toward goals  Frequency    Min 3X/week      PT Plan Current plan remains appropriate    Co-evaluation              AM-PAC PT "6 Clicks" Mobility   Outcome Measure  Help needed turning from your back to your side while in a flat bed without using bedrails?: None Help needed moving from lying on your back to sitting on the side of a flat bed without using bedrails?: A Little Help needed moving to and from a bed to a chair (including a wheelchair)?: A Little Help needed standing up from a chair using your arms (e.g., wheelchair or bedside chair)?: A Little Help needed to walk in hospital  room?: A Little Help needed climbing 3-5 steps with a railing? : A Little 6 Click Score: 19    End of Session Equipment Utilized During Treatment: Gait belt Activity Tolerance: Patient tolerated treatment well Patient left: with call bell/phone within reach;in bed;with family/visitor present Nurse Communication: Mobility status;Other (comment) (requesting to get dressed, sec notified) PT Visit Diagnosis: Unsteadiness on feet (R26.81);Dizziness and giddiness (R42);History of falling (Z91.81);Muscle weakness (generalized) (M62.81)     Time: 1210-1226 PT Time Calculation (min) (ACUTE ONLY): 16 min  Charges:  $Gait Training: 8-22 mins                     Baxter Flattery, PT  Acute Rehab Dept ([place]) [phone] Pager [phone]  06/12/2021    [facility] 06/12/2021, 12:52 PM

## 2021-06-12 NOTE — Discharge Instructions (Signed)
Information on my medicine - ELIQUIS (apixaban)  Why was Eliquis prescribed for you? Eliquis was prescribed to treat blood clots that may have been found in the veins of your legs (deep vein thrombosis) or in your lungs (pulmonary embolism) and to reduce the risk of them occurring again.  What do You need to know about Eliquis ? The starting dose is 10 mg (two 5 mg tablets) taken TWICE daily for the FIRST SEVEN (7) DAYS, then on 06/19/2021  the dose is reduced to ONE 5 mg tablet taken TWICE daily.  Eliquis may be taken with or without food.   Try to take the dose about the same time in the morning and in the evening. If you have difficulty swallowing the tablet whole please discuss with your pharmacist how to take the medication safely.  Take Eliquis exactly as prescribed and DO NOT stop taking Eliquis without talking to the doctor who prescribed the medication.  Stopping may increase your risk of developing a new blood clot.  Refill your prescription before you run out.  After discharge, you should have regular check-up appointments with your healthcare provider that is prescribing your Eliquis.    What do you do if you miss a dose? If a dose of ELIQUIS is not taken at the scheduled time, take it as soon as possible on the same day and twice-daily administration should be resumed. The dose should not be doubled to make up for a missed dose.  Important Safety Information A possible side effect of Eliquis is bleeding. You should call your healthcare provider right away if you experience any of the following: Bleeding from an injury or your nose that does not stop. Unusual colored urine (red or dark brown) or unusual colored stools (red or black). Unusual bruising for unknown reasons. A serious fall or if you hit your head (even if there is no bleeding).  Some medicines may interact with Eliquis and might increase your risk of bleeding or clotting while on Eliquis. To help avoid  this, consult your healthcare provider or pharmacist prior to using any new prescription or non-prescription medications, including herbals, vitamins, non-steroidal anti-inflammatory drugs (NSAIDs) and supplements.  This website has more information on Eliquis (apixaban): http://www.eliquis.com/eliquis/home

## 2021-06-18 ENCOUNTER — Other Ambulatory Visit: Payer: Self-pay

## 2021-06-18 ENCOUNTER — Inpatient Hospital Stay: Payer: Medicare Other | Admitting: Internal Medicine

## 2021-06-18 ENCOUNTER — Encounter: Payer: Self-pay | Admitting: Internal Medicine

## 2021-06-18 ENCOUNTER — Ambulatory Visit (INDEPENDENT_AMBULATORY_CARE_PROVIDER_SITE_OTHER): Payer: Medicare Other | Admitting: Internal Medicine

## 2021-06-18 VITALS — BP 122/64 | HR 82 | Resp 18 | Ht 69.5 in | Wt 177.2 lb

## 2021-06-18 DIAGNOSIS — N139 Obstructive and reflux uropathy, unspecified: Secondary | ICD-10-CM

## 2021-06-18 DIAGNOSIS — N130 Hydronephrosis with ureteropelvic junction obstruction: Secondary | ICD-10-CM | POA: Diagnosis not present

## 2021-06-18 DIAGNOSIS — R519 Headache, unspecified: Secondary | ICD-10-CM | POA: Diagnosis not present

## 2021-06-18 DIAGNOSIS — I824Y2 Acute embolism and thrombosis of unspecified deep veins of left proximal lower extremity: Secondary | ICD-10-CM | POA: Diagnosis not present

## 2021-06-18 DIAGNOSIS — N179 Acute kidney failure, unspecified: Secondary | ICD-10-CM

## 2021-06-18 DIAGNOSIS — R338 Other retention of urine: Secondary | ICD-10-CM | POA: Diagnosis not present

## 2021-06-18 LAB — COMPREHENSIVE METABOLIC PANEL
ALT: 23 U/L (ref 0–53)
AST: 22 U/L (ref 0–37)
Albumin: 4.2 g/dL (ref 3.5–5.2)
Alkaline Phosphatase: 45 U/L (ref 39–117)
BUN: 31 mg/dL — ABNORMAL HIGH (ref 6–23)
CO2: 24 mEq/L (ref 19–32)
Calcium: 9.5 mg/dL (ref 8.4–10.5)
Chloride: 102 mEq/L (ref 96–112)
Creatinine, Ser: 1.96 mg/dL — ABNORMAL HIGH (ref 0.40–1.50)
GFR: 33.23 mL/min — ABNORMAL LOW (ref >60.00)
Glucose, Bld: 110 mg/dL — ABNORMAL HIGH (ref 70–99)
Potassium: 4.2 mEq/L (ref 3.5–5.1)
Sodium: 137 mEq/L (ref 135–145)
Total Bilirubin: 0.4 mg/dL (ref 0.2–1.2)
Total Protein: 7.1 g/dL (ref 6.0–8.3)

## 2021-06-18 LAB — CBC
HCT: 29.3 % — ABNORMAL LOW (ref 39.0–52.0)
Hemoglobin: 9.8 g/dL — ABNORMAL LOW (ref 13.0–17.0)
MCHC: 33.4 g/dL (ref 30.0–36.0)
MCV: 96.6 fl (ref 78.0–100.0)
Platelets: 335 10*3/uL (ref 150.0–400.0)
RBC: 3.03 Mil/uL — ABNORMAL LOW (ref 4.22–5.81)
RDW: 13 % (ref 11.5–15.5)
WBC: 8 10*3/uL (ref 4.0–10.5)

## 2021-06-18 MED ORDER — APIXABAN 5 MG PO TABS: 5.0000 mg | ORAL_TABLET | Freq: Two times a day (BID) | ORAL | 0 refills | Status: DC

## 2021-06-18 NOTE — Assessment & Plan Note (Signed)
Removal of 1 staples on exam. ED notes 2 staples but only 1 present on exam. Location of second detected but no staple present may have fallen off with combing hair.

## 2021-06-18 NOTE — Progress Notes (Signed)
° °  Subjective:   Patient ID: Samuel Fisher, male    DOB: [birth year], 75 y.o.   MRN: [id number]  HPI The patient is a 75 YO man coming in for hospital follow up (in for fall, AKI with bladder obstruction and new DVT). Discharged with catheter and eliquis for 2-6 months. Needs staple removal.   PMH, [place], social history reviewed and updated  Review of Systems  Constitutional: Negative.   HENT: Negative.    Eyes: Negative.   Respiratory:  Negative for cough, chest tightness and shortness of breath.   Cardiovascular:  Negative for chest pain, palpitations and leg swelling.  Gastrointestinal:  Negative for abdominal distention, abdominal pain, constipation, diarrhea, nausea and vomiting.  Musculoskeletal: Negative.   Skin: Negative.   Neurological: Negative.   Psychiatric/Behavioral: Negative.     Objective:  Physical Exam Constitutional:      Appearance: He is well-developed.  HENT:     Head: Normocephalic and atraumatic.  Cardiovascular:     Rate and Rhythm: Normal rate and regular rhythm.  Pulmonary:     Effort: Pulmonary effort is normal. No respiratory distress.     Breath sounds: Normal breath sounds. No wheezing or rales.  Abdominal:     General: Bowel sounds are normal. There is no distension.     Palpations: Abdomen is soft.     Tenderness: There is no abdominal tenderness. There is no rebound.  Genitourinary:    Comments: Catheter in place with bag Musculoskeletal:     Cervical back: Normal range of motion.  Skin:    General: Skin is warm and dry.  Neurological:     Mental Status: He is alert and oriented to person, place, and time.     Coordination: Coordination normal.    Vitals:   06/18/21 1318  BP: 122/64  Pulse: 82  Resp: 18  SpO2: 100%  Weight: 177 lb 3.2 oz (80.4 kg)  Height: 5' 9.5" (1.765 m)    This visit occurred during the SARS-CoV-2 public health emergency.  Safety protocols were in place, including screening questions prior to the visit,  additional usage of staff PPE, and extensive cleaning of exam room while observing appropriate contact time as indicated for disinfecting solutions.   Assessment & Plan:

## 2021-06-18 NOTE — Assessment & Plan Note (Signed)
With catheter since leaving hospital and working with urology on treatment. They have plans for voiding trial next week.

## 2021-06-18 NOTE — Assessment & Plan Note (Signed)
He had travel plane out of country end of August which could have been a trigger. Up to date on malignancy screening appropriate to age without evidence for this. Obstructive uropathy unclear if this contributed. Checking hypercoagulable panel as he relates several family members on mother's side with blood clots. If normal plans for 3 months of eliquis. If they are unable to afford they will let us know. Rx done today for eliquis 5 mg BID for #180 no refills.

## 2021-06-18 NOTE — Patient Instructions (Addendum)
March 23 will be the stop date for the eliquis unless the blood work shows something.

## 2021-06-18 NOTE — Assessment & Plan Note (Signed)
Due to obstruction. Checking CMP today for reassessment. Was improving in the hospital and advised will need to make sure the obstruction does not return. Could take up to 3 months for full renal recovery which may not return to prior baseline.

## 2021-06-20 LAB — LUPUS ANTICOAGULANT PANEL
Dilute Viper Venom Time: 113.9 s — ABNORMAL HIGH (ref 0.0–47.0)
PTT Lupus Anticoagulant: 44 s (ref 0.0–51.9)

## 2021-06-20 LAB — BETA-2-GLYCOPROTEIN I ABS, IGG/M/A
Beta-2 Glyco 1 IgA: 31 GPI IgA units — ABNORMAL HIGH (ref 0–25)
Beta-2 Glyco 1 IgM: <9 GPI IgM units (ref 0–32)
Beta-2 Glyco I IgG: <9 GPI IgG units (ref 0–20)

## 2021-06-20 LAB — DRVVT MIX: dRVVT Mix: 76.2 s — ABNORMAL HIGH (ref 0.0–40.4)

## 2021-06-20 LAB — DRVVT CONFIRM: dRVVT Confirm: 1.3 ratio — ABNORMAL HIGH (ref 0.8–1.2)

## 2021-06-24 ENCOUNTER — Inpatient Hospital Stay: Payer: Medicare Other | Admitting: Internal Medicine

## 2021-06-24 LAB — PROTHROMBIN GENE MUTATION: PROTHROMBIN (FACTOR II): NEGATIVE

## 2021-06-24 LAB — FACTOR 5 LEIDEN: Result: NEGATIVE

## 2021-06-24 LAB — CARDIOLIPIN ANTIBODIES, IGG, IGM, IGA
Anticardiolipin IgA: <2 APL-U/mL
Anticardiolipin IgG: <2 GPL-U/mL
Anticardiolipin IgM: <2 MPL-U/mL

## 2021-06-24 LAB — PROTEIN S, TOTAL: PROTEIN S ANTIGEN, TOTAL: 129 % normal (ref 70–140)

## 2021-06-24 LAB — HOMOCYSTEINE: Homocysteine: 23.6 umol/L — ABNORMAL HIGH (ref <11.4)

## 2021-06-24 LAB — ANTITHROMBIN III: AntiThromb III Func: 128 % normal (ref 80–135)

## 2021-06-24 LAB — PROTEIN S ACTIVITY: Protein S Activity: 111 % normal (ref 70–150)

## 2021-06-24 LAB — PROTEIN C, TOTAL: Protein C Antigen: 97 % normal (ref 70–140)

## 2021-06-24 LAB — PROTEIN C ACTIVITY: Protein C Activity: 181 % normal — ABNORMAL HIGH (ref 70–180)

## 2021-06-25 DIAGNOSIS — R338 Other retention of urine: Secondary | ICD-10-CM | POA: Diagnosis not present

## 2021-06-30 ENCOUNTER — Encounter: Payer: Self-pay | Admitting: Gastroenterology

## 2021-06-30 ENCOUNTER — Ambulatory Visit: Payer: Medicare Other | Admitting: Gastroenterology

## 2021-06-30 ENCOUNTER — Other Ambulatory Visit (INDEPENDENT_AMBULATORY_CARE_PROVIDER_SITE_OTHER): Payer: Medicare Other

## 2021-06-30 VITALS — BP 142/80 | HR 80 | Ht 68.0 in | Wt 178.0 lb

## 2021-06-30 DIAGNOSIS — R197 Diarrhea, unspecified: Secondary | ICD-10-CM

## 2021-06-30 DIAGNOSIS — D649 Anemia, unspecified: Secondary | ICD-10-CM

## 2021-06-30 LAB — CBC WITH DIFFERENTIAL/PLATELET
Basophils Absolute: 0 10*3/uL (ref 0.0–0.1)
Basophils Relative: 0.7 % (ref 0.0–3.0)
Eosinophils Absolute: 0.2 10*3/uL (ref 0.0–0.7)
Eosinophils Relative: 4.1 % (ref 0.0–5.0)
HCT: 30.4 % — ABNORMAL LOW (ref 39.0–52.0)
Hemoglobin: 10.1 g/dL — ABNORMAL LOW (ref 13.0–17.0)
Lymphocytes Relative: 18.9 % (ref 12.0–46.0)
Lymphs Abs: 0.9 10*3/uL (ref 0.7–4.0)
MCHC: 33.4 g/dL (ref 30.0–36.0)
MCV: 96.4 fl (ref 78.0–100.0)
Monocytes Absolute: 0.6 10*3/uL (ref 0.1–1.0)
Monocytes Relative: 13.5 % — ABNORMAL HIGH (ref 3.0–12.0)
Neutro Abs: 3 10*3/uL (ref 1.4–7.7)
Neutrophils Relative %: 62.8 % (ref 43.0–77.0)
Platelets: 234 10*3/uL (ref 150.0–400.0)
RBC: 3.15 Mil/uL — ABNORMAL LOW (ref 4.22–5.81)
RDW: 13.2 % (ref 11.5–15.5)
WBC: 4.7 10*3/uL (ref 4.0–10.5)

## 2021-06-30 LAB — IRON: Iron: 65 ug/dL (ref 42–165)

## 2021-06-30 LAB — FERRITIN: Ferritin: 188.8 ng/mL (ref 22.0–322.0)

## 2021-06-30 NOTE — Progress Notes (Signed)
Referring Provider: [name], MD Primary Care Physician:  [name], MD  Chief complaint:  Bloody diarrhea   IMPRESSION:  Bloody diarrhea in November 2022: Isolated symptoms make infectious etiologies most likely.  Thankfully he has had no further bleeding since starting Eliquis.  I recommend colonoscopy when he is able to hold Eliquis prior to the procedure.  Persistent anemia since bloody diarrhea 11/22 without additional bleeding: He has been using oral iron. Labs today to screen for iron deficiency. If blood counts are low will plan hematology consultation.   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 Stable MRI/MRCP compared to 08/2020. He has no ongoing GI symptoms.  Follow-up MRI is reassuring. Plan repeat MRI/MRCP in 1-2 years, earlier with new symptoms.    PLAN: - CBC, iron, ferritin - Continue oral iron with recommendation for QOD dosing - Colonoscopy when he is able to hold his Eliquis for 2 days prior to the procedure - MRI/MRCP in September 2024, earlier with new symptoms   This plan was created on discussion with Dr. Rush Landmark, one of gastroenterologists who performs EUS  Please see the "Patient Instructions" section for addition details about the plan.  HPI: Samuel Fisher is a 76 y.o. male who is known to me for a multiloculated cystic lesion in the pancreatic head that is in a surveillance program. He is seen today after an ED visit in November 2022 for bloody diarrhea.  The interval history is obtained through the patient and review of his electronic health record.  Prior evaluation and history included a CT of the chest with contrast 06/05/2019 that showed a cystic lesion in the pancreatic tail.  An MRI was obtained for additional evaluation 07/09/2019. Follow-up MRI with and without contrast 01/29/2020 redemonstrated a multilobulated cystic lesion of the pancreatic tail measuring 2.0 x 1.5 cm. This was unchanged compared  to the MRI from January. However, a multiloculated cystic lesion or adjacent lesions in the pancreatic head measuring approximately 1.7 x 1.3 cm have enlarged compared to the prior examination when they were 1.3 x 0.8 cm. There is no definitive communication with the pancreatic duct. No pancreatic ductal dilatation. Stable on MRI with MRCP 09/08/20 and 03/08/21. Ca-19-9 was normal. Repeat imaging planned in 2024, earlier with new symptoms.   Seen in the ED 05/19/2021 after several episodes of bright red bloody diarrhea after eating oysters at an oyster roast and drinking alcohol. No associated abdominal pain. Symptoms resolved within 24 hours with a liquid diet, Imodium, Pediolyte and used Pepto-Bismol.  Hemoglobin at that time was 9.3 down from a baseline of 13.3.  Since the ER visit he has been started on Eliquis for DVT.  Eliquis is planned for at least 2 to 6 months.  He has had no additional bleeding since starting Eliquis. GI ROS is negative. His energy is low but he has no other symptoms of anemia.   Most recent labs from 06/18/2021 showing hemoglobin of 9.8, MCV 96.6, RDW 13, platelets 335. He saw the results and wanted to start oral iron.   The patient reports having 2 normal screening colonoscopies with Dr. Earlean Shawl in the past.  Negative Cologuard earlier this year. I am unable to locate the procedure notes in [place].      Past Medical History:  Diagnosis Date   Allergy    Arthritis    BPH (benign prostatic hyperplasia) 2010   Brain tumor [facility])    Hearing problem    Hyperlipidemia  Hypertension    Inner ear dysfunction     Past Surgical History:  Procedure Laterality Date   BRAIN TUMOR EXCISION     NERVE GRAFT     TONSILLECTOMY  1951   TOTAL HIP ARTHROPLASTY     right-2002 and left-2004, 2010    Current Outpatient Medications  Medication Sig Dispense Refill   apixaban (ELIQUIS) 5 MG TABS tablet Take 2 tablets (10 mg total) by mouth 2 (two) times daily. Advised to  take Eliquis 10 mg(2 tablets) twice a day for 7 days followed by Eliquis 5 mg(1 tablet) twice a day for next 3 to 6 months. 60 tablet 6   apixaban (ELIQUIS) 5 MG TABS tablet Take 1 tablet (5 mg total) by mouth 2 (two) times daily. 180 tablet 0   Cyanocobalamin (VITAMIN B-12 IJ) Inject 1,000 mcg as directed every 30 (thirty) days.     finasteride (PROSCAR) 5 MG tablet Take 1 tablet (5 mg total) by mouth daily. 30 tablet 1   fluticasone (FLONASE) 50 MCG/ACT nasal spray Place 1-2 sprays into both nostrils daily as needed for allergies.  2   hydrALAZINE (APRESOLINE) 25 MG tablet Take 1 tablet (25 mg total) by mouth every 8 (eight) hours. 90 tablet 1   tamsulosin (FLOMAX) 0.4 MG CAPS capsule Take 1 capsule (0.4 mg total) by mouth daily. 30 capsule 1   No current facility-administered medications for this visit.    Allergies as of 06/30/2021 - Review Complete 06/30/2021  Allergen Reaction Noted   Bee venom Anaphylaxis 12/30/2015    Family History  Problem Relation Age of Onset   Uterine cancer Mother    Rheum arthritis Mother    Hypertension Father    Throat cancer Father        smoker   Arthritis Other    Heart disease Other    Hypertension Other    Stroke Other    Early death Other    Mental illness Other    Heart attack Brother        smoker   Heart attack Maternal Grandfather    Hypertension Brother    Stroke Brother    Diabetes Neg Hx    Hyperlipidemia Neg Hx       Physical Exam: General:   Alert,  well-nourished, pleasant and cooperative in NAD. Hard of healing.  Head:  Normocephalic and atraumatic. Eyes:  Sclera clear, no icterus.   Conjunctiva pink. Abdomen:  Soft, nontender, nondistended, normal bowel sounds, no rebound or guarding. No hepatosplenomegaly.  No inguinal or umbilical lymphadenopathy. Neurologic:  Alert and  oriented x4;  grossly nonfocal Skin:  Intact without significant lesions or rashes. Psych:  Alert and cooperative. Normal mood and affect.   I  spent over 30 minutes, including in depth chart review, independent review of results, face-to-face time with the patient, coordinating care, and ordering studies and medications as appropriate, and documentation.   Samuel Hartis L. Tarri Glenn, MD, MPH 06/30/2021, 9:50 AM

## 2021-06-30 NOTE — Patient Instructions (Addendum)
It was a pleasure to see you today.  I have recommended labs today to recheck your anemia and to look for iron deficiency. Please stop in the lab on your way out today. I recommend taking iron supplements every other day. If your labs show persistent anemia, I may recommend that you see a hematologist to help [place] figure out what else we can do to help your anemia.   I recommend a colonoscopy when your doctors feel it is safe to be off the Eliquis prior to the procedure.  Please let me know if you had any additional bleeding prior to that time.  Recent bloody diarrhea: Isolated symptoms make infectious etiologies most likely.  Thankfully, he has had no further bleeding since starting Eliquis.  Thankfully, he has had no further bleeding since starting Eliquis.  I recommend a colonoscopy when he is able to hold the Eliquis in preparation for endoscopic evaluation.  I recommend colonoscopy when he is able to hold his Eliquis prior to the procedure.  The prior colonoscopy reports in care everywhere.  The [facility] providers would like to encourage you to use [facility] to communicate with providers for non-urgent requests or questions.  Due to long hold times on the telephone, sending your provider a message by [facility] may be faster and more efficient way to get a response. Please allow 48 business hours for a response.  Please remember that this is for non-urgent requests/questions.  If you are age 74 or older, your body mass index should be between 23-30. Your Body mass index is 27.06 kg/m. If this is out of the aforementioned range listed, please consider follow up with your Primary Care Provider.

## 2021-07-05 ENCOUNTER — Telehealth: Payer: Self-pay | Admitting: Internal Medicine

## 2021-07-05 NOTE — Telephone Encounter (Signed)
LVM for pt to rtn my call to schedule AWV with [facility]. Please schedule AWV with [facility] if pt calls the office.

## 2021-07-09 DIAGNOSIS — R338 Other retention of urine: Secondary | ICD-10-CM | POA: Diagnosis not present

## 2021-07-10 ENCOUNTER — Other Ambulatory Visit: Payer: Self-pay

## 2021-07-10 ENCOUNTER — Encounter (HOSPITAL_COMMUNITY): Payer: Self-pay

## 2021-07-10 ENCOUNTER — Observation Stay (HOSPITAL_COMMUNITY)
Admission: EM | Admit: 2021-07-10 | Discharge: 2021-07-11 | Disposition: A | Discharge status: Home / Self Care | Payer: Medicare Other | Attending: Internal Medicine | Admitting: Internal Medicine

## 2021-07-10 DIAGNOSIS — Z86718 Personal history of other venous thrombosis and embolism: Secondary | ICD-10-CM | POA: Insufficient documentation

## 2021-07-10 DIAGNOSIS — I82402 Acute embolism and thrombosis of unspecified deep veins of left lower extremity: Secondary | ICD-10-CM | POA: Diagnosis present

## 2021-07-10 DIAGNOSIS — R339 Retention of urine, unspecified: Secondary | ICD-10-CM | POA: Diagnosis present

## 2021-07-10 DIAGNOSIS — Z79899 Other long term (current) drug therapy: Secondary | ICD-10-CM | POA: Diagnosis not present

## 2021-07-10 DIAGNOSIS — R310 Gross hematuria: Secondary | ICD-10-CM | POA: Insufficient documentation

## 2021-07-10 DIAGNOSIS — Z20822 Contact with and (suspected) exposure to covid-19: Secondary | ICD-10-CM | POA: Insufficient documentation

## 2021-07-10 DIAGNOSIS — Z7901 Long term (current) use of anticoagulants: Secondary | ICD-10-CM | POA: Insufficient documentation

## 2021-07-10 DIAGNOSIS — D3610 Benign neoplasm of peripheral nerves and autonomic nervous system, unspecified: Secondary | ICD-10-CM | POA: Diagnosis present

## 2021-07-10 DIAGNOSIS — N401 Enlarged prostate with lower urinary tract symptoms: Principal | ICD-10-CM | POA: Insufficient documentation

## 2021-07-10 DIAGNOSIS — R338 Other retention of urine: Secondary | ICD-10-CM | POA: Diagnosis present

## 2021-07-10 DIAGNOSIS — Z87891 Personal history of nicotine dependence: Secondary | ICD-10-CM | POA: Insufficient documentation

## 2021-07-10 DIAGNOSIS — Z96641 Presence of right artificial hip joint: Secondary | ICD-10-CM | POA: Diagnosis not present

## 2021-07-10 DIAGNOSIS — R319 Hematuria, unspecified: Secondary | ICD-10-CM | POA: Diagnosis present

## 2021-07-10 DIAGNOSIS — G510 Bell's palsy: Secondary | ICD-10-CM

## 2021-07-10 DIAGNOSIS — N400 Enlarged prostate without lower urinary tract symptoms: Secondary | ICD-10-CM | POA: Diagnosis present

## 2021-07-10 DIAGNOSIS — I10 Essential (primary) hypertension: Secondary | ICD-10-CM | POA: Diagnosis not present

## 2021-07-10 LAB — COMPREHENSIVE METABOLIC PANEL
ALT: 17 U/L (ref 0–44)
AST: 19 U/L (ref 15–41)
Albumin: 4.2 g/dL (ref 3.5–5.0)
Alkaline Phosphatase: 54 U/L (ref 38–126)
Anion gap: 9 (ref 5–15)
BUN: 22 mg/dL (ref 8–23)
CO2: 22 mmol/L (ref 22–32)
Calcium: 9.4 mg/dL (ref 8.9–10.3)
Chloride: 103 mmol/L (ref 98–111)
Creatinine, Ser: 1.6 mg/dL — ABNORMAL HIGH (ref 0.61–1.24)
GFR, Estimated: 45 mL/min — ABNORMAL LOW (ref >60)
Glucose, Bld: 116 mg/dL — ABNORMAL HIGH (ref 70–99)
Potassium: 3.8 mmol/L (ref 3.5–5.1)
Sodium: 134 mmol/L — ABNORMAL LOW (ref 135–145)
Total Bilirubin: 0.9 mg/dL (ref 0.3–1.2)
Total Protein: 6.9 g/dL (ref 6.5–8.1)

## 2021-07-10 LAB — CBC WITH DIFFERENTIAL/PLATELET
Abs Immature Granulocytes: 0.02 10*3/uL (ref 0.00–0.07)
Basophils Absolute: 0 10*3/uL (ref 0.0–0.1)
Basophils Relative: 0 %
Eosinophils Absolute: 0.1 10*3/uL (ref 0.0–0.5)
Eosinophils Relative: 1 %
HCT: 32.1 % — ABNORMAL LOW (ref 39.0–52.0)
Hemoglobin: 10.9 g/dL — ABNORMAL LOW (ref 13.0–17.0)
Immature Granulocytes: 0 %
Lymphocytes Relative: 10 %
Lymphs Abs: 0.8 10*3/uL (ref 0.7–4.0)
MCH: 32.6 pg (ref 26.0–34.0)
MCHC: 34 g/dL (ref 30.0–36.0)
MCV: 96.1 fL (ref 80.0–100.0)
Monocytes Absolute: 0.9 10*3/uL (ref 0.1–1.0)
Monocytes Relative: 11 %
Neutro Abs: 6 10*3/uL (ref 1.7–7.7)
Neutrophils Relative %: 78 %
Platelets: 193 10*3/uL (ref 150–400)
RBC: 3.34 MIL/uL — ABNORMAL LOW (ref 4.22–5.81)
RDW: 12.9 % (ref 11.5–15.5)
WBC: 7.8 10*3/uL (ref 4.0–10.5)
nRBC: 0 % (ref 0.0–0.2)

## 2021-07-10 LAB — RESP PANEL BY RT-PCR (FLU A&B, COVID) ARPGX2
Influenza A by PCR: NEGATIVE
Influenza B by PCR: NEGATIVE
SARS Coronavirus 2 by RT PCR: NEGATIVE

## 2021-07-10 MED ORDER — FINASTERIDE 5 MG PO TABS
5.0000 mg | ORAL_TABLET | Freq: Every day | ORAL | Status: DC
  Administered 2021-07-11: 5 mg via ORAL
  Filled 2021-07-10: qty 1

## 2021-07-10 MED ORDER — TAMSULOSIN HCL 0.4 MG PO CAPS
0.4000 mg | ORAL_CAPSULE | Freq: Every day | ORAL | Status: DC
  Administered 2021-07-11: 0.4 mg via ORAL
  Filled 2021-07-10: qty 1

## 2021-07-10 MED ORDER — POLYETHYLENE GLYCOL 3350 17 G PO PACK: 17.0000 g | PACK | Freq: Every day | ORAL | Status: DC | PRN

## 2021-07-10 MED ORDER — HYDRALAZINE HCL 25 MG PO TABS
25.0000 mg | ORAL_TABLET | Freq: Three times a day (TID) | ORAL | Status: DC
  Administered 2021-07-10 – 2021-07-11 (×3): 25 mg via ORAL
  Filled 2021-07-10 (×3): qty 1

## 2021-07-10 MED ORDER — FENTANYL CITRATE PF 50 MCG/ML IJ SOSY
50.0000 ug | PREFILLED_SYRINGE | Freq: Once | INTRAMUSCULAR | Status: AC
  Administered 2021-07-10: 50 ug via INTRAVENOUS
  Filled 2021-07-10: qty 1

## 2021-07-10 MED ORDER — SODIUM CHLORIDE 0.9 % IR SOLN
3000.0000 mL | Status: DC
  Administered 2021-07-10 – 2021-07-11 (×4): 3000 mL

## 2021-07-10 MED ORDER — ONDANSETRON HCL 4 MG/2ML IJ SOLN: 4.0000 mg | Freq: Four times a day (QID) | INTRAMUSCULAR | Status: DC | PRN

## 2021-07-10 MED ORDER — ACETAMINOPHEN 650 MG RE SUPP: 650.0000 mg | Freq: Four times a day (QID) | RECTAL | Status: DC | PRN

## 2021-07-10 MED ORDER — ONDANSETRON HCL 4 MG PO TABS: 4.0000 mg | ORAL_TABLET | Freq: Four times a day (QID) | ORAL | Status: DC | PRN

## 2021-07-10 MED ORDER — OXYCODONE HCL 5 MG PO TABS
5.0000 mg | ORAL_TABLET | ORAL | Status: DC | PRN
  Administered 2021-07-10 – 2021-07-11 (×3): 5 mg via ORAL
  Filled 2021-07-10 (×3): qty 1

## 2021-07-10 MED ORDER — ACETAMINOPHEN 325 MG PO TABS: 650.0000 mg | ORAL_TABLET | Freq: Four times a day (QID) | ORAL | Status: DC | PRN

## 2021-07-10 NOTE — ED Notes (Addendum)
22French 3-way foley ordered by Curly Rim. PA-C notified that pt has a difficult prostate "twice the normal size" per the pt and he has a past hx of "severe bladder trauma injury." This nurse discussed with PA-C that attempt with 22French would likely be unsuccessful and requested Uro to be paged for catheter placement. Will await further orders at this time.

## 2021-07-10 NOTE — ED Notes (Signed)
Per PA-C's order, pt to receive 3rd, 3L bag of NS bladder irrigant at this time via 3-way foley that remains in place, placed by Urology previously. 300cc's dark, bloody urine emptied from pt's foley bag at this time.

## 2021-07-10 NOTE — ED Notes (Signed)
Verbal order for 22 french 3 way catheter by [place], [place].

## 2021-07-10 NOTE — ED Notes (Signed)
Continuous bladder irrigation started by Urologist. 1500cc's Normal saline into bladder and 1500cc's bloody urine return from foley bag, emptied at this time. Will continue to monitor.

## 2021-07-10 NOTE — ED Notes (Signed)
1250cc's bloody return drained from foley bag at this time. Pt has received 2 total 3L bags at this time. Continuous irrigation stopped, per Ovid Curd Ransom's PA-C's verbal order. Will continue to monitor.

## 2021-07-10 NOTE — ED Notes (Signed)
1850cc's of or bloody return drained from foley bag at this time.

## 2021-07-10 NOTE — ED Notes (Signed)
PA-C notified pt requesting pain medication. Will await further orders.

## 2021-07-10 NOTE — ED Notes (Signed)
Bladder scan revealed greater than 782mL.

## 2021-07-10 NOTE — ED Notes (Signed)
3rd 3L continuous bladder irrigation complete at this time. 3L total blood tinged urine returned.

## 2021-07-10 NOTE — ED Triage Notes (Signed)
Pt reports having a foley catheter x 1 month. Followed up with Urology yesterday and had it removed. Self caterizing at home. First couple times were fine. Pt woke up this morning with suprapubic pain and bright red blood with large clots. Unable to void.

## 2021-07-10 NOTE — H&P (Signed)
History and Physical    IRL BODIE CWC:[id number] DOB: July 15, 1945 DOA: 07/10/2021  PCP: [name], MD  Patient coming from: Home  I have personally briefly reviewed patient's old medical records in [place]  Chief Complaint: Abdominal distention, inability to urinate  HPI: Samuel Fisher is a 76 y.o. male with medical history significant of essential hypertension, BPH, hyperlipidemia, benign schwannoma with facial nerve palsy, recent left lower extremity DVT on Eliquis who presented to [facility] ED on 07/10/2021 with abdominal pain/distention and inability urinate.  Patient has required an indwelling Foley catheter for the past month, follows with Dr. Louis Meckel outpatient.  Patient was seen by urology on 07/09/2021 with a voiding trial with removal of his Foley catheter.  Patient was instructed to self catheterize himself 4 times daily which he initially was comfortable was able to perform.  Earlier this morning, patient felt urge to void with associated abdominal discomfort and distention and attempted self-catheterization x2 which was unsuccessful with return of small amount of blood with clot.  Given his inability to catheterize himself, patient presented to ED for further evaluation.  ED Course: Temperature 97.9 F, HR 83, RR 13, BP 146/68, SPO2 100% on room air.  WBC 7.8, hemoglobin 10.9, platelets 193.  Sodium 134, potassium 3.8, chloride 103, CO2 22, BUN 22, creatinine 1.60, glucose 116.  Urology was consulted, started on complete bladder irrigation.  Given patient's persistence with hematuria, urology recommended medical admission for further evaluation and management with continued bladder irrigation.  Review of Systems: Constitutional - No Fatigue, No Weight Loss Vision - No impaired vision, decreased visual acuity Ear/Nose/Mouth/Throat - No decreased hearing, no congestion Respiratory - No shortness of breath, no exertional dyspnea, chronic cough Cardiovascular - No  chest pain, no palpitations, no peripheral edema Gastrointestinal - No nausea, no diarrhea, no constipation, + abdominal pain/discomfort Genitourinary - + inability to void, no excessive urination, no urinary incontinence Integumentary - No rashes or concerning skin lesions Neurologic - No numbness, no tingling, no dizziness, no headaches, no confusion or memory loss   Past Medical History:  Diagnosis Date   Allergy    Arthritis    BPH (benign prostatic hyperplasia) 2010   Brain tumor ([place])    Hearing problem    Hyperlipidemia    Hypertension    Inner ear dysfunction     Past Surgical History:  Procedure Laterality Date   BRAIN TUMOR [place]     right-2002 and left-2004, 2010     reports that he quit smoking about 45 years ago. His smoking use included cigarettes. He has never used smokeless tobacco. He reports current alcohol use of about 3.0 standard drinks per week. He reports that he does not use drugs.  Allergies  Allergen Reactions   Bee Venom Anaphylaxis    Allergic to Bee Sting -unknown reaction    Family History  Problem Relation Age of Onset   Uterine cancer Mother    Rheum arthritis Mother    Hypertension Father    Throat cancer Father        smoker   Arthritis Other    Heart disease Other    Hypertension Other    Stroke Other    Early death Other    Mental illness Other    Heart attack Brother        smoker   Heart attack Maternal Grandfather  Hypertension Brother    Stroke Brother    Diabetes Neg Hx    Hyperlipidemia Neg Hx     Family history reviewed and not pertinent   Prior to Admission medications   Medication Sig Start Date End Date Taking? Authorizing Provider  apixaban (ELIQUIS) 5 MG TABS tablet Take 2 tablets (10 mg total) by mouth 2 (two) times daily. Advised to take Eliquis 10 mg(2 tablets) twice a day for 7 days followed by Eliquis 5 mg(1 tablet) twice a day for  next 3 to 6 months. 06/12/21   Shawna Clamp, MD  apixaban (ELIQUIS) 5 MG TABS tablet Take 1 tablet (5 mg total) by mouth 2 (two) times daily. 06/18/21   Hoyt Koch, MD  Cyanocobalamin (VITAMIN B-12 IJ) Inject 1,000 mcg as directed every 30 (thirty) days.    [provider]  finasteride (PROSCAR) 5 MG tablet Take 1 tablet (5 mg total) by mouth daily. 06/13/21   Shawna Clamp, MD  fluticasone (FLONASE) 50 MCG/ACT nasal spray Place 1-2 sprays into both nostrils daily as needed for allergies. 12/03/15   [provider]  hydrALAZINE (APRESOLINE) 25 MG tablet Take 1 tablet (25 mg total) by mouth every 8 (eight) hours. 06/12/21   Shawna Clamp, MD  tamsulosin (FLOMAX) 0.4 MG CAPS capsule Take 1 capsule (0.4 mg total) by mouth daily. 06/13/21   Shawna Clamp, MD    Physical Exam: Vitals:   07/10/21 1345 07/10/21 1400 07/10/21 1415 07/10/21 1430  BP: 136/80 (!) 147/84 (!) 156/87 (!) 146/68  Pulse: 98 84 (!) 102 83  Resp: 12 15 19 13   Temp:      TempSrc:      SpO2: 99% 99% 100% 100%  Weight:      Height:        Constitutional: NAD, calm, comfortable Eyes: PERRL, lids and conjunctivae normal ENMT: Mucous membranes are moist. Posterior pharynx clear of any exudate or lesions.Normal dentition.  Neck: normal, supple, no masses, no thyromegaly Respiratory: clear to auscultation bilaterally, no wheezing, no crackles. Normal respiratory effort. No accessory muscle use.  Cardiovascular: Regular rate and rhythm, no murmurs / rubs / gallops. No extremity edema. 2+ pedal pulses. No carotid bruits.  Abdomen: no tenderness, no masses palpated. No hepatosplenomegaly. Bowel sounds positive.  GU: Foley catheter noted, on CBI with pink-tinged urine in collection bag Musculoskeletal: no clubbing / cyanosis. No joint deformity upper and lower extremities. Good ROM, no contractures. Normal muscle tone.  Skin: no rashes, lesions, ulcers. No induration Neurologic: Right-sided facial  droop noted secondary to facial nerve palsy, otherwise CN 2-12 grossly intact. Sensation intact, DTR normal. Strength 5/5 in all 4.  Psychiatric: Normal judgment and insight. Alert and oriented x 3. Normal mood.    Labs on Admission: I have personally reviewed following labs and imaging studies  CBC: Recent Labs  Lab 07/10/21 0755  WBC 7.8  NEUTROABS 6.0  HGB 10.9*  HCT 32.1*  MCV 96.1  PLT 892   Basic Metabolic Panel: Recent Labs  Lab 07/10/21 0755  NA 134*  K 3.8  CL 103  CO2 22  GLUCOSE 116*  BUN 22  CREATININE 1.60*  CALCIUM 9.4   GFR: Estimated Creatinine Clearance: 39.8 mL/min (A) (by C-G formula based on SCr of 1.6 mg/dL (H)). Liver Function Tests: Recent Labs  Lab 07/10/21 0755  AST 19  ALT 17  ALKPHOS 54  BILITOT 0.9  PROT 6.9  ALBUMIN 4.2   No results for input(s): LIPASE, AMYLASE in the last  168 hours. No results for input(s): AMMONIA in the last 168 hours. Coagulation Profile: No results for input(s): INR, PROTIME in the last 168 hours. Cardiac Enzymes: No results for input(s): CKTOTAL, CKMB, CKMBINDEX, TROPONINI in the last 168 hours. BNP (last 3 results) No results for input(s): PROBNP in the last 8760 hours. HbA1C: No results for input(s): HGBA1C in the last 72 hours. CBG: No results for input(s): GLUCAP in the last 168 hours. Lipid Profile: No results for input(s): CHOL, HDL, LDLCALC, TRIG, CHOLHDL, LDLDIRECT in the last 72 hours. Thyroid Function Tests: No results for input(s): TSH, T4TOTAL, FREET4, T3FREE, THYROIDAB in the last 72 hours. Anemia Panel: No results for input(s): VITAMINB12, FOLATE, FERRITIN, TIBC, IRON, RETICCTPCT in the last 72 hours. Urine analysis:    Component Value Date/Time   COLORURINE STRAW (A) 06/10/2021 0148   APPEARANCEUR CLEAR 06/10/2021 0148   LABSPEC 1.006 06/10/2021 0148   PHURINE 5.0 06/10/2021 0148   GLUCOSEU NEGATIVE 06/10/2021 0148   GLUCOSEU NEGATIVE 01/20/2021 0931   HGBUR SMALL (A) 06/10/2021  0148   BILIRUBINUR NEGATIVE 06/10/2021 0148   KETONESUR NEGATIVE 06/10/2021 0148   PROTEINUR NEGATIVE 06/10/2021 0148   UROBILINOGEN 0.2 01/20/2021 0931   NITRITE NEGATIVE 06/10/2021 0148   LEUKOCYTESUR NEGATIVE 06/10/2021 0148    Radiological Exams on Admission: No results found.  EKG: Independently reviewed.   Assessment/Plan Principal Problem:   Acute urinary retention Active Problems:   BPH (benign prostatic hyperplasia)   Benign schwannoma   Facial nerve palsy   Hematuria   Deep vein thrombosis (DVT) of left lower extremity (HCC)   Acute urinary retention secondary to BPH Hematuria Patient presenting to the ED with abdominal pain/distention with inability to urinate or self catheterize himself at home this morning.  Patient attempted to self catheterizations with only return of blood and few clots.  Was seen by urologist morning, Foley catheter was placed and patient maintains on complete bladder irrigation.  Given persistent hematuria, urology requesting medical admission.  Hemoglobin 10.9 on admission. --Continue CBI --Continue home finasteride and tamsulosin --Follow CBC daily --Further per urology  Hx DVT left peroneal vein  Patient with recent left lower extremity DVT to left peroneal vein noted on vascular duplex ultrasound 06/11/2021.  Has been on Eliquis since.  No further edema.  No previous history of PE/DVT, etiology of thought to be secondary to trauma after patient twisted his ankle. --Repeat vascular duplex ultrasound to assess resolution of DVT given his hematuria and currently on anticoagulation with Eliquis. --Hold Eliquis for now  Essential hypertension --Continue hydralazine 25 mg p.o. every 8 hours  BPH --Finasteride --Tamsulosin 0.4 mg p.o. daily  Hx benign schwannoma with facial nerve palsy --Stable     DVT prophylaxis: SCDs, holding chemical DVT prophylaxis/home Eliquis in the setting of hematuria Code Status: Full code Family  Communication: No family at bedside Disposition Plan: Anticipate discharge home when medically ready and urology signed off Consults called: Urology, Dr. Lovena Neighbours Admission status: Inpatient Level of care: Med-Surg   Severity of Illness: The appropriate patient status for this patient is INPATIENT. Inpatient status is judged to be reasonable and necessary in order to provide the required intensity of service to ensure the patient's safety. The patient's presenting symptoms, physical exam findings, and initial radiographic and laboratory data in the context of their chronic comorbidities is felt to place them at high risk for further clinical deterioration. Furthermore, it is not anticipated that the patient will be medically stable for discharge from the hospital within 2  midnights of admission. The following factors support the patient status of inpatient.   " The patient's presenting symptoms include abdominal pain, no urinary output " The worrisome physical exam findings include abdominal distention " The initial radiographic and laboratory data are worrisome because of elevated creatinine " The chronic co-morbidities include BPH, essential hypertension, hyperlipidemia, history of obstructive uropathy, history of benign schwannoma.   * I certify that at the point of admission it is my clinical judgment that the patient will require inpatient hospital care spanning beyond 2 midnights from the point of admission due to high intensity of service, high risk for further deterioration and high frequency of surveillance required.*    Chaunda Vandergriff J [place] DO [facility] Available via Epic secure chat 7am-7pm After these hours, please refer to coverage provider listed on [email or web address] 07/10/2021, 4:06 PM

## 2021-07-10 NOTE — ED Provider Notes (Signed)
[facility] DEPT Provider Note   CSN: [id number] Arrival date & time: 07/10/21  0559     History Chief Complaint  Patient presents with   Urinary Retention    Samuel Fisher is a 76 y.o. male with history of BPH, hypertension, schwannoma in the brain, urinary retention presents the emergency department for evaluation of hematuria and urinary retention.  Patient reported that yesterday he got his indwelling catheter removed after being present for 1 month for urinary retention.  He reports that he woke up early this morning around 5:00am to self cath himself.  He reports he was not able to get any urine return, but only saw bright red clots.  He reports he attempted to self cath himself again and was able to relieve a small amount of bloody urine, but was still having the abdominal pain in sensation to urinate with no success.  The patient currently is on Eliquis for DVT seen at the end of December.  He denies any fevers or lightheadedness.  Denies any dark or tarry stools.  Denies any rashes.  [facility] for his urologist practice.  HPI     Home Medications Prior to Admission medications   Medication Sig Start Date End Date Taking? Authorizing Provider  apixaban (ELIQUIS) 5 MG TABS tablet Take 2 tablets (10 mg total) by mouth 2 (two) times daily. Advised to take Eliquis 10 mg(2 tablets) twice a day for 7 days followed by Eliquis 5 mg(1 tablet) twice a day for next 3 to 6 months. 06/12/21   Shawna Clamp, MD  apixaban (ELIQUIS) 5 MG TABS tablet Take 1 tablet (5 mg total) by mouth 2 (two) times daily. 06/18/21   Hoyt Koch, MD  Cyanocobalamin (VITAMIN B-12 IJ) Inject 1,000 mcg as directed every 30 (thirty) days.    [provider]  finasteride (PROSCAR) 5 MG tablet Take 1 tablet (5 mg total) by mouth daily. 06/13/21   Shawna Clamp, MD  fluticasone (FLONASE) 50 MCG/ACT nasal spray Place 1-2 sprays into both nostrils daily as  needed for allergies. 12/03/15   [provider]  hydrALAZINE (APRESOLINE) 25 MG tablet Take 1 tablet (25 mg total) by mouth every 8 (eight) hours. 06/12/21   Shawna Clamp, MD  tamsulosin (FLOMAX) 0.4 MG CAPS capsule Take 1 capsule (0.4 mg total) by mouth daily. 06/13/21   Shawna Clamp, MD      Allergies    Bee venom    Review of Systems   Review of Systems  Constitutional:  Negative for fever.  Gastrointestinal:  Positive for abdominal distention.  Genitourinary:  Positive for difficulty urinating, enuresis and hematuria. Negative for flank pain, penile discharge, penile pain, penile swelling, scrotal swelling and testicular pain.  Neurological:  Negative for dizziness and light-headedness.   Physical Exam Updated Vital Signs BP (!) 141/82    Pulse 75    Temp 98.4 F (36.9 C) (Oral)    Resp 20    Ht 5\' 8"  (1.727 m)    Wt 81.6 kg    SpO2 95%    BMI 27.37 kg/m  Physical Exam Vitals and nursing note reviewed.  Constitutional:      General: He is not in acute distress.    Appearance: Normal appearance. He is not toxic-appearing.     Comments: Uncomfortable appearing, but not toxic  HENT:     Head: Normocephalic and atraumatic.  Eyes:     General: No scleral icterus. Cardiovascular:     Rate and  Rhythm: Normal rate and regular rhythm.  Pulmonary:     Effort: Pulmonary effort is normal.     Breath sounds: Normal breath sounds.  Abdominal:     General: Bowel sounds are normal. There is distension.     Palpations: Abdomen is soft.     Tenderness: There is abdominal tenderness. There is no guarding or rebound.     Comments: Some abdominal distention with lower abdominal tenderness.  Likely due to bladder distention.  Musculoskeletal:        General: No deformity.     Cervical back: Normal range of motion.  Skin:    General: Skin is warm and dry.     Findings: No rash.  Neurological:     Mental Status: He is alert. Mental status is at baseline.     Comments: The  patient has a known right facial droop at baseline due to a schwannoma.    ED Results / Procedures / Treatments   Labs (all labs ordered are listed, but only abnormal results are displayed) Labs Reviewed  CBC WITH DIFFERENTIAL/PLATELET  URINALYSIS, ROUTINE W REFLEX MICROSCOPIC  COMPREHENSIVE METABOLIC PANEL    EKG None  Radiology No results found.  Procedures Procedures   Medications Ordered in ED Medications  sodium chloride irrigation 0.9 % 3,000 mL (has no administration in time range)    ED Course/ Medical Decision Making/ A&P                           Medical Decision Making Amount and/or Complexity of Data Reviewed Labs: ordered.  Risk Prescription drug management. Decision regarding hospitalization.   76 year old male on Eliquis for DVT presents the emergency department for evaluation of clots after self cathing as well as urinary retention and gross hematuria today.  Differential diagnosis includes was not limited to UTI, interstitial cystitis, renal stone, BPH, malignancy, TTP.  Vital signs show hypertension although patient is afebrile, normal pulse rate, and satting well on room air.  Physical exam noted for a tender distended abdomen with no overlying skin changes.  Normal active bowel sounds.  Regular rate and rhythm.  Lungs clear to auscultation bilaterally.  Patient is uncomfortable looking, but not toxic appearing.  Labs ordered.  Will need bladder irrigation.  Bladder scan revealed greater than 760 mL of urine.  After 2 unsuccessful attempts by nursing staff to insert catheter, urology was paged.  Dr. Lovena Neighbours returned page promptly and discussed me that he would evaluate patient at bedside.  In the meantime, ordered patient fentanyl for pain.  Dr. Lovena Neighbours assessed at bedside and was able to insert the catheter.  He recommended bladder irrigation with 3 bags of fluids and wanted the urine without irrigation to be a lighter clear or red color is not completely  back to normal.  On reevaluation, the patient reports he is feeling much better after the catheterization in the pressure-relief in his bladder.  He reports he is "sitting comfortably".  BMP shows slightly decreased sodium 134.  Decreased bicarb at 21.  Elevated glucose at 106.  Elevated creatinine at 1.6 which is actually improved from patient's previous labs.  CBC shows hemoglobin of 10.9 which is improved from patient's previous labs at 10.1.  Normal platelets at 193.  Negative for COVID and flu.  The patient does not have any rashes visualized.  He does not have any CVA tenderness.  Since the patient recently had a catheter in yesterday and is now having  hematuria with decreased urination, this is likely due to bladder trauma and enlarged prostate.  After multiple reevaluations, the urine persistently was a darker concentrated red.  I called Dr. Gilford Rile again and discussed the results after the copious mounts of bladder irrigation.  He recommended admit to medicine and to continue with the bladder irrigation.  Nursing staff aware. Admit to Dr. Eric [place]  I discussed this case with my attending physician who cosigned this note including patient's presenting symptoms, physical exam, and planned diagnostics and interventions. Attending physician stated agreement with plan or made changes to plan which were implemented.   Attending physician assessed patient at bedside.  Final Clinical Impression(s) / ED Diagnoses Final diagnoses:  Gross hematuria    Rx / DC Orders ED Discharge Orders     None         Sherrell Puller, Hershal Coria 07/11/21 [place], [place], MD 07/11/21 2321

## 2021-07-10 NOTE — ED Notes (Signed)
Uro at the bedside.

## 2021-07-10 NOTE — ED Notes (Signed)
1400cc's bloody return emptied from foley bag at this time.

## 2021-07-10 NOTE — ED Notes (Signed)
Per face-to-face verbal order from Sherrell Puller, PA-C., pt to receive fourth, 3L bag of NS continuous bladder irrigation, hung at this time.

## 2021-07-10 NOTE — ED Notes (Signed)
PA-C back at the bedside to re-evaluate 

## 2021-07-10 NOTE — ED Notes (Addendum)
One attempt to insert foley catheter per order without success. PA aware.

## 2021-07-10 NOTE — Consult Note (Addendum)
Urology Consult   Physician requesting consult: Varney Biles, MD  Reason for consult: Difficult Foley catheter  History of Present Illness: Samuel Fisher is a 76 y.o. male seen in the emergency department due to acute urinary retention.  The patient is currently followed by Dr. Louis Meckel and has required an indwelling Foley catheter for the past several weeks.  He had a voiding trial in the office yesterday which he failed.  He was given clean intermittent catheterization instructions, but states that when he tried to catheterize himself at home, he had no output other than blood and a few clots.  He currently reports significant lower abdominal pain and a strong urge to urinate.  The nursing staff made multiple attempts to place a "Foley catheter, but was unsuccessful.  He is currently on Eliquis due to a lower extremity DVT.   Past Medical History:  Diagnosis Date   Allergy    Arthritis    BPH (benign prostatic hyperplasia) 2010   Brain tumor ([place])    Hearing problem    Hyperlipidemia    Hypertension    Inner ear dysfunction     Past Surgical History:  Procedure Laterality Date   BRAIN TUMOR EXCISION     NERVE GRAFT     TONSILLECTOMY  1951   TOTAL HIP ARTHROPLASTY     right-2002 and left-2004, 2010    Current Hospital Medications:  Home Meds:  No outpatient medications have been marked as taking for the 07/10/21 encounter [facility] Encounter).    Scheduled Meds: Continuous Infusions:  sodium chloride irrigation     PRN Meds:.  Allergies:  Allergies  Allergen Reactions   Bee Venom Anaphylaxis    Allergic to Bee Sting -unknown reaction    Family History  Problem Relation Age of Onset   Uterine cancer Mother    Rheum arthritis Mother    Hypertension Father    Throat cancer Father        smoker   Arthritis Other    Heart disease Other    Hypertension Other    Stroke Other    Early death Other    Mental illness Other    Heart attack Brother         smoker   Heart attack Maternal Grandfather    Hypertension Brother    Stroke Brother    Diabetes Neg Hx    Hyperlipidemia Neg Hx     Social History:  reports that he quit smoking about 45 years ago. His smoking use included cigarettes. He has never used smokeless tobacco. He reports current alcohol use of about 3.0 standard drinks per week. He reports that he does not use drugs.  ROS: A complete review of systems was performed.  All systems are negative except for pertinent findings as noted.  Physical Exam:  Vital signs in last 24 hours: Temp:  [97.9 F (36.6 C)-98.4 F (36.9 C)] 97.9 F (36.6 C) (01/21 0802) Pulse Rate:  [72-91] 78 (01/21 0845) Resp:  [13-20] 17 (01/21 0845) BP: (113-159)/(65-90) 113/65 (01/21 0845) SpO2:  [95 %-100 %] 98 % (01/21 0845) Weight:  [81.6 kg] 81.6 kg (01/21 5397) Constitutional:  Alert and oriented, No acute distress Cardiovascular: Regular rate and rhythm, No JVD Respiratory: Normal respiratory effort, Lungs clear bilaterally GI: Suprapubic discomfort with palpation  GU: Blood at the urethral meatus Lymphatic: No lymphadenopathy Neurologic: Grossly intact, no focal deficits Psychiatric: Normal mood and affect  Laboratory Data:  Recent Labs    07/10/21 0755  WBC  7.8  HGB 10.9*  HCT 32.1*  PLT 193    Recent Labs    07/10/21 0755  NA 134*  K 3.8  CL 103  GLUCOSE 116*  BUN 22  CALCIUM 9.4  CREATININE 1.60*     Results for orders placed or performed during the hospital encounter of 07/10/21 (from the past 24 hour(s))  CBC with Differential     Status: Abnormal   Collection Time: 07/10/21  7:55 AM  Result Value Ref Range   WBC 7.8 4.0 - 10.5 K/uL   RBC 3.34 (L) 4.22 - 5.81 MIL/uL   Hemoglobin 10.9 (L) 13.0 - 17.0 g/dL   HCT 32.1 (L) 39.0 - 52.0 %   MCV 96.1 80.0 - 100.0 fL   MCH 32.6 26.0 - 34.0 pg   MCHC 34.0 30.0 - 36.0 g/dL   RDW 12.9 11.5 - 15.5 %   Platelets 193 150 - 400 K/uL   nRBC 0.0 0.0 - 0.2 %   Neutrophils  Relative % 78 %   Neutro Abs 6.0 1.7 - 7.7 K/uL   Lymphocytes Relative 10 %   Lymphs Abs 0.8 0.7 - 4.0 K/uL   Monocytes Relative 11 %   Monocytes Absolute 0.9 0.1 - 1.0 K/uL   Eosinophils Relative 1 %   Eosinophils Absolute 0.1 0.0 - 0.5 K/uL   Basophils Relative 0 %   Basophils Absolute 0.0 0.0 - 0.1 K/uL   Immature Granulocytes 0 %   Abs Immature Granulocytes 0.02 0.00 - 0.07 K/uL  Comprehensive metabolic panel     Status: Abnormal   Collection Time: 07/10/21  7:55 AM  Result Value Ref Range   Sodium 134 (L) 135 - 145 mmol/L   Potassium 3.8 3.5 - 5.1 mmol/L   Chloride 103 98 - 111 mmol/L   CO2 22 22 - 32 mmol/L   Glucose, Bld 116 (H) 70 - 99 mg/dL   BUN 22 8 - 23 mg/dL   Creatinine, Ser 1.60 (H) 0.61 - 1.24 mg/dL   Calcium 9.4 8.9 - 10.3 mg/dL   Total Protein 6.9 6.5 - 8.1 g/dL   Albumin 4.2 3.5 - 5.0 g/dL   AST 19 15 - 41 U/L   ALT 17 0 - 44 U/L   Alkaline Phosphatase 54 38 - 126 U/L   Total Bilirubin 0.9 0.3 - 1.2 mg/dL   GFR, Estimated 45 (L) >60 mL/min   Anion gap 9 5 - 15   No results found for this or any previous visit (from the past 240 hour(s)).  Renal Function: Recent Labs    07/10/21 0755  CREATININE 1.60*   Estimated Creatinine Clearance: 39.8 mL/min (A) (by C-G formula based on SCr of 1.6 mg/dL (H)).  Procedures: The patient was prepped and draped in the usual fashion.  A 22 French three-way Foley catheter was then inserted into the bladder with return of grossly bloody urine.  A total of 30 mL of sterile water was then used to inflate the catheter balloon.  Immediate 1500 mL of bloody urine output.  The Foley catheter was then extensively hand irrigated and found to be free of clots.  CBI was started and was running light pink on full flow.  Radiologic Imaging: No results found.  I independently reviewed the above imaging studies.  Impression/Recommendation 76 year old male with BPH with urinary retention and gross hematuria.  Currently on Eliquis  due to a lower extremity DVT  -22 French three-way Foley catheter placed with approximately 1500 mL of  immediate bloody urine output -Continue CBI.  If urine is clear to light pink without CBI.  Okay to discharge home with Foley catheter.  I will alert Dr. Louis Meckel and arrange outpatient follow-up.  Please call my cell phone at [phone] with any questions or concerns.  Ellison Hughs, MD [facility] 07/10/2021, 9:48 AM

## 2021-07-10 NOTE — ED Notes (Signed)
Hospitalist at the bedside 

## 2021-07-10 NOTE — ED Notes (Signed)
PA-C notified and aware that attempt with ordered 22French 3-way catheter was unsuccessful. PA-C to page Uro at this time.

## 2021-07-10 NOTE — ED Notes (Signed)
Uro en route at this time per PA-C.

## 2021-07-11 ENCOUNTER — Inpatient Hospital Stay (HOSPITAL_BASED_OUTPATIENT_CLINIC_OR_DEPARTMENT_OTHER): Payer: Medicare Other

## 2021-07-11 DIAGNOSIS — R338 Other retention of urine: Secondary | ICD-10-CM | POA: Diagnosis not present

## 2021-07-11 DIAGNOSIS — Z86718 Personal history of other venous thrombosis and embolism: Secondary | ICD-10-CM

## 2021-07-11 LAB — BASIC METABOLIC PANEL
Anion gap: 8 (ref 5–15)
BUN: 19 mg/dL (ref 8–23)
CO2: 21 mmol/L — ABNORMAL LOW (ref 22–32)
Calcium: 9 mg/dL (ref 8.9–10.3)
Chloride: 105 mmol/L (ref 98–111)
Creatinine, Ser: 1.38 mg/dL — ABNORMAL HIGH (ref 0.61–1.24)
GFR, Estimated: 54 mL/min — ABNORMAL LOW (ref >60)
Glucose, Bld: 106 mg/dL — ABNORMAL HIGH (ref 70–99)
Potassium: 3.6 mmol/L (ref 3.5–5.1)
Sodium: 134 mmol/L — ABNORMAL LOW (ref 135–145)

## 2021-07-11 LAB — CBC
HCT: 28 % — ABNORMAL LOW (ref 39.0–52.0)
Hemoglobin: 9.2 g/dL — ABNORMAL LOW (ref 13.0–17.0)
MCH: 32.3 pg (ref 26.0–34.0)
MCHC: 32.9 g/dL (ref 30.0–36.0)
MCV: 98.2 fL (ref 80.0–100.0)
Platelets: 161 10*3/uL (ref 150–400)
RBC: 2.85 MIL/uL — ABNORMAL LOW (ref 4.22–5.81)
RDW: 12.8 % (ref 11.5–15.5)
WBC: 8.6 10*3/uL (ref 4.0–10.5)
nRBC: 0 % (ref 0.0–0.2)

## 2021-07-11 MED ORDER — CHLORHEXIDINE GLUCONATE CLOTH 2 % EX PADS
6.0000 | MEDICATED_PAD | Freq: Every day | CUTANEOUS | Status: DC
  Administered 2021-07-11: 6 via TOPICAL

## 2021-07-11 NOTE — Care Management Obs Status (Signed)
[facility] NOTIFICATION   Patient Details  Name: AMON COSTILLA MRN: [id number] Date of Birth: 1946-05-10   Medicare Observation Status Notification Given:  No  Pt released prior to letter being given.    Ludwig Clarks, LCSW 07/11/2021, 2:29 PM

## 2021-07-11 NOTE — Progress Notes (Signed)
Left lower extremity venous duplex completed. Refer to "CV Proc" under chart review to view preliminary results.  07/11/2021 11:48 AM Kelby Aline., MHA, RVT, RDCS, RDMS

## 2021-07-11 NOTE — Progress Notes (Addendum)
Pt ambulated 2 laps in the hallway - independent after set-up.  Pt up in chair.  Will continue to monitor.

## 2021-07-11 NOTE — Discharge Summary (Signed)
Physician Discharge Summary  JATINDER MCDONAGH ONG:[id number] DOB: 02/24/1946 DOA: 07/10/2021  PCP: [name], MD  Admit date: 07/10/2021 Discharge date: 07/11/2021  Admitted From: Home Disposition: Home  Recommendations for Outpatient Follow-up:  Follow up with PCP in 1-2 weeks Follow-up with urology, Dr. Gorden Harms as scheduled Discontinued Eliquis with resolution of left lower extremity DVT on repeat ultrasound during hospitalization.  Home Health: No Equipment/Devices: Foley catheter  Discharge Condition: Stable CODE STATUS: Full code Diet recommendation: Heart healthy diet  History of present illness:  Samuel Fisher is a 76 y.o. male with medical history significant of essential hypertension, BPH, hyperlipidemia, benign schwannoma with facial nerve palsy, recent left lower extremity DVT on Eliquis who presented to [facility] ED on 07/10/2021 with abdominal pain/distention and inability urinate.  Patient has required an indwelling Foley catheter for the past month, follows with Dr. Louis Meckel outpatient.  Patient was seen by urology on 07/09/2021 with a voiding trial with removal of his Foley catheter.  Patient was instructed to self catheterize himself 4 times daily which he initially was comfortable was able to perform.  Earlier this morning, patient felt urge to void with associated abdominal discomfort and distention and attempted self-catheterization x2 which was unsuccessful with return of small amount of blood with clot.  Given his inability to catheterize himself, patient presented to ED for further evaluation.   In the ED, temperature 97.9 F, HR 83, RR 13, BP 146/68, SPO2 100% on room air.  WBC 7.8, hemoglobin 10.9, platelets 193.  Sodium 134, potassium 3.8, chloride 103, CO2 22, BUN 22, creatinine 1.60, glucose 116.  Urology was consulted, started on complete bladder irrigation.  Given patient's persistence with hematuria, urology recommended medical admission for further  evaluation and management with continued bladder irrigation.  Hospital course:  Acute urinary retention secondary to BPH Hematuria Patient presenting to the ED with abdominal pain/distention with inability to urinate or self catheterize himself at home this morning.  Patient attempted to self catheterizations with only return of blood and few clots.  Was seen by urologist morning, Foley catheter was placed and patient maintains on complete bladder irrigation.  Patient's hematuria improved and urology recommended discontinuing complete bladder irrigation and to maintain Foley catheter until follows up with urology, Dr. Louis Meckel outpatient.  Continue finasteride and tamsulosin.   Hx DVT left peroneal vein  Patient with recent left lower extremity DVT to left peroneal vein noted on vascular duplex ultrasound 06/11/2021.  Has been on Eliquis since.  No further edema.  No previous history of PE/DVT, etiology of thought to be secondary to trauma after patient twisted his ankle.  Repeat vascular duplex ultrasound left lower extremity shows resolution of his previous DVT.  Eliquis was discontinued in the setting of his hematuria and now resolution of DVT.   Essential hypertension Continue hydralazine 25 mg p.o. every 8 hours   BPH Continue finasteride and tamsulosin    Hx benign schwannoma with facial nerve palsy Stable   Discharge Diagnoses:  Principal Problem:   Acute urinary retention Active Problems:   BPH (benign prostatic hyperplasia)   Benign schwannoma   Facial nerve palsy   Hematuria    Discharge Instructions  Discharge Instructions     Call MD for:  difficulty breathing, headache or visual disturbances   Complete by: As directed    Call MD for:  extreme fatigue   Complete by: As directed    Call MD for:  persistant dizziness or light-headedness   Complete by: As directed  Call MD for:  persistant nausea and vomiting   Complete by: As directed    Call MD for:  severe  uncontrolled pain   Complete by: As directed    Call MD for:  temperature >100.4   Complete by: As directed    Diet - low sodium heart healthy   Complete by: As directed    Increase activity slowly   Complete by: As directed       Allergies as of 07/11/2021       Reactions   Bee Venom Anaphylaxis   Allergic to Bee Sting -unknown reaction        Medication List     STOP taking these medications    apixaban 5 MG Tabs tablet Commonly known as: ELIQUIS       TAKE these medications    finasteride 5 MG tablet Commonly known as: PROSCAR Take 1 tablet (5 mg total) by mouth daily.   fluticasone 50 MCG/ACT nasal spray Commonly known as: FLONASE Place 1-2 sprays into both nostrils daily as needed for allergies.   hydrALAZINE 25 MG tablet Commonly known as: APRESOLINE Take 1 tablet (25 mg total) by mouth every 8 (eight) hours.   tamsulosin 0.4 MG Caps capsule Commonly known as: FLOMAX Take 1 capsule (0.4 mg total) by mouth daily.   VITAMIN B-12 IJ Inject 1,000 mcg as directed every 30 (thirty) days.        Follow-up Information     Ardis Hughs, MD. Schedule an appointment as soon as possible for a visit.   Specialty: Urology Contact information: [place] [address] [phone]         [name], MD. Schedule an appointment as soon as possible for a visit in 1 week(s).   Specialty: Internal Medicine Contact information: [place] [address] [phone]                Allergies  Allergen Reactions   Bee Venom Anaphylaxis    Allergic to Bee Sting -unknown reaction    Consultations: Urology, Dr. Lovena Neighbours   Procedures/Studies: US RENAL  Result Date: 06/12/2021 CLINICAL DATA:  76 year old male with marked bladder distension and bilateral hydronephrosis on recent ultrasound. Renal failure. EXAM: RENAL / URINARY TRACT ULTRASOUND COMPLETE COMPARISON:  Ultrasound 06/10/2021.  Abdomen MRI  03/08/2021. FINDINGS: Right Kidney: Renal measurements: 10.7 x 5.3 x 5.3 cm = volume: 158 mL. Resolved hydronephrosis. Cortical echogenicity within normal limits. No right renal mass. Left Kidney: Renal measurements: 10.9 x 6.4 x 4.7 cm = volume: 171 mL. Resolved left hydronephrosis. Cortical echogenicity within normal limits. No left renal mass. Bladder: Decompressed by Foley catheter with Foley balloon visible on image 43. Surrounding bladder trabeculation is apparent. Other: None. IMPRESSION: Resolved bilateral hydronephrosis following bladder decompression by Foley catheter. Electronically Signed   By: Genevie Ann M.D.   On: 06/12/2021 11:00   VAS [place] LOWER EXTREMITY VENOUS (DVT)  Result Date: 07/11/2021  Lower Venous DVT Study Patient Name:  JESSEN SIEGMAN  Date of Exam:   07/11/2021 Medical Rec #: [id number]           Accession #:    [id number] Date of Birth: [birth year]           Patient Gender: M Patient Age:   39 years Exam Location:  [facility] Procedure:      VAS [place] LOWER EXTREMITY VENOUS (DVT) Referring Phys: Ari Bernabei [place] --------------------------------------------------------------------------------  Indications: Follow up left peroneal DVT.  Comparison Study: 06/10/2021 left lower extremity venous duplex- left peroneal                   acute DVT Performing Technologist: Maudry Mayhew MHA, RDMS, RVT, RDCS  Examination Guidelines: A complete evaluation includes B-mode imaging, spectral Doppler, color Doppler, and power Doppler as needed of all accessible portions of each vessel. Bilateral testing is considered an integral part of a complete examination. Limited examinations for reoccurring indications may be performed as noted. The reflux portion of the exam is performed with the patient in reverse Trendelenburg.  +-----+---------------+---------+-----------+----------+--------------+  RIGHT Compressibility Phasicity Spontaneity Properties Thrombus Aging   +-----+---------------+---------+-----------+----------+--------------+  CFV   Full            Yes       Yes                                    +-----+---------------+---------+-----------+----------+--------------+   +---------+---------------+---------+-----------+----------+--------------+  LEFT      Compressibility Phasicity Spontaneity Properties Thrombus Aging  +---------+---------------+---------+-----------+----------+--------------+  CFV       Full            Yes       Yes                                    +---------+---------------+---------+-----------+----------+--------------+  SFJ       Full                                                             +---------+---------------+---------+-----------+----------+--------------+  FV Prox   Full                                                             +---------+---------------+---------+-----------+----------+--------------+  FV Mid    Full                                                             +---------+---------------+---------+-----------+----------+--------------+  FV Distal Full                                                             +---------+---------------+---------+-----------+----------+--------------+  PFV       Full                                                             +---------+---------------+---------+-----------+----------+--------------+  POP  Full            Yes       Yes                                    +---------+---------------+---------+-----------+----------+--------------+  PTV       Full                                                             +---------+---------------+---------+-----------+----------+--------------+  PERO      Full                                                             +---------+---------------+---------+-----------+----------+--------------+     Summary: RIGHT: - No evidence of common femoral vein obstruction.  LEFT: - Findings suggest resolution of previously noted  thrombus. - There is no evidence of deep vein thrombosis in the lower extremity.  - No cystic structure found in the popliteal fossa.  *See table(s) above for measurements and observations.    Preliminary      Subjective: Patient seen examined bedside, resting comfortably.  CBI now discontinued.  Foley catheter draining slightly pink-tinged urine.  Okay for discharge home per urology with outpatient follow-up with Dr. Louis Meckel and to maintain Foley catheter.  Discussed with patient vascular duplex ultrasound shows resolution of DVT and will discontinue Eliquis.  No other questions or concerns at this time.  Denies headache, no chest pain, no shortness of breath, no fever/chills/night sweats, no nausea/vomiting/diarrhea, no weakness, no fatigue, no paresthesias.  No acute events overnight per nursing staff.  Discharge Exam: Vitals:   07/11/21 0200 07/11/21 0531  BP: (!) 142/73 (!) 153/84  Pulse: 82 77  Resp: 18 15  Temp: 98.6 F (37 C) 97.9 F (36.6 C)  SpO2: 99% 100%   Vitals:   07/10/21 1730 07/10/21 2152 07/11/21 0200 07/11/21 0531  BP: (!) 147/87 137/79 (!) 142/73 (!) 153/84  Pulse: 88 85 82 77  Resp: 16 16 18 15   Temp: 97.9 F (36.6 C) 98.6 F (37 C) 98.6 F (37 C) 97.9 F (36.6 C)  TempSrc:  Oral Oral Oral  SpO2: 100% 100% 99% 100%  Weight:      Height:        General: Pt is alert, awake, not in acute distress Cardiovascular: RRR, S1/S2 +, no rubs, no gallops Respiratory: CTA bilaterally, no wheezing, no rhonchi, on room air Abdominal: Soft, NT, ND, bowel sounds + GU: Foley catheter noted with pink-tinged urine in collection bag Extremities: no edema, no cyanosis    The results of significant diagnostics from this hospitalization (including imaging, microbiology, ancillary and laboratory) are listed below for reference.     Microbiology: Recent Results (from the past 240 hour(s))  Resp Panel by RT-PCR (Flu A&B, Covid) Nasopharyngeal Swab     Status: None    Collection Time: 07/10/21  4:55 PM   Specimen: Nasopharyngeal Swab; Nasopharyngeal(NP) swabs in vial transport medium  Result Value Ref Range Status   SARS Coronavirus 2 by RT PCR NEGATIVE  NEGATIVE Final    Comment: (NOTE) SARS-CoV-2 target nucleic acids are NOT DETECTED.  The SARS-CoV-2 RNA is generally detectable in upper respiratory specimens during the acute phase of infection. The lowest concentration of SARS-CoV-2 viral copies this assay can detect is 138 copies/mL. A negative result does not preclude SARS-Cov-2 infection and should not be used as the sole basis for treatment or other patient management decisions. A negative result may occur with  improper specimen collection/handling, submission of specimen other than nasopharyngeal swab, presence of viral mutation(s) within the areas targeted by this assay, and inadequate number of viral copies(<138 copies/mL). A negative result must be combined with clinical observations, patient history, and epidemiological information. The expected result is Negative.  Fact Sheet for Patients:  EntrepreneurPulse.com.au  Fact Sheet for Healthcare Providers:  IncredibleEmployment.be  This test is no t yet approved or cleared by the [place] FDA and  has been authorized for detection and/or diagnosis of SARS-CoV-2 by FDA under an Emergency Use Authorization (EUA). This EUA will remain  in effect (meaning this test can be used) for the duration of the COVID-19 declaration under Section 564(b)(1) of the Act, 21 U.S.C.section 360bbb-3(b)(1), unless the authorization is terminated  or revoked sooner.       Influenza A by PCR NEGATIVE NEGATIVE Final   Influenza B by PCR NEGATIVE NEGATIVE Final    Comment: (NOTE) The Xpert Xpress SARS-CoV-2/FLU/RSV plus assay is intended as an aid in the diagnosis of influenza from Nasopharyngeal swab specimens and should not be used as a sole basis for treatment.  Nasal washings and aspirates are unacceptable for Xpert Xpress SARS-CoV-2/FLU/RSV testing.  Fact Sheet for Patients: EntrepreneurPulse.com.au  Fact Sheet for Healthcare Providers: IncredibleEmployment.be  This test is not yet approved or cleared by the [place] FDA and has been authorized for detection and/or diagnosis of SARS-CoV-2 by FDA under an Emergency Use Authorization (EUA). This EUA will remain in effect (meaning this test can be used) for the duration of the COVID-19 declaration under Section 564(b)(1) of the Act, 21 U.S.C. section 360bbb-3(b)(1), unless the authorization is terminated or revoked.  Performed at [facility], [place] [address], [place], [place] [address]      Labs: BNP (last 3 results) No results for input(s): BNP in the last 8760 hours. Basic Metabolic Panel: Recent Labs  Lab 07/10/21 0755 07/11/21 0346  NA 134* 134*  K 3.8 3.6  CL 103 105  CO2 22 21*  GLUCOSE 116* 106*  BUN 22 19  CREATININE 1.60* 1.38*  CALCIUM 9.4 9.0   Liver Function Tests: Recent Labs  Lab 07/10/21 0755  AST 19  ALT 17  ALKPHOS 54  BILITOT 0.9  PROT 6.9  ALBUMIN 4.2   No results for input(s): LIPASE, AMYLASE in the last 168 hours. No results for input(s): AMMONIA in the last 168 hours. CBC: Recent Labs  Lab 07/10/21 0755 07/11/21 0346  WBC 7.8 8.6  NEUTROABS 6.0  --   HGB 10.9* 9.2*  HCT 32.1* 28.0*  MCV 96.1 98.2  PLT 193 161   Cardiac Enzymes: No results for input(s): CKTOTAL, CKMB, CKMBINDEX, TROPONINI in the last 168 hours. BNP: Invalid input(s): POCBNP CBG: No results for input(s): GLUCAP in the last 168 hours. D-Dimer No results for input(s): DDIMER in the last 72 hours. Hgb A1c No results for input(s): HGBA1C in the last 72 hours. Lipid Profile No results for input(s): CHOL, HDL, LDLCALC, TRIG, CHOLHDL, LDLDIRECT in the last 72 hours. Thyroid function studies  No results for  input(s): TSH, T4TOTAL, T3FREE, THYROIDAB in the last 72 hours.  Invalid input(s): FREET3 Anemia work up No results for input(s): VITAMINB12, FOLATE, FERRITIN, TIBC, IRON, RETICCTPCT in the last 72 hours. Urinalysis    Component Value Date/Time   COLORURINE STRAW (A) 06/10/2021 0148   APPEARANCEUR CLEAR 06/10/2021 0148   LABSPEC 1.006 06/10/2021 0148   PHURINE 5.0 06/10/2021 0148   GLUCOSEU NEGATIVE 06/10/2021 0148   GLUCOSEU NEGATIVE 01/20/2021 0931   HGBUR SMALL (A) 06/10/2021 0148   BILIRUBINUR NEGATIVE 06/10/2021 0148   KETONESUR NEGATIVE 06/10/2021 0148   PROTEINUR NEGATIVE 06/10/2021 0148   UROBILINOGEN 0.2 01/20/2021 0931   NITRITE NEGATIVE 06/10/2021 0148   LEUKOCYTESUR NEGATIVE 06/10/2021 0148   Sepsis Labs Invalid input(s): PROCALCITONIN,  WBC,  LACTICIDVEN Microbiology Recent Results (from the past 240 hour(s))  Resp Panel by RT-PCR (Flu A&B, Covid) Nasopharyngeal Swab     Status: None   Collection Time: 07/10/21  4:55 PM   Specimen: Nasopharyngeal Swab; Nasopharyngeal(NP) swabs in vial transport medium  Result Value Ref Range Status   SARS Coronavirus 2 by RT PCR NEGATIVE NEGATIVE Final    Comment: (NOTE) SARS-CoV-2 target nucleic acids are NOT DETECTED.  The SARS-CoV-2 RNA is generally detectable in upper respiratory specimens during the acute phase of infection. The lowest concentration of SARS-CoV-2 viral copies this assay can detect is 138 copies/mL. A negative result does not preclude SARS-Cov-2 infection and should not be used as the sole basis for treatment or other patient management decisions. A negative result may occur with  improper specimen collection/handling, submission of specimen other than nasopharyngeal swab, presence of viral mutation(s) within the areas targeted by this assay, and inadequate number of viral copies(<138 copies/mL). A negative result must be combined with clinical observations, patient history, and  epidemiological information. The expected result is Negative.  Fact Sheet for Patients:  EntrepreneurPulse.com.au  Fact Sheet for Healthcare Providers:  IncredibleEmployment.be  This test is no t yet approved or cleared by the [place] FDA and  has been authorized for detection and/or diagnosis of SARS-CoV-2 by FDA under an Emergency Use Authorization (EUA). This EUA will remain  in effect (meaning this test can be used) for the duration of the COVID-19 declaration under Section 564(b)(1) of the Act, 21 U.S.C.section 360bbb-3(b)(1), unless the authorization is terminated  or revoked sooner.       Influenza A by PCR NEGATIVE NEGATIVE Final   Influenza B by PCR NEGATIVE NEGATIVE Final    Comment: (NOTE) The Xpert Xpress SARS-CoV-2/FLU/RSV plus assay is intended as an aid in the diagnosis of influenza from Nasopharyngeal swab specimens and should not be used as a sole basis for treatment. Nasal washings and aspirates are unacceptable for Xpert Xpress SARS-CoV-2/FLU/RSV testing.  Fact Sheet for Patients: EntrepreneurPulse.com.au  Fact Sheet for Healthcare Providers: IncredibleEmployment.be  This test is not yet approved or cleared by the [place] FDA and has been authorized for detection and/or diagnosis of SARS-CoV-2 by FDA under an Emergency Use Authorization (EUA). This EUA will remain in effect (meaning this test can be used) for the duration of the COVID-19 declaration under Section 564(b)(1) of the Act, 21 U.S.C. section 360bbb-3(b)(1), unless the authorization is terminated or revoked.  Performed at [facility], [place] [address] [place], [place] [address]      Time coordinating discharge: Over 30 minutes  SIGNED:   Donnamarie Poag [place] DO  [facility] 07/11/2021, 1:46 PM

## 2021-07-11 NOTE — Progress Notes (Signed)
PROGRESS NOTE    Samuel Fisher  IRJ:[id number] DOB: [birth year] DOA: 07/10/2021 PCP: [name], MD    Brief Narrative:  Samuel Fisher is a 76 y.o. male with medical history significant of essential hypertension, BPH, hyperlipidemia, benign schwannoma with facial nerve palsy, recent left lower extremity DVT on Eliquis who presented to [facility] ED on 07/10/2021 with abdominal pain/distention and inability urinate.  Patient has required an indwelling Foley catheter for the past month, follows with Dr. Louis Meckel outpatient.  Patient was seen by urology on 07/09/2021 with a voiding trial with removal of his Foley catheter.  Patient was instructed to self catheterize himself 4 times daily which he initially was comfortable was able to perform.  Earlier this morning, patient felt urge to void with associated abdominal discomfort and distention and attempted self-catheterization x2 which was unsuccessful with return of small amount of blood with clot.  Given his inability to catheterize himself, patient presented to ED for further evaluation.   In the ED, temperature 97.9 F, HR 83, RR 13, BP 146/68, SPO2 100% on room air.  WBC 7.8, hemoglobin 10.9, platelets 193.  Sodium 134, potassium 3.8, chloride 103, CO2 22, BUN 22, creatinine 1.60, glucose 116.  Urology was consulted, started on complete bladder irrigation.  Given patient's persistence with hematuria, urology recommended medical admission for further evaluation and management with continued bladder irrigation.   Assessment & Plan:   Principal Problem:   Acute urinary retention Active Problems:   BPH (benign prostatic hyperplasia)   Benign schwannoma   Facial nerve palsy   Hematuria   Deep vein thrombosis (DVT) of left lower extremity (HCC)  Acute urinary retention secondary to BPH Hematuria Patient presenting to the ED with abdominal pain/distention with inability to urinate or self catheterize himself at home this morning.  Patient  attempted to self catheterizations with only return of blood and few clots.  Was seen by urologist morning, Foley catheter was placed and patient maintains on complete bladder irrigation.  Given persistent hematuria, urology requesting medical admission.  Hemoglobin 10.9 on admission. --Urology following, appreciate assistance --CBI discontinued by urology this morning --Continue home finasteride and tamsulosin --Follow CBC daily --If patient able to ambulate and sit in chair for the majority of the day with urine staying clear to light pink, okay for discharge home with Foley catheter and outpatient follow-up with Dr. Louis Meckel for definitive management of his catheter   Hx DVT left peroneal vein  Patient with recent left lower extremity DVT to left peroneal vein noted on vascular duplex ultrasound 06/11/2021.  Has been on Eliquis since.  No further edema.  No previous history of PE/DVT, etiology of thought to be secondary to trauma after patient twisted his ankle. --Repeat vascular duplex ultrasound to assess resolution of DVT given his hematuria and currently on anticoagulation with Eliquis. --Hold Eliquis for now   Essential hypertension --Continue hydralazine 25 mg p.o. every 8 hours   BPH --Finasteride --Tamsulosin 0.4 mg p.o. daily   Hx benign schwannoma with facial nerve palsy --Stable   DVT prophylaxis: SCDs Start: 07/10/21 1717   Code Status: Full Code Family Communication: No family present at bedside this morning  Disposition Plan:  Level of care: Med-Surg Status is: Inpatient  Remains inpatient appropriate because: Pending vascular duplex ultrasound left lower extremity, CBI turned off, need to monitor to ensure no further clot/gross hematuria    Consultants:  Urology, Dr. Lovena Neighbours  Procedures:  CBI Vascular duplex ultrasound left lower extremity: Pending  Antimicrobials:  None   Subjective: Patient seen examined bedside, resting comfortably.  Lying in bed.  No  family present.  Urine more clear this morning, although required hand irrigation for clot overnight.  Seen by urology this morning, turned off CBI and if urine remains clear to light pink after ambulation may be able to discharge home later this afternoon.  No other questions or concerns at this time.  Denies headache, no fever/chills/night sweats, no nausea/vomiting/diarrhea, no chest pain, no palpitations, no shortness of breath, no abdominal pain, no weakness, no fatigue, no paresthesias.  No acute events overnight per nursing staff.  Objective: Vitals:   07/10/21 1730 07/10/21 2152 07/11/21 0200 07/11/21 0531  BP: (!) 147/87 137/79 (!) 142/73 (!) 153/84  Pulse: 88 85 82 77  Resp: 16 16 18 15   Temp: 97.9 F (36.6 C) 98.6 F (37 C) 98.6 F (37 C) 97.9 F (36.6 C)  TempSrc:  Oral Oral Oral  SpO2: 100% 100% 99% 100%  Weight:      Height:        Intake/Output Summary (Last 24 hours) at 07/11/2021 1044 Last data filed at 07/11/2021 1005 Gross per 24 hour  Intake 11400 ml  Output 13800 ml  Net -2400 ml   Filed Weights   07/10/21 0608  Weight: 81.6 kg    Examination:  General exam: Appears calm and comfortable  Respiratory system: Clear to auscultation. Respiratory effort normal. Cardiovascular system: S1 & S2 heard, RRR. No JVD, murmurs, rubs, gallops or clicks. No pedal edema. Gastrointestinal system: Abdomen is nondistended, soft and nontender. No organomegaly or masses felt. Normal bowel sounds heard. Central nervous system: Alert and oriented.  Right-sided facial droop secondary to facial nerve palsy noted, baseline, otherwise no focal neurological deficits. Extremities: Symmetric 5 x 5 power. Skin: No rashes, lesions or ulcers Psychiatry: Judgement and insight appear normal. Mood & affect appropriate.     Data Reviewed: I have personally reviewed following labs and imaging studies  CBC: Recent Labs  Lab 07/10/21 0755 07/11/21 0346  WBC 7.8 8.6  NEUTROABS 6.0  --    HGB 10.9* 9.2*  HCT 32.1* 28.0*  MCV 96.1 98.2  PLT 193 546   Basic Metabolic Panel: Recent Labs  Lab 07/10/21 0755 07/11/21 0346  NA 134* 134*  K 3.8 3.6  CL 103 105  CO2 22 21*  GLUCOSE 116* 106*  BUN 22 19  CREATININE 1.60* 1.38*  CALCIUM 9.4 9.0   GFR: Estimated Creatinine Clearance: 46.1 mL/min (A) (by C-G formula based on SCr of 1.38 mg/dL (H)). Liver Function Tests: Recent Labs  Lab 07/10/21 0755  AST 19  ALT 17  ALKPHOS 54  BILITOT 0.9  PROT 6.9  ALBUMIN 4.2   No results for input(s): LIPASE, AMYLASE in the last 168 hours. No results for input(s): AMMONIA in the last 168 hours. Coagulation Profile: No results for input(s): INR, PROTIME in the last 168 hours. Cardiac Enzymes: No results for input(s): CKTOTAL, CKMB, CKMBINDEX, TROPONINI in the last 168 hours. BNP (last 3 results) No results for input(s): PROBNP in the last 8760 hours. HbA1C: No results for input(s): HGBA1C in the last 72 hours. CBG: No results for input(s): GLUCAP in the last 168 hours. Lipid Profile: No results for input(s): CHOL, HDL, LDLCALC, TRIG, CHOLHDL, LDLDIRECT in the last 72 hours. Thyroid Function Tests: No results for input(s): TSH, T4TOTAL, FREET4, T3FREE, THYROIDAB in the last 72 hours. Anemia Panel: No results for input(s): VITAMINB12, FOLATE, FERRITIN, TIBC, IRON, RETICCTPCT in the  last 72 hours. Sepsis Labs: No results for input(s): PROCALCITON, LATICACIDVEN in the last 168 hours.  Recent Results (from the past 240 hour(s))  Resp Panel by RT-PCR (Flu A&B, Covid) Nasopharyngeal Swab     Status: None   Collection Time: 07/10/21  4:55 PM   Specimen: Nasopharyngeal Swab; Nasopharyngeal(NP) swabs in vial transport medium  Result Value Ref Range Status   SARS Coronavirus 2 by RT PCR NEGATIVE NEGATIVE Final    Comment: (NOTE) SARS-CoV-2 target nucleic acids are NOT DETECTED.  The SARS-CoV-2 RNA is generally detectable in upper respiratory specimens during the acute  phase of infection. The lowest concentration of SARS-CoV-2 viral copies this assay can detect is 138 copies/mL. A negative result does not preclude SARS-Cov-2 infection and should not be used as the sole basis for treatment or other patient management decisions. A negative result may occur with  improper specimen collection/handling, submission of specimen other than nasopharyngeal swab, presence of viral mutation(s) within the areas targeted by this assay, and inadequate number of viral copies(<138 copies/mL). A negative result must be combined with clinical observations, patient history, and epidemiological information. The expected result is Negative.  Fact Sheet for Patients:  EntrepreneurPulse.com.au  Fact Sheet for Healthcare Providers:  IncredibleEmployment.be  This test is no t yet approved or cleared by the [place] FDA and  has been authorized for detection and/or diagnosis of SARS-CoV-2 by FDA under an Emergency Use Authorization (EUA). This EUA will remain  in effect (meaning this test can be used) for the duration of the COVID-19 declaration under Section 564(b)(1) of the Act, 21 U.S.C.section 360bbb-3(b)(1), unless the authorization is terminated  or revoked sooner.       Influenza A by PCR NEGATIVE NEGATIVE Final   Influenza B by PCR NEGATIVE NEGATIVE Final    Comment: (NOTE) The Xpert Xpress SARS-CoV-2/FLU/RSV plus assay is intended as an aid in the diagnosis of influenza from Nasopharyngeal swab specimens and should not be used as a sole basis for treatment. Nasal washings and aspirates are unacceptable for Xpert Xpress SARS-CoV-2/FLU/RSV testing.  Fact Sheet for Patients: EntrepreneurPulse.com.au  Fact Sheet for Healthcare Providers: IncredibleEmployment.be  This test is not yet approved or cleared by the [place] FDA and has been authorized for detection and/or diagnosis of  SARS-CoV-2 by FDA under an Emergency Use Authorization (EUA). This EUA will remain in effect (meaning this test can be used) for the duration of the COVID-19 declaration under Section 564(b)(1) of the Act, 21 U.S.C. section 360bbb-3(b)(1), unless the authorization is terminated or revoked.  Performed at [facility], [place] [address] [place], [place] [address]          Radiology Studies: No results found.      Scheduled Meds:  Chlorhexidine Gluconate Cloth  6 each Topical Daily   finasteride  5 mg Oral Daily   hydrALAZINE  25 mg Oral Q8H   tamsulosin  0.4 mg Oral Daily   Continuous Infusions:  sodium chloride irrigation       LOS: 1 day    Time spent: 39 minutes spent on chart review, discussion with nursing staff, consultants, updating family and interview/physical exam; more than 50% of that time was spent in counseling and/or coordination of care.    Dianna Deshler J [place] DO [facility] Available via Epic secure chat 7am-7pm After these hours, please refer to coverage provider listed on [email or web address] 07/11/2021, 10:44 AM

## 2021-07-11 NOTE — Care Management CC44 (Signed)
Condition Code 44 Documentation Completed  Patient Details  Name: Samuel Fisher MRN: [id number] Date of Birth: Aug 19, 1945   Condition Code 44 given:   (pt released prior to being given) Patient signature on Condition Code 44 notice:   no- pt released prior to giving. Documentation of 2 MD's agreement:   yes Code 44 added to claim:   yes    Ludwig Clarks, LCSW 07/11/2021, 2:31 PM

## 2021-07-11 NOTE — Progress Notes (Signed)
Order to discharge pt home.  Discharge instructions/AVS given to patient and reviewed - education provided as needed.  Urinary catheter bag switched to leg bag.  Education provided.  Pt to call PCP and/or come back to the hospital if there are any problems. Pt verbalized understanding.

## 2021-07-11 NOTE — Progress Notes (Signed)
Pt ambulated in hallway - completed 2 laps with no assist.  Will continue to follow.

## 2021-07-11 NOTE — Progress Notes (Signed)
°  Subjective: No complaints this AM.  Pt required hand irrigation of his catheter last night, but nothing since.  Urine much clearer this AM.    Objective: Vital signs in last 24 hours: Temp:  [97.9 F (36.6 C)-98.6 F (37 C)] 97.9 F (36.6 C) (01/22 0531) Pulse Rate:  [76-103] 77 (01/22 0531) Resp:  [8-21] 15 (01/22 0531) BP: (107-156)/(60-87) 153/84 (01/22 0531) SpO2:  [98 %-100 %] 100 % (01/22 0531)  Intake/Output from previous day: 01/21 0701 - 01/22 0700 In: 9000  Out: 12750 [Urine:12750]  Intake/Output this shift: Total I/O In: -  Out: 1050 [Urine:1050]  Physical Exam:  General: Alert and oriented GU: 22 French three-way Foley catheter in place and draining very light pink urine  Lab Results: Recent Labs    07/10/21 0755 07/11/21 0346  HGB 10.9* 9.2*  HCT 32.1* 28.0*   BMET Recent Labs    07/10/21 0755 07/11/21 0346  NA 134* 134*  K 3.8 3.6  CL 103 105  CO2 22 21*  GLUCOSE 116* 106*  BUN 22 19  CREATININE 1.60* 1.38*  CALCIUM 9.4 9.0     Studies/Results: No results found.  Assessment/Plan: 76 year old male with a history of BPH with urinary retention and gross hematuria  -CBI turned off this morning due to significant improvement in the appearance of his urine.  I would like for him to ambulate and sit up in the chair for the majority of the day.  So long as his urine stays clear to light pink, he is okay for discharge home with his Foley catheter.  Outpatient arrangements will be made with Dr. Louis Meckel for definitive management of his catheter.   LOS: 1 day   Ellison Hughs, MD [facility] Pager: [phone]  07/11/2021, 10:02 AM

## 2021-07-11 NOTE — Plan of Care (Signed)

## 2021-07-11 NOTE — Care Management CC44 (Deleted)
Condition Code 44 Documentation Completed  Patient Details  Name: DEMETRE MONACO MRN: [id number] Date of Birth: 30-Sep-1945   Condition Code 44 given:   (Pt releasd prior to it being given) Patient signature on Condition Code 44 notice:    Documentation of 2 MD's agreement:    Code 44 added to claim:       Ludwig Clarks, LCSW 07/11/2021, 2:21 PM

## 2021-07-12 ENCOUNTER — Ambulatory Visit (INDEPENDENT_AMBULATORY_CARE_PROVIDER_SITE_OTHER): Payer: Medicare Other

## 2021-07-12 ENCOUNTER — Other Ambulatory Visit: Payer: Self-pay

## 2021-07-12 DIAGNOSIS — E538 Deficiency of other specified B group vitamins: Secondary | ICD-10-CM | POA: Diagnosis not present

## 2021-07-12 MED ORDER — CYANOCOBALAMIN 1000 MCG/ML IJ SOLN
1000.0000 ug | Freq: Once | INTRAMUSCULAR | Status: AC
  Administered 2021-07-12: 1000 ug via INTRAMUSCULAR

## 2021-07-12 NOTE — Progress Notes (Signed)
B12 given patient tolerated well

## 2021-07-15 DIAGNOSIS — R972 Elevated prostate specific antigen [PSA]: Secondary | ICD-10-CM | POA: Diagnosis not present

## 2021-07-15 DIAGNOSIS — R338 Other retention of urine: Secondary | ICD-10-CM | POA: Diagnosis not present

## 2021-07-21 ENCOUNTER — Other Ambulatory Visit: Payer: Self-pay

## 2021-07-21 ENCOUNTER — Encounter: Payer: Self-pay | Admitting: Internal Medicine

## 2021-07-21 ENCOUNTER — Ambulatory Visit (INDEPENDENT_AMBULATORY_CARE_PROVIDER_SITE_OTHER): Payer: Medicare Other | Admitting: Internal Medicine

## 2021-07-21 VITALS — BP 154/84 | HR 88 | Temp 97.9°F | Ht 68.0 in | Wt 174.0 lb

## 2021-07-21 DIAGNOSIS — E538 Deficiency of other specified B group vitamins: Secondary | ICD-10-CM | POA: Diagnosis not present

## 2021-07-21 DIAGNOSIS — I10 Essential (primary) hypertension: Secondary | ICD-10-CM

## 2021-07-21 DIAGNOSIS — N139 Obstructive and reflux uropathy, unspecified: Secondary | ICD-10-CM

## 2021-07-21 LAB — FOLATE: Folate: 14.4 ng/mL (ref >5.9)

## 2021-07-21 LAB — CBC WITH DIFFERENTIAL/PLATELET
Basophils Absolute: 0 10*3/uL (ref 0.0–0.1)
Basophils Relative: 0.6 % (ref 0.0–3.0)
Eosinophils Absolute: 0.2 10*3/uL (ref 0.0–0.7)
Eosinophils Relative: 3.1 % (ref 0.0–5.0)
HCT: 31 % — ABNORMAL LOW (ref 39.0–52.0)
Hemoglobin: 10.4 g/dL — ABNORMAL LOW (ref 13.0–17.0)
Lymphocytes Relative: 19.9 % (ref 12.0–46.0)
Lymphs Abs: 1.3 10*3/uL (ref 0.7–4.0)
MCHC: 33.5 g/dL (ref 30.0–36.0)
MCV: 93.7 fl (ref 78.0–100.0)
Monocytes Absolute: 0.6 10*3/uL (ref 0.1–1.0)
Monocytes Relative: 8.4 % (ref 3.0–12.0)
Neutro Abs: 4.5 10*3/uL (ref 1.4–7.7)
Neutrophils Relative %: 68 % (ref 43.0–77.0)
Platelets: 275 10*3/uL (ref 150.0–400.0)
RBC: 3.31 Mil/uL — ABNORMAL LOW (ref 4.22–5.81)
RDW: 13.5 % (ref 11.5–15.5)
WBC: 6.5 10*3/uL (ref 4.0–10.5)

## 2021-07-21 MED ORDER — HYDRALAZINE HCL 50 MG PO TABS: 50.0000 mg | ORAL_TABLET | Freq: Three times a day (TID) | ORAL | 0 refills | Status: DC

## 2021-07-21 NOTE — Progress Notes (Signed)
Subjective:  Patient ID: Samuel Fisher, male    DOB: [birth year]  Age: 77 y.o. MRN: [id number]  CC: Anemia  This visit occurred during the SARS-CoV-2 public health emergency.  Safety protocols were in place, including screening questions prior to the visit, additional usage of staff PPE, and extensive cleaning of exam room while observing appropriate contact time as indicated for disinfecting solutions.    HPI Samuel Fisher presents for f/up - He was recently admitted for obstructive uropathy complicated by gross hematuria.  He tells me he is scheduled soon to undergo  TURP.  He had a DVT but it has resolved and is no longer taking the DOAC.  He can climb up and down stairs without experiencing chest pain, shortness of breath, dyspnea on exertion, or edema.  Admit date: 07/10/2021 Discharge date: 07/11/2021   Admitted From: Home Disposition: Home   Recommendations for Outpatient Follow-up:  Follow up with PCP in 1-2 weeks Follow-up with urology, Dr. Gorden Harms as scheduled Discontinued Eliquis with resolution of left lower extremity DVT on repeat ultrasound during hospitalization.   Home Health: No Equipment/Devices: Foley catheter   Discharge Condition: Stable CODE STATUS: Full code Diet recommendation: Heart healthy diet   Outpatient Medications Prior to Visit  Medication Sig Dispense Refill   Cyanocobalamin (VITAMIN B-12 IJ) Inject 1,000 mcg as directed every 30 (thirty) days.     finasteride (PROSCAR) 5 MG tablet Take 1 tablet (5 mg total) by mouth daily. 30 tablet 1   fluticasone (FLONASE) 50 MCG/ACT nasal spray Place 1-2 sprays into both nostrils daily as needed for allergies.  2   tamsulosin (FLOMAX) 0.4 MG CAPS capsule Take 1 capsule (0.4 mg total) by mouth daily. 30 capsule 1   hydrALAZINE (APRESOLINE) 25 MG tablet Take 1 tablet (25 mg total) by mouth every 8 (eight) hours. 90 tablet 1   No facility-administered medications prior to visit.    ROS Review of  Systems  Constitutional:  Positive for fatigue. Negative for appetite change, chills, diaphoresis and unexpected weight change.  HENT: Negative.    Eyes: Negative.   Respiratory:  Negative for cough, chest tightness, shortness of breath and wheezing.   Cardiovascular:  Negative for chest pain and palpitations.  Gastrointestinal:  Negative for abdominal pain, blood in stool, constipation, diarrhea and vomiting.  Endocrine: Negative.   Genitourinary:  Positive for difficulty urinating. Negative for hematuria.       Foley cath  Musculoskeletal:  Negative for arthralgias and myalgias.  Skin: Negative.  Negative for color change and pallor.  Neurological:  Negative for dizziness, weakness, light-headedness and headaches.  Hematological:  Negative for adenopathy. Does not bruise/bleed easily.  Psychiatric/Behavioral: Negative.     Objective:  BP (!) 154/84 (BP Location: Left Arm, Patient Position: Sitting, Cuff Size: Large)    Pulse 88    Temp 97.9 F (36.6 C) (Oral)    Ht 5\' 8"  (1.727 m)    Wt 174 lb (78.9 kg)    SpO2 96%    BMI 26.46 kg/m   BP Readings from Last 3 Encounters:  07/23/21 139/84  07/21/21 (!) 154/84  07/11/21 (!) 153/84    Wt Readings from Last 3 Encounters:  07/23/21 171 lb 3.2 oz (77.7 kg)  07/21/21 174 lb (78.9 kg)  07/10/21 180 lb (81.6 kg)    Physical Exam Vitals reviewed.  HENT:     Nose: Nose normal.     Mouth/Throat:     Mouth: Mucous membranes are moist.  Eyes:  General: No scleral icterus.    Conjunctiva/sclera: Conjunctivae normal.  Cardiovascular:     Rate and Rhythm: Normal rate and regular rhythm.     Heart sounds: No murmur heard. Pulmonary:     Effort: Pulmonary effort is normal.     Breath sounds: No stridor. No wheezing, rhonchi or rales.  Abdominal:     General: Abdomen is flat.     Palpations: There is no mass.     Tenderness: There is no abdominal tenderness. There is no guarding.     Hernia: No hernia is present.   Musculoskeletal:        General: Normal range of motion.     Cervical back: Neck supple.     Right lower leg: No edema.     Left lower leg: No edema.  Lymphadenopathy:     Cervical: No cervical adenopathy.  Skin:    General: Skin is warm and dry.     Coloration: Skin is pale.  Neurological:     General: No focal deficit present.     Mental Status: He is alert.  Psychiatric:        Mood and Affect: Mood normal.        Behavior: Behavior normal.    Lab Results  Component Value Date   WBC 6.5 07/21/2021   HGB 10.4 (L) 07/21/2021   HCT 31.0 (L) 07/21/2021   PLT 275.0 07/21/2021   GLUCOSE 106 (H) 07/11/2021   CHOL 159 01/20/2021   TRIG 70.0 01/20/2021   HDL 62.40 01/20/2021   LDLCALC 82 01/20/2021   ALT 17 07/10/2021   AST 19 07/10/2021   NA 134 (L) 07/11/2021   K 3.6 07/11/2021   CL 105 07/11/2021   CREATININE 1.38 (H) 07/11/2021   BUN 19 07/11/2021   CO2 21 (L) 07/11/2021   TSH 2.57 01/20/2021   PSA 8.44 12/29/2020   HGBA1C 5.7 01/20/2021    VAS [place] LOWER EXTREMITY VENOUS (DVT)  Result Date: 07/11/2021  Lower Venous DVT Study Patient Name:  Samuel Fisher  Date of Exam:   07/11/2021 Medical Rec #: [id number]           Accession #:    [id number] Date of Birth: 1946/03/06           Patient Gender: M Patient Age:   13 years Exam Location:  [facility] Procedure:      VAS [place] LOWER EXTREMITY VENOUS (DVT) Referring Phys: ERIC [place] --------------------------------------------------------------------------------  Indications: Follow up left peroneal DVT.  Comparison Study: 06/10/2021 left lower extremity venous duplex- left peroneal                   acute DVT Performing Technologist: Maudry Mayhew MHA, RDMS, RVT, RDCS  Examination Guidelines: A complete evaluation includes B-mode imaging, spectral Doppler, color Doppler, and power Doppler as needed of all accessible portions of each vessel. Bilateral testing is considered an integral part of a complete  examination. Limited examinations for reoccurring indications may be performed as noted. The reflux portion of the exam is performed with the patient in reverse Trendelenburg.  +-----+---------------+---------+-----------+----------+--------------+  RIGHT Compressibility Phasicity Spontaneity Properties Thrombus Aging  +-----+---------------+---------+-----------+----------+--------------+  CFV   Full            Yes       Yes                                    +-----+---------------+---------+-----------+----------+--------------+   +---------+---------------+---------+-----------+----------+--------------+  LEFT      Compressibility Phasicity Spontaneity Properties Thrombus Aging  +---------+---------------+---------+-----------+----------+--------------+  CFV       Full            Yes       Yes                                    +---------+---------------+---------+-----------+----------+--------------+  SFJ       Full                                                             +---------+---------------+---------+-----------+----------+--------------+  FV Prox   Full                                                             +---------+---------------+---------+-----------+----------+--------------+  FV Mid    Full                                                             +---------+---------------+---------+-----------+----------+--------------+  FV Distal Full                                                             +---------+---------------+---------+-----------+----------+--------------+  PFV       Full                                                             +---------+---------------+---------+-----------+----------+--------------+  POP       Full            Yes       Yes                                    +---------+---------------+---------+-----------+----------+--------------+  PTV       Full                                                              +---------+---------------+---------+-----------+----------+--------------+  PERO      Full                                                             +---------+---------------+---------+-----------+----------+--------------+  Summary: RIGHT: - No evidence of common femoral vein obstruction.  LEFT: - Findings suggest resolution of previously noted thrombus. - There is no evidence of deep vein thrombosis in the lower extremity.  - No cystic structure found in the popliteal fossa.  *See table(s) above for measurements and observations. Electronically signed by Deitra Mayo MD on 07/11/2021 at 3:23:40 PM.    Final     Assessment & Plan:   Samuel Fisher was seen today for anemia.  Diagnoses and all orders for this visit:  Essential hypertension, benign- His blood pressure is not adequately well controlled.  Will increase the dose of hydralazine. -     hydrALAZINE (APRESOLINE) 50 MG tablet; Take 1 tablet (50 mg total) by mouth 3 (three) times daily.  B12 deficiency- His H&H has improved.  His folate is normal.  Will continue parenteral B12 replacement therapy. -     Folate; Future -     CBC with Differential/Platelet; Future -     CBC with Differential/Platelet -     Folate  Obstructive uropathy-Foley catheter in place.  He will undergo TURP soon.   I have discontinued Esley B. Topete's hydrALAZINE. I am also having him start on hydrALAZINE. Additionally, I am having him maintain his fluticasone, Cyanocobalamin (VITAMIN B-12 IJ), finasteride, and tamsulosin.  Meds ordered this encounter  Medications   hydrALAZINE (APRESOLINE) 50 MG tablet    Sig: Take 1 tablet (50 mg total) by mouth 3 (three) times daily.    Dispense:  270 tablet    Refill:  0     Follow-up: Return in about 3 months (around 10/18/2021).  Scarlette Calico, MD

## 2021-07-21 NOTE — Patient Instructions (Signed)
Anemia Anemia is a condition in which there is not enough red blood cells or hemoglobin in the blood. Hemoglobin is a substance in red blood cells that carries oxygen. When you do not have enough red blood cells or hemoglobin (are anemic), your body cannot get enough oxygen and your organs may not work properly. As a result, you may feel very tired or have other problems. What are the causes? Common causes of anemia include: Excessive bleeding. Anemia can be caused by excessive bleeding inside or outside the body, including bleeding from the intestines or from heavy menstrual periods in females. Poor nutrition. Long-lasting (chronic) kidney, thyroid, and liver disease. Bone marrow disorders, spleen problems, and blood disorders. Cancer and treatments for cancer. HIV (human immunodeficiency virus) and AIDS (acquired immunodeficiency syndrome). Infections, medicines, and autoimmune disorders that destroy red blood cells. What are the signs or symptoms? Symptoms of this condition include: Minor weakness. Dizziness. Headache, or difficulties concentrating and sleeping. Heartbeats that feel irregular or faster than normal (palpitations). Shortness of breath, especially with exercise. Pale skin, lips, and nails, or cold hands and feet. Indigestion and nausea. Symptoms may occur suddenly or develop slowly. If your anemia is mild, you may not have symptoms. How is this diagnosed? This condition is diagnosed based on blood tests, your medical history, and a physical exam. In some cases, a test may be needed in which cells are removed from the soft tissue inside of a bone and looked at under a microscope (bone marrow biopsy). Your health care provider may also check your stool (feces) for blood and may do additional testing to look for the cause of your bleeding. Other tests may include: Imaging tests, such as a CT scan or MRI. A procedure to see inside your esophagus and stomach (endoscopy). A  procedure to see inside your colon and rectum (colonoscopy). How is this treated? Treatment for this condition depends on the cause. If you continue to lose a lot of blood, you may need to be treated at a hospital. Treatment may include: Taking supplements of iron, vitamin M41, or folic acid. Taking a hormone medicine (erythropoietin) that can help to stimulate red blood cell growth. Having a blood transfusion. This may be needed if you lose a lot of blood. Making changes to your diet. Having surgery to remove your spleen. Follow these instructions at home: Take over-the-counter and prescription medicines only as told by your health care provider. Take supplements only as told by your health care provider. Follow any diet instructions that you were given by your health care provider. Keep all follow-up visits as told by your health care provider. This is important. Contact a health care provider if: You develop new bleeding anywhere in the body. Get help right away if: You are very weak. You are short of breath. You have pain in your abdomen or chest. You are dizzy or feel faint. You have trouble concentrating. You have bloody stools, black stools, or tarry stools. You vomit repeatedly or you vomit up blood. These symptoms may represent a serious problem that is an emergency. Do not wait to see if the symptoms will go away. Get medical help right away. Call your local emergency services (911 in the [place] Do not drive yourself to the hospital. Summary Anemia is a condition in which you do not have enough red blood cells or enough of a substance in your red blood cells that carries oxygen (hemoglobin). Symptoms may occur suddenly or develop slowly. If your anemia is  mild, you may not have symptoms. °This condition is diagnosed with blood tests, a medical history, and a physical exam. Other tests may be needed. °Treatment for this condition depends on the cause of the anemia. °This  information is not intended to replace advice given to you by your health care provider. Make sure you discuss any questions you have with your health care provider. °Document Revised: 05/14/2019 Document Reviewed: 05/14/2019 °Elsevier Patient Education © 2022 [employer] ° °

## 2021-07-22 ENCOUNTER — Other Ambulatory Visit: Payer: Self-pay | Admitting: Urology

## 2021-07-22 NOTE — Patient Instructions (Addendum)
DUE TO COVID-19 ONLY ONE VISITOR IS ALLOWED TO COME WITH YOU AND STAY IN THE WAITING ROOM ONLY DURING PRE OP AND PROCEDURE.   **NO VISITORS ARE ALLOWED IN THE SHORT STAY AREA OR RECOVERY ROOM!!**  IF YOU WILL BE ADMITTED INTO THE HOSPITAL YOU ARE ALLOWED ONLY TWO SUPPORT PEOPLE DURING VISITATION HOURS ONLY (7 AM -8PM)    Up to two visitors ages 33+ are allowed at one time in a patient's room.  The visitors may rotate out with other people throughout the day.  Additionally, up to two children between the ages of 61 and 31 are allowed and do not count toward the number of allowed visitors.  Children within this age range must be accompanied by an adult visitor.  One adult visitor may remain with the patient overnight and must be in the room by 8 PM.  COVID SWAB TESTING MUST BE COMPLETED ON:  08-03-21 @  8:15 AM  COME IN THROUGH MAIN ENTRANCE of [facility].  Take a seat in the lobby area to the right as you come in the main entrance.  Call [phone]  and give your name and let them know you are here for COVID testing  You are not required to quarantine, however you are required to wear a well-fitted mask when you are out and around people not in your household.  Hand Hygiene often Do NOT share personal items Notify your provider if you are in close contact with someone who has COVID or you develop fever 100.4 or greater, new onset of sneezing, cough, sore throat, shortness of breath or body aches.       Your procedure is scheduled on: Thursday, 08-05-21   Report to [facility] Main  Entrance     Report to admitting at 7:30 AM   Call this number if you have problems the morning of surgery [phone]   Follow Fleet enema instructions from surgeon's office   Do not eat food :After Midnight.   May have liquids until 6:45 AM  day of surgery  CLEAR LIQUID DIET  Foods Allowed                                                                     Foods Excluded  Water, Black Coffee  (no milk/no creamer) and tea, regular and decaf                              liquids that you cannot  Plain Jell-O in any flavor  (No red)                         see through such as: Fruit ices (not with fruit pulp)                                 milk, soups, orange juice  Iced Popsicles (No red)                                    All solid food  Apple juices Sports drinks like Gatorade (No red) Lightly seasoned clear broth or consume(fat free) Sugar    Oral Hygiene is also important to reduce your risk of infection.                                    Remember - BRUSH YOUR TEETH THE MORNING OF SURGERY WITH YOUR REGULAR TOOTHPASTE   Do NOT smoke after Midnight   Take these medicines the morning of surgery with A SIP OF WATER:  Finasteride, Hydralazine, Tamsulosin   Stop all vitamins and herbal supplements a week before surgery             You may not have any metal on your body including  jewelry, and body piercing             Do not wear  lotions, powders, cologne, or deodorant              Men may shave face and neck.  Do not bring valuables to the hospital. [place].   Contacts, dentures or bridgework may not be worn into surgery.   Bring small overnight bag day of surgery.   Special Instructions: Bring a copy of your healthcare power of attorney and living will documents the day of surgery if you haven't scanned them in before.  Please read over the following fact sheets you were given: IF YOU HAVE QUESTIONS ABOUT YOUR PRE OP INSTRUCTIONS PLEASE CALL [facility] - Preparing for Surgery Before surgery, you can play an important role.  Because skin is not sterile, your skin needs to be as free of germs as possible.  You can reduce the number of germs on your skin by washing with CHG (chlorahexidine gluconate) soap before surgery.  CHG is an antiseptic cleaner which kills germs and bonds with the  skin to continue killing germs even after washing. Please DO NOT use if you have an allergy to CHG or antibacterial soaps.  If your skin becomes reddened/irritated stop using the CHG and inform your nurse when you arrive at Short Stay. Do not shave (including legs and underarms) for at least 48 hours prior to the first CHG shower.  You may shave your face/neck.  Please follow these instructions carefully:  1.  Shower with CHG Soap the night before surgery and the  morning of surgery.  2.  If you choose to wash your hair, wash your hair first as usual with your normal  shampoo.  3.  After you shampoo, rinse your hair and body thoroughly to remove the shampoo.                             4.  Use CHG as you would any other liquid soap.  You can apply chg directly to the skin and wash.  Gently with a scrungie or clean washcloth.  5.  Apply the CHG Soap to your body ONLY FROM THE NECK DOWN.   Do   not use on face/ open                           Wound or open sores. Avoid contact with eyes, ears mouth and   genitals (private parts).  Wash face,  Genitals (private parts) with your normal soap.             6.  Wash thoroughly, paying special attention to the area where your    surgery  will be performed.  7.  Thoroughly rinse your body with warm water from the neck down.  8.  DO NOT shower/wash with your normal soap after using and rinsing off the CHG Soap.                9.  Pat yourself dry with a clean towel.            10.  Wear clean pajamas.            11.  Place clean sheets on your bed the night of your first shower and do not  sleep with pets. Day of Surgery : Do not apply any lotions/deodorants the morning of surgery.  Please wear clean clothes to the hospital/surgery center.  FAILURE TO FOLLOW THESE INSTRUCTIONS MAY RESULT IN THE CANCELLATION OF YOUR SURGERY  PATIENT SIGNATURE_________________________________  NURSE  SIGNATURE__________________________________  ________________________________________________________________________

## 2021-07-22 NOTE — Progress Notes (Addendum)
COVID swab appointment: 08-03-21 @ 8:15 AM  COVID Vaccine Completed: Yes x2 Date COVID Vaccine completed: 08-15-19 09-12-19 Has received booster:Yes x1 COVID vaccine manufacturer: Moderna     Date of COVID positive in last 90 days:  No  PCP - Scarlette Calico, MD Cardiologist - N/A  Chest x-ray - N/A EKG - 01-19-21 Epic Stress Test - greater than 2 years Epic ECHO - N/A Cardiac Cath - N/A Pacemaker/ICD device last checked: Spinal Cord Stimulator:  Gold weight right eyelid  Bowel Prep - Fleet enema, patient advised  Sleep Study - N/A CPAP -   Fasting Blood Sugar - N/A Checks Blood Sugar _____ times a day  Blood Thinner Instructions:  N/A Aspirin Instructions: Last Dose:  Activity level:   Can go up a flight of stairs and perform activities of daily living without stopping and without symptoms of chest pain or shortness of breath.   Anesthesia review:  N/A  Patient denies shortness of breath, fever, cough and chest pain at PAT appointment  Patient verbalized understanding of instructions that were given to them at the PAT appointment. Patient was also instructed that they will need to review over the PAT instructions again at home before surgery.

## 2021-07-23 ENCOUNTER — Other Ambulatory Visit: Payer: Self-pay

## 2021-07-23 ENCOUNTER — Encounter (HOSPITAL_COMMUNITY): Payer: Self-pay

## 2021-07-23 ENCOUNTER — Encounter (HOSPITAL_COMMUNITY)
Admission: RE | Admit: 2021-07-23 | Discharge: 2021-07-23 | Disposition: A | Discharge status: Home / Self Care | Payer: Medicare Other | Source: Ambulatory Visit | Attending: Urology | Admitting: Urology

## 2021-07-23 DIAGNOSIS — Z01818 Encounter for other preprocedural examination: Secondary | ICD-10-CM | POA: Diagnosis not present

## 2021-07-23 HISTORY — DX: Retention of urine, unspecified: R33.9

## 2021-07-24 DIAGNOSIS — E538 Deficiency of other specified B group vitamins: Secondary | ICD-10-CM | POA: Insufficient documentation

## 2021-07-26 ENCOUNTER — Other Ambulatory Visit: Payer: Self-pay

## 2021-07-26 ENCOUNTER — Ambulatory Visit: Payer: Medicare Other

## 2021-08-03 ENCOUNTER — Other Ambulatory Visit: Payer: Self-pay

## 2021-08-03 ENCOUNTER — Encounter (HOSPITAL_COMMUNITY)
Admission: RE | Admit: 2021-08-03 | Discharge: 2021-08-03 | Disposition: A | Discharge status: Home / Self Care | Payer: Medicare Other | Source: Ambulatory Visit | Attending: Urology | Admitting: Urology

## 2021-08-03 DIAGNOSIS — Z01812 Encounter for preprocedural laboratory examination: Secondary | ICD-10-CM | POA: Insufficient documentation

## 2021-08-03 DIAGNOSIS — Z01818 Encounter for other preprocedural examination: Secondary | ICD-10-CM

## 2021-08-03 DIAGNOSIS — Z20822 Contact with and (suspected) exposure to covid-19: Secondary | ICD-10-CM | POA: Diagnosis not present

## 2021-08-03 LAB — SARS CORONAVIRUS 2 (TAT 6-24 HRS): SARS Coronavirus 2: NEGATIVE

## 2021-08-05 ENCOUNTER — Observation Stay (HOSPITAL_COMMUNITY)
Admission: RE | Admit: 2021-08-05 | Discharge: 2021-08-06 | Disposition: A | Discharge status: Home / Self Care | Payer: Medicare Other | Source: Ambulatory Visit | Attending: Urology | Admitting: Urology

## 2021-08-05 ENCOUNTER — Ambulatory Visit (HOSPITAL_COMMUNITY): Payer: Medicare Other

## 2021-08-05 ENCOUNTER — Encounter (HOSPITAL_COMMUNITY): Payer: Self-pay | Admitting: Urology

## 2021-08-05 ENCOUNTER — Ambulatory Visit (HOSPITAL_BASED_OUTPATIENT_CLINIC_OR_DEPARTMENT_OTHER): Payer: Medicare Other | Admitting: Anesthesiology

## 2021-08-05 ENCOUNTER — Encounter (HOSPITAL_COMMUNITY)
Admission: RE | Disposition: A | Discharge status: Home / Self Care | Payer: Self-pay | Source: Ambulatory Visit | Attending: Urology

## 2021-08-05 ENCOUNTER — Ambulatory Visit (HOSPITAL_COMMUNITY): Payer: Medicare Other | Admitting: Anesthesiology

## 2021-08-05 ENCOUNTER — Other Ambulatory Visit: Payer: Self-pay

## 2021-08-05 DIAGNOSIS — C61 Malignant neoplasm of prostate: Principal | ICD-10-CM | POA: Insufficient documentation

## 2021-08-05 DIAGNOSIS — Z7901 Long term (current) use of anticoagulants: Secondary | ICD-10-CM | POA: Diagnosis not present

## 2021-08-05 DIAGNOSIS — R339 Retention of urine, unspecified: Secondary | ICD-10-CM | POA: Diagnosis not present

## 2021-08-05 DIAGNOSIS — Z7982 Long term (current) use of aspirin: Secondary | ICD-10-CM | POA: Diagnosis not present

## 2021-08-05 DIAGNOSIS — Z79899 Other long term (current) drug therapy: Secondary | ICD-10-CM | POA: Insufficient documentation

## 2021-08-05 DIAGNOSIS — Z96649 Presence of unspecified artificial hip joint: Secondary | ICD-10-CM | POA: Insufficient documentation

## 2021-08-05 DIAGNOSIS — N401 Enlarged prostate with lower urinary tract symptoms: Secondary | ICD-10-CM | POA: Insufficient documentation

## 2021-08-05 DIAGNOSIS — N4289 Other specified disorders of prostate: Secondary | ICD-10-CM | POA: Diagnosis not present

## 2021-08-05 DIAGNOSIS — I10 Essential (primary) hypertension: Secondary | ICD-10-CM | POA: Insufficient documentation

## 2021-08-05 DIAGNOSIS — R338 Other retention of urine: Secondary | ICD-10-CM | POA: Diagnosis not present

## 2021-08-05 DIAGNOSIS — N138 Other obstructive and reflux uropathy: Secondary | ICD-10-CM | POA: Diagnosis not present

## 2021-08-05 DIAGNOSIS — I2510 Atherosclerotic heart disease of native coronary artery without angina pectoris: Secondary | ICD-10-CM | POA: Insufficient documentation

## 2021-08-05 DIAGNOSIS — Z419 Encounter for procedure for purposes other than remedying health state, unspecified: Secondary | ICD-10-CM

## 2021-08-05 DIAGNOSIS — Z01818 Encounter for other preprocedural examination: Secondary | ICD-10-CM

## 2021-08-05 DIAGNOSIS — R972 Elevated prostate specific antigen [PSA]: Secondary | ICD-10-CM | POA: Diagnosis not present

## 2021-08-05 HISTORY — PX: TRANSURETHRAL RESECTION OF PROSTATE: SHX73

## 2021-08-05 SURGERY — TURP (TRANSURETHRAL RESECTION OF PROSTATE)
Anesthesia: General | Site: Prostate

## 2021-08-05 MED ORDER — ORAL CARE MOUTH RINSE
15.0000 mL | Freq: Once | OROMUCOSAL | Status: AC
  Administered 2021-08-05: 15 mL via OROMUCOSAL

## 2021-08-05 MED ORDER — ONDANSETRON HCL 4 MG/2ML IJ SOLN: 4.0000 mg | Freq: Once | INTRAMUSCULAR | Status: DC | PRN

## 2021-08-05 MED ORDER — TRAMADOL HCL 50 MG PO TABS
50.0000 mg | ORAL_TABLET | Freq: Four times a day (QID) | ORAL | Status: DC | PRN
  Administered 2021-08-05: 50 mg via ORAL
  Administered 2021-08-05: 100 mg via ORAL
  Filled 2021-08-05: qty 1

## 2021-08-05 MED ORDER — 0.9 % SODIUM CHLORIDE (POUR BTL) OPTIME
TOPICAL | Status: DC | PRN
  Administered 2021-08-05: 1000 mL

## 2021-08-05 MED ORDER — SODIUM CHLORIDE 0.9 % IR SOLN
Status: DC | PRN
  Administered 2021-08-05 (×4): 3000 mL via INTRAVESICAL
  Administered 2021-08-05: 18000 mL
  Administered 2021-08-05: 3000 mL via INTRAVESICAL

## 2021-08-05 MED ORDER — ONDANSETRON HCL 4 MG/2ML IJ SOLN
INTRAMUSCULAR | Status: DC | PRN
  Administered 2021-08-05: 4 mg via INTRAVENOUS

## 2021-08-05 MED ORDER — ACETAMINOPHEN 325 MG PO TABS: 325.0000 mg | ORAL_TABLET | ORAL | Status: DC | PRN

## 2021-08-05 MED ORDER — KETOROLAC TROMETHAMINE 30 MG/ML IJ SOLN
INTRAMUSCULAR | Status: AC
  Filled 2021-08-05: qty 1

## 2021-08-05 MED ORDER — ETOMIDATE 2 MG/ML IV SOLN
INTRAVENOUS | Status: AC
  Filled 2021-08-05: qty 10

## 2021-08-05 MED ORDER — TRAMADOL HCL 50 MG PO TABS: 50.0000 mg | ORAL_TABLET | Freq: Four times a day (QID) | ORAL | 0 refills | Status: DC | PRN

## 2021-08-05 MED ORDER — MEPERIDINE HCL 50 MG/ML IJ SOLN: 6.2500 mg | INTRAMUSCULAR | Status: DC | PRN

## 2021-08-05 MED ORDER — SODIUM CHLORIDE 0.9 % IR SOLN
3000.0000 mL | Status: DC
  Administered 2021-08-05 – 2021-08-06 (×4): 3000 mL

## 2021-08-05 MED ORDER — PROPOFOL 10 MG/ML IV BOLUS
INTRAVENOUS | Status: DC | PRN
  Administered 2021-08-05: 200 mg via INTRAVENOUS

## 2021-08-05 MED ORDER — SUCCINYLCHOLINE CHLORIDE 200 MG/10ML IV SOSY
PREFILLED_SYRINGE | INTRAVENOUS | Status: AC
  Filled 2021-08-05: qty 10

## 2021-08-05 MED ORDER — ROCURONIUM BROMIDE 10 MG/ML (PF) SYRINGE
PREFILLED_SYRINGE | INTRAVENOUS | Status: AC
  Filled 2021-08-05: qty 20

## 2021-08-05 MED ORDER — EPHEDRINE SULFATE (PRESSORS) 50 MG/ML IJ SOLN: INTRAMUSCULAR | Status: DC | PRN

## 2021-08-05 MED ORDER — DEXAMETHASONE SODIUM PHOSPHATE 4 MG/ML IJ SOLN
INTRAMUSCULAR | Status: DC | PRN
Start: 2021-08-05 — End: 2021-08-05
  Administered 2021-08-05: 5 mg via INTRAVENOUS

## 2021-08-05 MED ORDER — CEFAZOLIN SODIUM-DEXTROSE 2-4 GM/100ML-% IV SOLN
2.0000 g | INTRAVENOUS | Status: AC
  Administered 2021-08-05: 2 g via INTRAVENOUS
  Filled 2021-08-05: qty 100

## 2021-08-05 MED ORDER — HYDRALAZINE HCL 50 MG PO TABS
50.0000 mg | ORAL_TABLET | Freq: Three times a day (TID) | ORAL | Status: DC
  Administered 2021-08-05 – 2021-08-06 (×3): 50 mg via ORAL
  Filled 2021-08-05 (×3): qty 1

## 2021-08-05 MED ORDER — FLEET ENEMA 7-19 GM/118ML RE ENEM: 1.0000 | ENEMA | Freq: Once | RECTAL | Status: DC

## 2021-08-05 MED ORDER — SULFAMETHOXAZOLE-TRIMETHOPRIM 800-160 MG PO TABS
1.0000 | ORAL_TABLET | Freq: Two times a day (BID) | ORAL | 0 refills | Status: DC
Start: 2021-08-05 — End: 2021-10-18

## 2021-08-05 MED ORDER — FENTANYL CITRATE PF 50 MCG/ML IJ SOSY: 25.0000 ug | PREFILLED_SYRINGE | INTRAMUSCULAR | Status: DC | PRN

## 2021-08-05 MED ORDER — SODIUM CHLORIDE 0.45 % IV SOLN: INTRAVENOUS | Status: DC

## 2021-08-05 MED ORDER — EPHEDRINE SULFATE-NACL 50-0.9 MG/10ML-% IV SOSY
PREFILLED_SYRINGE | INTRAVENOUS | Status: DC | PRN
  Administered 2021-08-05 (×2): 2.5 mg via INTRAVENOUS
  Administered 2021-08-05 (×2): 5 mg via INTRAVENOUS

## 2021-08-05 MED ORDER — EPHEDRINE 5 MG/ML INJ
INTRAVENOUS | Status: AC
  Filled 2021-08-05: qty 5

## 2021-08-05 MED ORDER — CEFAZOLIN SODIUM-DEXTROSE 1-4 GM/50ML-% IV SOLN
1.0000 g | Freq: Three times a day (TID) | INTRAVENOUS | Status: DC
  Administered 2021-08-05 – 2021-08-06 (×2): 1 g via INTRAVENOUS
  Filled 2021-08-05 (×3): qty 50

## 2021-08-05 MED ORDER — TRAMADOL HCL 50 MG PO TABS
ORAL_TABLET | ORAL | Status: AC
  Filled 2021-08-05: qty 2

## 2021-08-05 MED ORDER — CHLORHEXIDINE GLUCONATE 0.12 % MT SOLN: 15.0000 mL | Freq: Once | OROMUCOSAL | Status: AC

## 2021-08-05 MED ORDER — GENTAMICIN SULFATE 40 MG/ML IJ SOLN
400.0000 mg | INTRAVENOUS | Status: AC
  Administered 2021-08-05: 400 mg via INTRAVENOUS
  Filled 2021-08-05: qty 10

## 2021-08-05 MED ORDER — OXYCODONE HCL 5 MG/5ML PO SOLN: 5.0000 mg | Freq: Once | ORAL | Status: DC | PRN

## 2021-08-05 MED ORDER — GLYCOPYRROLATE 0.2 MG/ML IJ SOLN
INTRAMUSCULAR | Status: DC | PRN
  Administered 2021-08-05: .2 mg via INTRAVENOUS

## 2021-08-05 MED ORDER — LIDOCAINE HCL (PF) 2 % IJ SOLN
INTRAMUSCULAR | Status: AC
  Filled 2021-08-05: qty 15

## 2021-08-05 MED ORDER — ACETAMINOPHEN 160 MG/5ML PO SOLN: 325.0000 mg | ORAL | Status: DC | PRN

## 2021-08-05 MED ORDER — ZOLPIDEM TARTRATE 5 MG PO TABS
5.0000 mg | ORAL_TABLET | Freq: Every evening | ORAL | Status: DC | PRN
Start: 2021-08-05 — End: 2021-08-06
  Administered 2021-08-05: 5 mg via ORAL
  Filled 2021-08-05: qty 1

## 2021-08-05 MED ORDER — FENTANYL CITRATE (PF) 100 MCG/2ML IJ SOLN
INTRAMUSCULAR | Status: AC
Start: 2021-08-05 — End: ?
  Filled 2021-08-05: qty 2

## 2021-08-05 MED ORDER — LIDOCAINE HCL (CARDIAC) PF 100 MG/5ML IV SOSY
PREFILLED_SYRINGE | INTRAVENOUS | Status: DC | PRN
  Administered 2021-08-05: 80 mg via INTRAVENOUS

## 2021-08-05 MED ORDER — FENTANYL CITRATE (PF) 100 MCG/2ML IJ SOLN
INTRAMUSCULAR | Status: AC
  Filled 2021-08-05: qty 2

## 2021-08-05 MED ORDER — FENTANYL CITRATE (PF) 100 MCG/2ML IJ SOLN
INTRAMUSCULAR | Status: DC | PRN
  Administered 2021-08-05 (×2): 25 ug via INTRAVENOUS
  Administered 2021-08-05: 50 ug via INTRAVENOUS
  Administered 2021-08-05 (×4): 25 ug via INTRAVENOUS

## 2021-08-05 MED ORDER — LACTATED RINGERS IV SOLN: INTRAVENOUS | Status: DC

## 2021-08-05 MED ORDER — OXYCODONE HCL 5 MG PO TABS: 5.0000 mg | ORAL_TABLET | Freq: Once | ORAL | Status: DC | PRN

## 2021-08-05 SURGICAL SUPPLY — 22 items
BAG URINE DRAIN 2000ML AR STRL (UROLOGICAL SUPPLIES) ×2 IMPLANT
BAG URO CATCHER STRL LF (MISCELLANEOUS) ×2 IMPLANT
CATH FOLEY 3WAY 30CC 22FR (CATHETERS) IMPLANT
CATH FOLEY 3WAY 30CC 24FR (CATHETERS)
CATH HEMA 3WAY 30CC 24FR COUDE (CATHETERS) ×1 IMPLANT
CATH URTH STD 24FR FL 3W 2 (CATHETERS) IMPLANT
DRAPE FOOT SWITCH (DRAPES) ×2 IMPLANT
ELECT REM PT RETURN 15FT ADLT (MISCELLANEOUS) ×2 IMPLANT
GLOVE SURG ENC MOIS LTX SZ7.5 (GLOVE) ×2 IMPLANT
GOWN STRL REUS W/TWL XL LVL3 (GOWN DISPOSABLE) ×3 IMPLANT
HOLDER FOLEY CATH W/STRAP (MISCELLANEOUS) IMPLANT
INST BIOPSY MAXCORE 18GX25 (NEEDLE) ×1 IMPLANT
INSTR BIOPSY MAXCORE 18GX20 (NEEDLE) ×1 IMPLANT
KIT TURNOVER KIT A (KITS) IMPLANT
LOOP CUT BIPOLAR 24F LRG (ELECTROSURGICAL) ×2 IMPLANT
MANIFOLD NEPTUNE II (INSTRUMENTS) ×2 IMPLANT
PACK CYSTO (CUSTOM PROCEDURE TRAY) ×2 IMPLANT
SYR 30ML LL (SYRINGE) IMPLANT
SYR TOOMEY IRRIG 70ML (MISCELLANEOUS) ×2
SYRINGE TOOMEY IRRIG 70ML (MISCELLANEOUS) ×1 IMPLANT
TUBING CONNECTING 10 (TUBING) ×2 IMPLANT
TUBING UROLOGY SET (TUBING) ×2 IMPLANT

## 2021-08-05 NOTE — Op Note (Signed)
Preoperative diagnosis:  Bladder outlet obstruction Urinary retention Elevated PSA  Postoperative diagnosis:  same   Procedure:  Transurethral resection of prostate Transrectal ultrasound guided 12-core prostate biopsy  Surgeon: Ardis Hughs, MD   Anesthesia: General   Complications: None   Intraoperative findings:  #1: 12 core prostate biopsy was performed transrectally.  The ultrasound demonstrated no significant median lobe.  There were no hypoechoic areas.  The prostate measured approximately 84 g.  The images were stored in the PACS system. #2: Cystoscopy demonstrated  a normal appearing bladder mucosa catheter associated changes without tumor/stones/or abnormalities.  UOs were orthopic.  Prostate demonstrated a large intravesical median lobe with lateral lobe obstruction.  EBL: 100cc   Specimens: #1: 12 needle cores from the prostate biopsy #2: Prostate chips  Indication: Samuel Fisher is a 76 y.o. patient with bladder outlet obstruction and urinary retention.  In addition, the patient has a history of an elevated PSA that had been rising over the course of the last several years.  After reviewing the management options for treatment, he elected to proceed with the above surgical procedure(s). We have discussed the potential benefits and risks of the procedure, side effects of the proposed treatment, the likelihood of the patient achieving the goals of the procedure, and any potential problems that might occur during the procedure or recuperation. Informed consent has been obtained.  Description of procedure:  The patient was taken to the operating room and general anesthesia was induced.  The patient was placed in the lateral decubitus position and a transrectal ultrasound was placed in his prostate.  We subsequently performed transrectal prostate ultrasound the above findings.  I then proceeded to perform a 12 core prostate biopsy in the routine fashion.  The  patient was then placed in the dorsal lithotomy position, prepped and draped in the usual sterile fashion, and preoperative antibiotics were administered. A preoperative time-out was performed.   I then gently passed the 21 French 30 cystoscope into the patient's urethra and up into the bladder under visual guidance. A 360 cystoscopic evaluation was then performed with the above findings.  I then removed the 21 French cystoscopic sheath and replacement with a 26 French resectoscope sheath was passed and using the visual obturator under direct vision. An exchange the obturator for the resectoscope itself and the loop cautery. I then proceeded to create grooves within the prostate at the 7:00 position extending the defect from the bladder neck at 7:00 down to the prostate apex just lateral to the verumontanum. I took this groove down to the capsule. I then performed a similar maneuver on the patient's left side at the 5:00 position taking the prostate down from the bladder neck to the apex and down the prostatic capsule. At this point I proceeded to resect the patient's right lateral lobe in a systematic fashion moving from the 7:00 position up to approximately 11. The resection was taken down to the prostate capsule. Hemostasis was achieved on this side prior to moving to the patient's left lateral lobe. A similar sequence was performed taking the prostate down from the 5:00 position to approximately the 1:00 position to the level of the prostatic capsule. I then completed the resection of the posterior wall as well as the area between 5:00 and 7:00 along the bladder neck. I was careful not to undermine the bladder neck or resect the inferior. I then did resect the area that was protruding into the prostatic urethra anteriorly.   Once I  was satisfied that the prostate had been adequately resected I evacuated the prostate chips using a Toomey syringe. I then reintroduced the resectoscope and ensured adequate  hemostasis. I then left the bladder full and removed the resectoscope entirely. Exam under anesthesia demonstrated a normal sized prostate with no nodules.  I then placed a 22 French three-way Foley catheter. I irrigated the catheter gently and removed all debris and clots. I then placed the patient on gentle continuous bladder irrigation.  He subsequently extubated and returned to PACU in excellent condition.  Ardis Hughs, MD

## 2021-08-05 NOTE — H&P (Signed)
76 year old male with a history of elevated prostate and voiding symptoms. He has had 2 negative prostate biopsies, the last one was in 09-11-13. His PSA has been rising recently.   When I saw the patient last in the fall Sep 11, 2020 he was doing well with no specific urinary complaints. However around Christmas the patient developed acute urinary retention and was noted to have approximately 3 L of urine in his bladder. He had an elevated creatinine and bilateral hydronephrosis. Foley catheter was placed and is kidney failure resolved. However at that time he was noted to have a DVT and was started on Eliquis.   The patient is subsequently failed 2 voiding trials. He was taught CIC at his last appointment, but was unable to catheterize at home, only getting blood returned. He subsequently went to the ER where another catheter was placed. He presents today for further discussion.   The patient has been taken off Eliquis, his last follow-up demonstrated no residual clot.     ALLERGIES: No Allergies    MEDICATIONS: Finasteride  Sildenafil Citrate 100 mg tablet 1/2 tablet PO Daily PRN  Tamsulosin Hcl 0.4 mg capsule  Advil TABS Oral  Aspirin 325 MG Oral Tablet Oral  Fluticasone Propionate  Hydralazine Hcl     GU PSH: No GU PSH      PSH Notes: Hip Replacement, Hip Replacement, brain tumor removal (08/26/16), leg graft transported to right side of face (08/26/16)   NON-GU PSH: No Non-GU PSH    GU PMH: Urinary Retention - 07/09/2021, - 06/25/2021, - 06/18/2021 Hydronephrosis - 06/18/2021 ED due to arterial insufficiency - 01/05/2021 Elevated PSA - 01/05/2021, (Stable), - Sep 11, 2016, Elevated prostate specific antigen (PSA), - 09/12/2015 Encounter for Prostate Cancer screening - 01/05/2021, - 01/07/2020, 2016-09-11 Atypia of the prostate, Atypical small acinar proliferation of prostate - 2013-09-11 Dysuria, Dysuria - 09/11/12 Unspecified dysplasia of prostate, Prostate dysplasia - 2012/09/11 BPH w/o LUTS, Benign prostatic hypertrophy without  lower urinary tract symptoms - 09-11-2012 HGPIN, PIN (prostatic intraepithelial neoplasia) - 09-11-2012      PMH Notes: 01/2013- Biopsy (PSA 5.01)- atypical glands, 1 core  2013/09/11 - Biospy (PSA 6.82) - atypical, 1/12 core (LMM)  TRUS vol 83 cc (01/2013), 70gm (3/15)   Negative family history of CaP     NON-GU PMH: Encounter for general adult medical examination without abnormal findings, Encounter for preventive health examination - 09/11/13 Personal history of other diseases of the circulatory system, History of hypertension - 11-Sep-2012    FAMILY HISTORY: Death In The Family Father - Father Death In The Family Mother - Mother Family Health Status Number - Father Hypertension - Runs In Family Lung Cancer - Father ovarian cancer - Mother   SOCIAL HISTORY: Marital Status: Married Preferred Language: English; Ethnicity: Not Hispanic Or Latino; Race: White     Notes: Former smoker, Caffeine Use, Marital History - Currently Married, Retired From Work, Alcohol Use   REVIEW OF SYSTEMS:    GU Review Male:   Patient denies frequent urination, hard to postpone urination, burning/ pain with urination, get up at night to urinate, leakage of urine, stream starts and stops, trouble starting your stream, have to strain to urinate , erection problems, and penile pain.  Gastrointestinal (Upper):   Patient denies nausea, vomiting, and indigestion/ heartburn.  Gastrointestinal (Lower):   Patient denies diarrhea and constipation.  Constitutional:   Patient denies fever, night sweats, weight loss, and fatigue.  Skin:   Patient denies skin rash/ lesion and itching.  Eyes:  Patient denies blurred vision and double vision.  Ears/ Nose/ Throat:   Patient denies sore throat and sinus problems.  Hematologic/Lymphatic:   Patient denies swollen glands and easy bruising.  Cardiovascular:   Patient denies leg swelling and chest pains.  Respiratory:   Patient denies cough and shortness of breath.  Endocrine:   Patient denies  excessive thirst.  Musculoskeletal:   Patient denies back pain and joint pain.  Neurological:   Patient denies headaches and dizziness.  Psychologic:   Patient denies depression and anxiety.   VITAL SIGNS:      07/15/2021 12:20 PM  Weight 179 lb / 81.19 kg  Height 68.5 in / 173.99 cm  BP 137/75 mmHg  Pulse 73 /min  Temperature 97.8 F / 36.5 C  BMI 26.8 kg/m   MULTI-SYSTEM PHYSICAL EXAMINATION:    Constitutional: Well-nourished. No physical deformities. Normally developed. Good grooming.  Respiratory: Normal breath sounds. No labored breathing, no use of accessory muscles.   Cardiovascular: Regular rate and rhythm. No murmur, no gallop. Normal temperature, normal extremity pulses, no swelling, no varicosities.      Complexity of Data:  Source Of History:  Patient  Lab Test Review:   PSA  Records Review:   Previous Doctor Records, Previous Hospital Records, Previous Patient Records, POC Tool  Urine Test Review:   Urinalysis   12/29/20 01/02/20 12/26/18 09/18/17 06/29/17 09/12/16 04/27/16 08/26/15  PSA  Total PSA 8.44 ng/mL 6.99 ng/mL 6.08 ng/mL 7.22 ng/mL 8.02 ng/dl 5.71 ng/dl 5.66 ng/dl 6.87   Free PSA  2.28 ng/mL 1.72 ng/mL 2.26 ng/mL  0.95 ng/dl    % Free PSA  33 % PSA 28 % PSA 31 % PSA  17 %      PROCEDURES:         Simple Foley Indwelling Cath Change - 51702  The patient's indwelling foley tube was carefully removed. A 22 French Foley catheter was inserted into the bladder using sterile technique. The patient was taught routine catheter care. Hand irrigation of the bladder with sterile water was performed.   ASSESSMENT:      ICD-10 Details  1 GU:   Elevated PSA - R97.20   2   Urinary Retention - R33.8    PLAN:           Document Letter(s):  Created for Patient: Clinical Summary         Notes:   The patient is in urinary retention and has failed several voiding trials. He also had some complications when trying to perform CIC. He is interested in more aggressive  intervention. We discussed treatment options. I reviewed the gamut of options with him including office-based procedures all the way through simple prostatectomy. Ultimately, I think he would be well served with a TURP. I think we can get him out of retention eventually if we did that. I reviewed the surgery with the patient including the risk and associated benefits. We discussed the expected postoperative course. The patient understands that this will ensure that he voids, but will give him a good chance at voiding eventually. We will plan to keep his catheter in at discharge following this and have him follow-up in clinic about a week later for a voiding trial.   In addition, we discussed the option of performing a prostate biopsy. I think this would be a reasonable thing to do given his PSA elevation as well as the fact that he will already be an under general anesthesia. His last biopsy was  in 2015. I reviewed the risk and the benefits of this as well. He is opted to proceed.

## 2021-08-05 NOTE — Anesthesia Preprocedure Evaluation (Addendum)
Anesthesia Evaluation  Patient identified by MRN, date of birth, ID band Patient awake    Reviewed: Allergy & Precautions, H&P , NPO status , Patient's Chart, lab work & pertinent test results, reviewed documented beta blocker date and time   Airway Mallampati: II  TM Distance: >3 FB Neck ROM: full   Comment: RIGHT sided facial droop secondary to tumor resection. Dental no notable dental hx. (+) Teeth Intact, Dental Advisory Given, Caps, Poor Dentition   Pulmonary neg pulmonary ROS, former smoker,    Pulmonary exam normal breath sounds clear to auscultation       Cardiovascular Exercise Tolerance: Good hypertension, Pt. on medications negative cardio ROS   Rhythm:regular Rate:Normal     Neuro/Psych  Neuromuscular disease negative psych ROS   GI/Hepatic negative GI ROS, Neg liver ROS,   Endo/Other  negative endocrine ROS  Renal/GU negative Renal ROS  negative genitourinary   Musculoskeletal  (+) Arthritis , Osteoarthritis,    Abdominal   Peds  Hematology negative hematology ROS (+) Blood dyscrasia, anemia ,   Anesthesia Other Findings   Reproductive/Obstetrics negative OB ROS                            Anesthesia Physical Anesthesia Plan  ASA: 2  Anesthesia Plan: General   Post-op Pain Management:    Induction: Intravenous  PONV Risk Score and Plan: 2  Airway Management Planned:   Additional Equipment: None  Intra-op Plan:   Post-operative Plan:   Informed Consent: I have reviewed the patients History and Physical, chart, labs and discussed the procedure including the risks, benefits and alternatives for the proposed anesthesia with the patient or authorized representative who has indicated his/her understanding and acceptance.     Dental Advisory Given  Plan Discussed with: CRNA and Anesthesiologist  Anesthesia Plan Comments:         Anesthesia Quick  Evaluation

## 2021-08-05 NOTE — Discharge Instructions (Addendum)

## 2021-08-05 NOTE — Plan of Care (Signed)

## 2021-08-05 NOTE — Anesthesia Procedure Notes (Signed)
Procedure Name: LMA Insertion Date/Time: 08/05/2021 9:59 AM Performed by: Lavina Hamman, CRNA Pre-anesthesia Checklist: Patient identified, Emergency Drugs available, Suction available, Patient being monitored and Timeout performed Patient Re-evaluated:Patient Re-evaluated prior to induction Oxygen Delivery Method: Circle system utilized Preoxygenation: Pre-oxygenation with 100% oxygen Induction Type: IV induction LMA: LMA inserted LMA Size: 4.0 Tube secured with: Tape

## 2021-08-05 NOTE — Transfer of Care (Signed)
Immediate Anesthesia Transfer of Care Note  Patient: Samuel Fisher  Procedure(s) Performed: TRANSURETHRAL RESECTION OF THE PROSTATE (TURP) AND ULTRASOUND GUIDED TRANSRECTAL PROSTATE BIOPSY (Prostate)  Patient Location: PACU  Anesthesia Type:General  Level of Consciousness: awake  Airway & Oxygen Therapy: Patient Spontanous Breathing and Patient connected to face mask oxygen  Post-op Assessment: Report given to RN and Post -op Vital signs reviewed and stable  Post vital signs: Reviewed and stable  Last Vitals:  Vitals Value Taken Time  BP 153/93 08/05/21 1225  Temp    Pulse 69 08/05/21 1226  Resp 12 08/05/21 1226  SpO2 100 % 08/05/21 1226  Vitals shown include unvalidated device data.  Last Pain: There were no vitals filed for this visit.       Complications: No notable events documented.

## 2021-08-05 NOTE — Anesthesia Postprocedure Evaluation (Signed)
Anesthesia Post Note  Patient: Samuel Fisher  Procedure(s) Performed: TRANSURETHRAL RESECTION OF THE PROSTATE (TURP) AND ULTRASOUND GUIDED TRANSRECTAL PROSTATE BIOPSY (Prostate)     Patient location during evaluation: PACU Anesthesia Type: General Level of consciousness: awake and alert Pain management: pain level controlled Vital Signs Assessment: post-procedure vital signs reviewed and stable Respiratory status: spontaneous breathing, nonlabored ventilation, respiratory function stable and patient connected to nasal cannula oxygen Cardiovascular status: blood pressure returned to baseline and stable Postop Assessment: no apparent nausea or vomiting Anesthetic complications: no   No notable events documented.  Last Vitals:  Vitals:   08/05/21 1500 08/05/21 1641  BP: 135/70 136/85  Pulse: 77 80  Resp: 14 18  Temp: 36.6 C 36.6 C  SpO2: 100% 100%    Last Pain:  Vitals:   08/05/21 2000  TempSrc:   PainSc: [name]

## 2021-08-05 NOTE — Interval H&P Note (Signed)
History and Physical Interval Note:  08/05/2021 9:43 AM  Samuel Fisher  has presented today for surgery, with the diagnosis of ELEVATED PSA URINATY RETENTION.  The various methods of treatment have been discussed with the patient and family. After consideration of risks, benefits and other options for treatment, the patient has consented to  Procedure(s): TRANSURETHRAL RESECTION OF THE PROSTATE (TURP) AND PROSTATE BIOPSY (N/A) as a surgical intervention.  The patient's history has been reviewed, patient examined, no change in status, stable for surgery.  I have reviewed the patient's chart and labs.  Questions were answered to the patient's satisfaction.     Ardis Hughs

## 2021-08-06 ENCOUNTER — Encounter (HOSPITAL_COMMUNITY): Payer: Self-pay | Admitting: Urology

## 2021-08-06 DIAGNOSIS — I2510 Atherosclerotic heart disease of native coronary artery without angina pectoris: Secondary | ICD-10-CM | POA: Diagnosis not present

## 2021-08-06 DIAGNOSIS — Z7901 Long term (current) use of anticoagulants: Secondary | ICD-10-CM | POA: Diagnosis not present

## 2021-08-06 DIAGNOSIS — I10 Essential (primary) hypertension: Secondary | ICD-10-CM | POA: Diagnosis not present

## 2021-08-06 DIAGNOSIS — N138 Other obstructive and reflux uropathy: Secondary | ICD-10-CM | POA: Diagnosis not present

## 2021-08-06 DIAGNOSIS — N401 Enlarged prostate with lower urinary tract symptoms: Secondary | ICD-10-CM | POA: Diagnosis not present

## 2021-08-06 DIAGNOSIS — Z96649 Presence of unspecified artificial hip joint: Secondary | ICD-10-CM | POA: Diagnosis not present

## 2021-08-06 DIAGNOSIS — R338 Other retention of urine: Secondary | ICD-10-CM | POA: Diagnosis not present

## 2021-08-06 DIAGNOSIS — Z7982 Long term (current) use of aspirin: Secondary | ICD-10-CM | POA: Diagnosis not present

## 2021-08-06 DIAGNOSIS — C61 Malignant neoplasm of prostate: Secondary | ICD-10-CM | POA: Diagnosis not present

## 2021-08-06 DIAGNOSIS — Z79899 Other long term (current) drug therapy: Secondary | ICD-10-CM | POA: Diagnosis not present

## 2021-08-06 LAB — BASIC METABOLIC PANEL
Anion gap: 5 (ref 5–15)
BUN: 15 mg/dL (ref 8–23)
CO2: 25 mmol/L (ref 22–32)
Calcium: 8.5 mg/dL — ABNORMAL LOW (ref 8.9–10.3)
Chloride: 104 mmol/L (ref 98–111)
Creatinine, Ser: 1.09 mg/dL (ref 0.61–1.24)
GFR, Estimated: >60 mL/min (ref >60)
Glucose, Bld: 125 mg/dL — ABNORMAL HIGH (ref 70–99)
Potassium: 3.7 mmol/L (ref 3.5–5.1)
Sodium: 134 mmol/L — ABNORMAL LOW (ref 135–145)

## 2021-08-06 LAB — CBC
HCT: 25.7 % — ABNORMAL LOW (ref 39.0–52.0)
Hemoglobin: 8.3 g/dL — ABNORMAL LOW (ref 13.0–17.0)
MCH: 31.2 pg (ref 26.0–34.0)
MCHC: 32.3 g/dL (ref 30.0–36.0)
MCV: 96.6 fL (ref 80.0–100.0)
Platelets: 167 10*3/uL (ref 150–400)
RBC: 2.66 MIL/uL — ABNORMAL LOW (ref 4.22–5.81)
RDW: 13.7 % (ref 11.5–15.5)
WBC: 9.2 10*3/uL (ref 4.0–10.5)
nRBC: 0 % (ref 0.0–0.2)

## 2021-08-06 LAB — SURGICAL PATHOLOGY

## 2021-08-06 MED ORDER — CHLORHEXIDINE GLUCONATE CLOTH 2 % EX PADS
6.0000 | MEDICATED_PAD | Freq: Every day | CUTANEOUS | Status: DC
  Administered 2021-08-06: 6 via TOPICAL

## 2021-08-06 NOTE — Discharge Summary (Signed)
Date of admission: 08/05/2021  Date of discharge: 08/06/2021  Admission diagnosis: BPH and urinary retention  Discharge diagnosis: same  Secondary diagnoses:  Patient Active Problem List   Diagnosis Date Noted   Urinary retention 08/05/2021   B12 deficiency 07/24/2021   Obstructive uropathy 06/18/2021   Hyperlipidemia LDL goal <70 04/22/2021   CAD in native artery 06/10/2019   Cystic mass of pancreas 06/06/2019   Chronic hyponatremia 05/28/2019   Vitamin D deficiency 01/15/2018   B12 deficiency anemia 07/15/2016   Benign schwannoma 07/14/2016   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   Osteoarthritis of hip 01/28/2013   Essential hypertension, benign 06/28/2012   Pure hypercholesterolemia 06/28/2012   BPH (benign prostatic hyperplasia) 06/28/2012   Routine general medical examination at a health care facility 06/28/2012   PSA elevation 06/28/2012    Procedures performed: Procedure(s): TRANSURETHRAL RESECTION OF THE PROSTATE (TURP) AND ULTRASOUND GUIDED TRANSRECTAL PROSTATE BIOPSY  History and Physical: For full details, please see admission history and physical. Briefly, Samuel Fisher is a 76 y.o. year old patient with urinary retention secondary to enlarged prostate.   Hospital Course: Patient tolerated the procedure well.  He was then transferred to the floor after an uneventful PACU stay.  His hospital course was uncomplicated.  On POD#1 he had met discharge criteria: was eating a regular diet, was up and ambulating independently,  pain was well controlled,   and was ready to for discharge.  AT the time of discharge his foley was draining blood tinged urine but it was fairly light and quickly irrigated clear when the CBI was turned back on.  AS such I recommended he go home and maintain a light activity restriction over the next 24 hours.   Laboratory values:  Recent Labs    08/06/21 0341  WBC 9.2  HGB 8.3*  HCT 25.7*   Recent Labs    08/06/21 0341  NA 134*  K 3.7  CL  104  CO2 25  GLUCOSE 125*  BUN 15  CREATININE 1.09  CALCIUM 8.5*   No results for input(s): LABPT, INR in the last 72 hours. No results for input(s): LABURIN in the last 72 hours. Results for orders placed or performed during the hospital encounter of 08/03/21  SARS CORONAVIRUS 2 (TAT 6-24 HRS) Nasopharyngeal Nasopharyngeal Swab     Status: None   Collection Time: 08/03/21  6:58 AM   Specimen: Nasopharyngeal Swab  Result Value Ref Range Status   SARS Coronavirus 2 NEGATIVE NEGATIVE Final    Comment: (NOTE) SARS-CoV-2 target nucleic acids are NOT DETECTED.  The SARS-CoV-2 RNA is generally detectable in upper and lower respiratory specimens during the acute phase of infection. Negative results do not preclude SARS-CoV-2 infection, do not rule out co-infections with other pathogens, and should not be used as the sole basis for treatment or other patient management decisions. Negative results must be combined with clinical observations, patient history, and epidemiological information. The expected result is Negative.  Fact Sheet for Patients: SugarRoll.be  Fact Sheet for Healthcare Providers: https://www.woods-mathews.com/  This test is not yet approved or cleared by the [place] FDA and  has been authorized for detection and/or diagnosis of SARS-CoV-2 by FDA under an Emergency Use Authorization (EUA). This EUA will remain  in effect (meaning this test can be used) for the duration of the COVID-19 declaration under Se ction 564(b)(1) of the Act, 21 U.S.C. section 360bbb-3(b)(1), unless the authorization is terminated or revoked sooner.  Performed at [facility], [address] [place], [place] [address]     Disposition: Home  Discharge instruction: The patient was instructed to be ambulatory but told to refrain from heavy lifting, strenuous activity, or driving.   Discharge medications:  Allergies as of 08/06/2021        Reactions   Bee Venom Anaphylaxis   Allergic to Bee Sting -unknown reaction        Medication List     STOP taking these medications    finasteride 5 MG tablet Commonly known as: PROSCAR   tamsulosin 0.4 MG Caps capsule Commonly known as: FLOMAX       TAKE these medications    Carboxymethylcellulose Sodium 1 % Gel Place 1 drop into both eyes daily.   fluticasone 50 MCG/ACT nasal spray Commonly known as: FLONASE Place 1-2 sprays into both nostrils daily as needed for allergies.   hydrALAZINE 50 MG tablet Commonly known as: APRESOLINE Take 1 tablet (50 mg total) by mouth 3 (three) times daily.   sulfamethoxazole-trimethoprim 800-160 MG tablet Commonly known as: BACTRIM DS Take 1 tablet by mouth 2 (two) times daily. Start taking one day prior to your appointment for your first follow-up and catheter removal.  Continue taking for three days.   traMADol 50 MG tablet Commonly known as: Ultram Take 1-2 tablets (50-100 mg total) by mouth every 6 (six) hours as needed for moderate pain.   VITAMIN B-12 IJ Inject 1,000 mcg as directed every 30 (thirty) days.        Followup:   Follow-up Information     Karen Kays, NP Follow up on 08/11/2021.   Specialty: Nurse Practitioner Why: 8:45 Contact information: [address] [place] [address] [phone]

## 2021-08-06 NOTE — Progress Notes (Signed)
Urology Inpatient Progress Report  Urinary retention [R33.9]  Procedure(s): TRANSURETHRAL RESECTION OF THE PROSTATE (TURP) AND ULTRASOUND GUIDED TRANSRECTAL PROSTATE BIOPSY  1 Day Post-Op   Intv/Subj: [name] is without complaint.  Principal Problem:   Urinary retention  Current Facility-Administered Medications  Medication Dose Route Frequency Provider Last Rate Last Admin   0.45 % sodium chloride infusion   Intravenous Continuous Ardis Hughs, MD 100 mL/hr at 08/06/21 0453 New Bag at 08/06/21 0453   ceFAZolin (ANCEF) IVPB 1 g/50 mL premix  1 g Intravenous Q8H Ardis Hughs, MD 100 mL/hr at 08/06/21 0612 1 g at 08/06/21 7741   Chlorhexidine Gluconate Cloth 2 % PADS 6 each  6 each Topical Daily Ardis Hughs, MD       hydrALAZINE (APRESOLINE) tablet 50 mg  50 mg Oral TID Ardis Hughs, MD   50 mg at 08/05/21 2145   sodium chloride irrigation 0.9 % 3,000 mL  3,000 mL Irrigation Continuous Ardis Hughs, MD   3,000 mL at 08/06/21 0230   traMADol (ULTRAM) tablet 50-100 mg  50-100 mg Oral Q6H PRN Ardis Hughs, MD   50 mg at 08/05/21 2302   zolpidem (AMBIEN) tablet 5 mg  5 mg Oral QHS PRN,MR X 1 Ardis Hughs, MD   5 mg at 08/05/21 2302     Objective: Vital: Vitals:   08/05/21 1500 08/05/21 1641 08/05/21 1838 08/06/21 0111  BP: 135/70 136/85  (!) 143/71  Pulse: 77 80  67  Resp: 14 18    Temp: 97.8 F (36.6 C) 97.9 F (36.6 C)  97.7 F (36.5 C)  TempSrc:  Oral  Oral  SpO2: 100% 100%  99%  Weight:   78 kg   Height:   5\' 8"  (1.727 m)    I/Os: I/O last 3 completed shifts: In: 8385.3 [P.O.:240; I.V.:1985.2; Other:6000; IV Piggyback:160] Out: 20850 [Urine:20850]  Physical Exam:  General: Patient is in no apparent distress Lungs: Normal respiratory effort, chest expands symmetrically. GI: The abdomen is soft and nontender without mass. Foley: straw colored urine on slow CBI  Ext: lower extremities  symmetric  Lab Results: Recent Labs    08/06/21 0341  WBC 9.2  HGB 8.3*  HCT 25.7*   Recent Labs    08/06/21 0341  NA 134*  K 3.7  CL 104  CO2 25  GLUCOSE 125*  BUN 15  CREATININE 1.09  CALCIUM 8.5*   No results for input(s): LABPT, INR in the last 72 hours. No results for input(s): LABURIN in the last 72 hours. Results for orders placed or performed during the hospital encounter of 08/03/21  SARS CORONAVIRUS 2 (TAT 6-24 HRS) Nasopharyngeal Nasopharyngeal Swab     Status: None   Collection Time: 08/03/21  6:58 AM   Specimen: Nasopharyngeal Swab  Result Value Ref Range Status   SARS Coronavirus 2 NEGATIVE NEGATIVE Final    Comment: (NOTE) SARS-CoV-2 target nucleic acids are NOT DETECTED.  The SARS-CoV-2 RNA is generally detectable in upper and lower respiratory specimens during the acute phase of infection. Negative results do not preclude SARS-CoV-2 infection, do not rule out co-infections with other pathogens, and should not be used as the sole basis for treatment or other patient management decisions. Negative results must be combined with clinical observations, patient history, and epidemiological information. The expected result is Negative.  Fact Sheet for Patients: SugarRoll.be  Fact Sheet for Healthcare Providers: https://www.woods-mathews.com/  This test is not yet approved or  cleared by the [place] and  has been authorized for detection and/or diagnosis of SARS-CoV-2 by FDA under an Emergency Use Authorization (EUA). This EUA will remain  in effect (meaning this test can be used) for the duration of the COVID-19 declaration under Se ction 564(b)(1) of the Act, 21 U.S.C. section 360bbb-3(b)(1), unless the authorization is terminated or revoked sooner.  Performed at [facility], [place] [address], [place], [place] [address]     Studies/Results: [place] Intraoperative  Result Date:  08/05/2021 CLINICAL DATA:  Ultrasound was provided for use by the ordering physician.  No provider Interpretation or professional fees incurred.     Assessment: Procedure(s): TRANSURETHRAL RESECTION OF THE PROSTATE (TURP) AND ULTRASOUND GUIDED TRANSRECTAL PROSTATE BIOPSY, 1 Day Post-Op  doing well.  Plan: Wean CBI, re-eval with CBI off for several hours. Hope for discharge before noon.   Louis Meckel, MD Urology 08/06/2021, 7:25 AM

## 2021-08-19 DIAGNOSIS — R338 Other retention of urine: Secondary | ICD-10-CM | POA: Diagnosis not present

## 2021-10-18 ENCOUNTER — Ambulatory Visit (INDEPENDENT_AMBULATORY_CARE_PROVIDER_SITE_OTHER): Payer: Medicare Other | Admitting: Internal Medicine

## 2021-10-18 ENCOUNTER — Encounter: Payer: Self-pay | Admitting: Internal Medicine

## 2021-10-18 VITALS — BP 148/72 | HR 91 | Temp 98.1°F | Ht 68.0 in | Wt 174.0 lb

## 2021-10-18 DIAGNOSIS — I10 Essential (primary) hypertension: Secondary | ICD-10-CM

## 2021-10-18 DIAGNOSIS — Z23 Encounter for immunization: Secondary | ICD-10-CM

## 2021-10-18 DIAGNOSIS — D518 Other vitamin B12 deficiency anemias: Secondary | ICD-10-CM

## 2021-10-18 MED ORDER — SHINGRIX 50 MCG/0.5ML IM SUSR: 0.5000 mL | Freq: Once | INTRAMUSCULAR | 1 refills | Status: AC

## 2021-10-18 MED ORDER — CYANOCOBALAMIN 1000 MCG/ML IJ SOLN
1000.0000 ug | Freq: Once | INTRAMUSCULAR | Status: AC
  Administered 2021-10-18: 1000 ug via INTRAMUSCULAR

## 2021-10-18 NOTE — Progress Notes (Signed)
? ?Subjective:  ?Patient ID: Samuel Fisher, male    DOB: 04/29/1946  Age: 76 y.o. MRN: [id number] ? ?CC: Anemia and Hypertension ? ? ?HPI ?Samuel Fisher presents for f/up - ? ?He exercises and does not experience chest pain, shortness of breath, diaphoresis, edema, dizziness, or lightheadedness. ? ?Outpatient Medications Prior to Visit  ?Medication Sig Dispense Refill  ? Cyanocobalamin (VITAMIN B-12 IJ) Inject 1,000 mcg as directed every 30 (thirty) days.    ? fluticasone (FLONASE) 50 MCG/ACT nasal spray Place 1-2 sprays into both nostrils daily as needed for allergies.  2  ? hydrALAZINE (APRESOLINE) 50 MG tablet Take 1 tablet (50 mg total) by mouth 3 (three) times daily. 270 tablet 0  ? Carboxymethylcellulose Sodium 1 % GEL Place 1 drop into both eyes daily.    ? sulfamethoxazole-trimethoprim (BACTRIM DS) 800-160 MG tablet Take 1 tablet by mouth 2 (two) times daily. Start taking one day prior to your appointment for your first follow-up and catheter removal.  Continue taking for three days. 6 tablet 0  ? traMADol (ULTRAM) 50 MG tablet Take 1-2 tablets (50-100 mg total) by mouth every 6 (six) hours as needed for moderate pain. 10 tablet 0  ? ?No facility-administered medications prior to visit.  ? ? ?ROS ?Review of Systems  ?Constitutional: Negative.  Negative for diaphoresis and fatigue.  ?HENT: Negative.    ?Eyes: Negative.   ?Respiratory:  Negative for cough, chest tightness, shortness of breath and wheezing.   ?Cardiovascular:  Negative for chest pain, palpitations and leg swelling.  ?Gastrointestinal:  Negative for abdominal pain, diarrhea and nausea.  ?Endocrine: Negative.   ?Genitourinary: Negative.  Negative for difficulty urinating, dysuria and hematuria.  ?Musculoskeletal: Negative.   ?Skin: Negative.   ?Neurological:  Negative for dizziness, weakness and light-headedness.  ?Hematological:  Negative for adenopathy. Does not bruise/bleed easily.  ?Psychiatric/Behavioral: Negative.    ? ?Objective:   ?BP (!) 148/72 (BP Location: Right Arm, Patient Position: Sitting, Cuff Size: Large)   Pulse 91   Temp 98.1 ?F (36.7 ?C) (Oral)   Ht '5\' 8"'$  (1.727 m)   Wt 174 lb (78.9 kg)   SpO2 96%   BMI 26.46 kg/m?  ? ?BP Readings from Last 3 Encounters:  ?10/18/21 (!) 148/72  ?08/06/21 114/74  ?07/23/21 139/84  ? ? ?Wt Readings from Last 3 Encounters:  ?10/18/21 174 lb (78.9 kg)  ?08/05/21 171 lb 15.3 oz (78 kg)  ?07/23/21 171 lb 3.2 oz (77.7 kg)  ? ? ?Physical Exam ?Vitals reviewed.  ?HENT:  ?   Nose: Nose normal.  ?   Mouth/Throat:  ?   Mouth: Mucous membranes are moist.  ?Eyes:  ?   General: No scleral icterus. ?   Conjunctiva/sclera: Conjunctivae normal.  ?Cardiovascular:  ?   Rate and Rhythm: Normal rate and regular rhythm.  ?   Heart sounds: No murmur heard. ?Pulmonary:  ?   Effort: Pulmonary effort is normal.  ?   Breath sounds: No stridor. No wheezing, rhonchi or rales.  ?Abdominal:  ?   General: Abdomen is flat.  ?   Palpations: There is no mass.  ?   Tenderness: There is no abdominal tenderness. There is no guarding.  ?   Hernia: No hernia is present.  ?Musculoskeletal:     ?   General: Normal range of motion.  ?   Cervical back: Neck supple.  ?   Right lower leg: No edema.  ?   Left lower leg: No edema.  ?Lymphadenopathy:  ?  Cervical: No cervical adenopathy.  ?Skin: ?   General: Skin is warm and dry.  ?Neurological:  ?   General: No focal deficit present.  ?   Mental Status: He is alert.  ?Psychiatric:     ?   Mood and Affect: Mood normal.     ?   Behavior: Behavior normal.  ? ? ?Lab Results  ?Component Value Date  ? WBC 7.9 10/18/2021  ? HGB 13.0 10/18/2021  ? HCT 38.5 (L) 10/18/2021  ? PLT 206.0 10/18/2021  ? GLUCOSE 125 (H) 08/06/2021  ? CHOL 159 01/20/2021  ? TRIG 70.0 01/20/2021  ? HDL 62.40 01/20/2021  ? [place] 82 01/20/2021  ? ALT 17 07/10/2021  ? AST 19 07/10/2021  ? NA 134 (L) 08/06/2021  ? K 3.7 08/06/2021  ? CL 104 08/06/2021  ? CREATININE 1.09 08/06/2021  ? BUN 15 08/06/2021  ? CO2 25 08/06/2021   ? TSH 2.57 01/20/2021  ? PSA 8.44 12/29/2020  ? HGBA1C 5.7 01/20/2021  ? ? ?[place] Intraoperative ? ?Result Date: 08/05/2021 ?CLINICAL DATA:  Ultrasound was provided for use by the ordering physician.  No provider Interpretation or professional fees incurred.   ? ? ?Assessment & Plan:  ? ?Samuel Fisher was seen today for anemia and hypertension. ? ?Diagnoses and all orders for this visit: ? ?Essential hypertension, benign- His blood pressure is well controlled. ?-     CBC with Differential/Platelet; Future ?-     CBC with Differential/Platelet ? ?Other vitamin B12 deficiency anemia- His H&H have improved.  Will continue parenteral B12 replacement therapy. ?-     CBC with Differential/Platelet; Future ?-     cyanocobalamin ((VITAMIN B-12)) injection 1,000 mcg ?-     CBC with Differential/Platelet ? ?Need for prophylactic vaccination and inoculation against varicella ?-     Zoster Vaccine Adjuvanted [facility]) injection; Inject 0.5 mLs into the muscle once for 1 dose. ? ? ?I have discontinued Samuel Fisher's Carboxymethylcellulose Sodium, sulfamethoxazole-trimethoprim, and traMADol. I am also having him start on Shingrix. Additionally, I am having him maintain his fluticasone, Cyanocobalamin (VITAMIN B-12 IJ), and hydrALAZINE. We administered cyanocobalamin. ? ?Meds ordered this encounter  ?Medications  ? Zoster Vaccine Adjuvanted [facility]) injection  ?  Sig: Inject 0.5 mLs into the muscle once for 1 dose.  ?  Dispense:  0.5 mL  ?  Refill:  1  ? cyanocobalamin ((VITAMIN B-12)) injection 1,000 mcg  ? ? ? ?Follow-up: Return in about 6 months (around 04/20/2022). ? ?Scarlette Calico, MD ?

## 2021-10-18 NOTE — Patient Instructions (Signed)
Vitamin B12 Deficiency Vitamin B12 deficiency means that your body does not have enough vitamin B12. The body needs this important vitamin: To make red blood cells. To make genes (DNA). To help the nerves work. If you do not have enough vitamin B12 in your body, you can have health problems, such as not having enough red blood cells in the blood (anemia). What are the causes? Not eating enough foods that contain vitamin B12. Not being able to take in (absorb) vitamin B12 from the food that you eat. Certain diseases. A condition in which the body does not make enough of a certain protein. This results in your body not taking in enough vitamin B12. Having a surgery in which part of the stomach or small intestine is taken out. Taking medicines that make it hard for the body to take in vitamin B12. These include: Heartburn medicines. Some medicines that are used to treat diabetes. What increases the risk? Being an older adult. Eating a vegetarian or vegan diet that does not include any foods that come from animals. Not eating enough foods that contain vitamin B12 while you are pregnant. Taking certain medicines. Having alcoholism. What are the signs or symptoms? In some cases, there are no symptoms. If the condition leads to too few blood cells or nerve damage, symptoms can occur, such as: Feeling weak or tired. Not being hungry. Losing feeling (numbness) or tingling in your hands and feet. Redness and burning of the tongue. Feeling sad (depressed). Confusion or memory problems. Trouble walking. If anemia is very bad, symptoms can include: Being short of breath. Being dizzy. Having a very fast heartbeat. How is this treated? Changing the way you eat and drink, such as: Eating more foods that contain vitamin B12. Drinking little or no alcohol. Getting vitamin B12 shots. Taking vitamin B12 supplements by mouth (orally). Your doctor will tell you the dose that is best for you. Follow  these instructions at home: Eating and drinking  Eat foods that come from animals and have a lot of vitamin B12 in them. These include: Meats and poultry. This includes beef, pork, chicken, turkey, and organ meats, such as liver. Seafood, such as clams, rainbow trout, salmon, tuna, and haddock. Eggs. Dairy foods such as milk, yogurt, and cheese. Eat breakfast cereals that have vitamin B12 added to them (are fortified). Check the label. The items listed above may not be a complete list of foods and beverages you can eat and drink. Contact a dietitian for more information. Alcohol use Do not drink alcohol if: Your doctor tells you not to drink. You are pregnant, may be pregnant, or are planning to become pregnant. If you drink alcohol: Limit how much you have to: 0-1 drink a day for women. 0-2 drinks a day for men. Know how much alcohol is in your drink. In the [place] one drink equals one 12 oz bottle of beer (355 mL), one 5 oz glass of wine (148 mL), or one 1 oz glass of hard liquor (44 mL). General instructions Get any vitamin B12 shots if told by your doctor. Take supplements only as told by your doctor. Follow the directions. Keep all follow-up visits. Contact a doctor if: Your symptoms come back. Your symptoms get worse or do not get better with treatment. Get help right away if: You have trouble breathing. You have a very fast heartbeat. You have chest pain. You get dizzy. You faint. These symptoms may be an emergency. Get help right away. Call 911.   Do not wait to see if the symptoms will go away. Do not drive yourself to the hospital. Summary Vitamin B12 deficiency means that your body is not getting enough of the vitamin. In some cases, there are no symptoms of this condition. Treatment may include making a change in the way you eat and drink, getting shots, or taking supplements. Eat foods that have vitamin B12 in them. This information is not intended to replace advice  given to you by your health care provider. Make sure you discuss any questions you have with your health care provider. Document Revised: 01/29/2021 Document Reviewed: 01/29/2021 Elsevier Patient Education  2023 [employer]  

## 2021-10-19 LAB — CBC WITH DIFFERENTIAL/PLATELET
Basophils Absolute: 0 10*3/uL (ref 0.0–0.1)
Basophils Relative: 0.6 % (ref 0.0–3.0)
Eosinophils Absolute: 0.2 10*3/uL (ref 0.0–0.7)
Eosinophils Relative: 2 % (ref 0.0–5.0)
HCT: 38.5 % — ABNORMAL LOW (ref 39.0–52.0)
Hemoglobin: 13 g/dL (ref 13.0–17.0)
Lymphocytes Relative: 16.8 % (ref 12.0–46.0)
Lymphs Abs: 1.3 10*3/uL (ref 0.7–4.0)
MCHC: 33.9 g/dL (ref 30.0–36.0)
MCV: 90.1 fl (ref 78.0–100.0)
Monocytes Absolute: 0.8 10*3/uL (ref 0.1–1.0)
Monocytes Relative: 10 % (ref 3.0–12.0)
Neutro Abs: 5.6 10*3/uL (ref 1.4–7.7)
Neutrophils Relative %: 70.6 % (ref 43.0–77.0)
Platelets: 206 10*3/uL (ref 150.0–400.0)
RBC: 4.27 Mil/uL (ref 4.22–5.81)
RDW: 14.3 % (ref 11.5–15.5)
WBC: 7.9 10*3/uL (ref 4.0–10.5)

## 2021-11-16 ENCOUNTER — Other Ambulatory Visit: Payer: Self-pay | Admitting: Internal Medicine

## 2021-11-16 DIAGNOSIS — I10 Essential (primary) hypertension: Secondary | ICD-10-CM

## 2021-12-13 ENCOUNTER — Ambulatory Visit (INDEPENDENT_AMBULATORY_CARE_PROVIDER_SITE_OTHER): Payer: Medicare Other

## 2021-12-13 DIAGNOSIS — E538 Deficiency of other specified B group vitamins: Secondary | ICD-10-CM | POA: Diagnosis not present

## 2021-12-13 MED ORDER — CYANOCOBALAMIN 1000 MCG/ML IJ SOLN
1000.0000 ug | Freq: Once | INTRAMUSCULAR | Status: AC
  Administered 2021-12-13: 1000 ug via INTRAMUSCULAR

## 2021-12-28 ENCOUNTER — Other Ambulatory Visit (HOSPITAL_COMMUNITY): Payer: Self-pay | Admitting: Urology

## 2021-12-28 ENCOUNTER — Other Ambulatory Visit: Payer: Self-pay | Admitting: Urology

## 2021-12-28 DIAGNOSIS — C61 Malignant neoplasm of prostate: Secondary | ICD-10-CM

## 2022-01-14 ENCOUNTER — Ambulatory Visit (INDEPENDENT_AMBULATORY_CARE_PROVIDER_SITE_OTHER): Payer: Medicare Other

## 2022-01-14 DIAGNOSIS — Z0000 Encounter for general adult medical examination without abnormal findings: Secondary | ICD-10-CM | POA: Diagnosis not present

## 2022-01-14 NOTE — Progress Notes (Signed)
I connected with Samuel Fisher today by telephone and verified that I am speaking with the correct person using two identifiers. Location patient: home Location provider: work Persons participating in the virtual visit: patient, provider.   I discussed the limitations, risks, security and privacy concerns of performing an evaluation and management service by telephone and the availability of in person appointments. I also discussed with the patient that there may be a patient responsible charge related to this service. The patient expressed understanding and verbally consented to this telephonic visit.    Interactive audio and video telecommunications were attempted between this provider and patient, however failed, due to patient having technical difficulties OR patient did not have access to video capability.  We continued and completed visit with audio only.  Some vital signs may be absent or patient reported.   Time Spent with patient on telephone encounter: 30 minutes  Subjective:   Samuel Fisher is a 76 y.o. male who presents for Medicare Annual/Subsequent preventive examination.  Review of Systems     Cardiac Risk Factors include: advanced age (>69mn, >>60women);dyslipidemia;family history of premature cardiovascular disease;hypertension;male gender     Objective:    There were no vitals filed for this visit. There is no height or weight on file to calculate BMI.     01/14/2022    3:40 PM 08/05/2021    8:04 AM 07/23/2021    8:18 AM 07/10/2021    6:16 AM 06/10/2021    6:00 AM 06/10/2021   12:07 AM 03/17/2020    3:26 PM  Advanced Directives  Does Patient Have a Medical Advance Directive? Yes Yes Yes No No No Yes  Type of Advance Directive Living will;Healthcare Power of Attorney Living will;Healthcare Power of [place]    Living will;Healthcare Power of Attorney  Does patient want to make changes to medical advance directive? No  - Patient declined No - Patient declined     No - Patient declined  Copy of [place]? No - copy requested No - copy requested No - copy requested    No - copy requested  Would patient like information on creating a medical advance directive?    No - Patient declined No - Patient declined      Current Medications (verified) Outpatient Encounter Medications as of 01/14/2022  Medication Sig   Cyanocobalamin (VITAMIN B-12 IJ) Inject 1,000 mcg as directed every 30 (thirty) days.   fluticasone (FLONASE) 50 MCG/ACT nasal spray Place 1-2 sprays into both nostrils daily as needed for allergies.   hydrALAZINE (APRESOLINE) 50 MG tablet TAKE ONE TABLET BY MOUTH THREE TIMES DAILY   No facility-administered encounter medications on file as of 01/14/2022.    Allergies (verified) Bee venom   History: Past Medical History:  Diagnosis Date   Allergy    Arthritis    BPH (benign prostatic hyperplasia) 2010   [place]    Hearing problem    Hyperlipidemia    Hypertension    Inner ear dysfunction    Urinary retention    Past Surgical History:  Procedure Laterality Date   [place]    right-2002 and left-2004, 2010   TRANSURETHRAL RESECTION OF PROSTATE N/A 08/05/2021   Procedure: TRANSURETHRAL RESECTION OF THE PROSTATE (TURP) AND ULTRASOUND GUIDED TRANSRECTAL PROSTATE BIOPSY;  Surgeon: HArdis Hughs MD;  Location:  [facility];  Service: Urology;  Laterality: N/A;   WISDOM TOOTH EXTRACTION     Bottom two   Family History  Problem Relation Age of Onset   Uterine cancer Mother    Rheum arthritis Mother    Hypertension Father    Throat cancer Father        smoker   Arthritis Other    Heart disease Other    Hypertension Other    Stroke Other    Early death Other    Mental illness Other    Heart attack Brother        smoker   Heart attack Maternal Grandfather    Hypertension  Brother    Stroke Brother    Diabetes Neg Hx    Hyperlipidemia Neg Hx    Social History   Socioeconomic History   Marital status: Married    Spouse name: Not on file   Number of children: 2   Years of education: Not on file   Highest education level: Not on file  Occupational History   Occupation: retired  Tobacco Use   Smoking status: Former    Types: Cigarettes    Quit date: 06/28/1976    Years since quitting: 45.5   Smokeless tobacco: Never  Vaping Use   Vaping Use: Never used  Substance and Sexual Activity   Alcohol use: Yes    Alcohol/week: 3.0 standard drinks of alcohol    Types: 3 Cans of beer per week    Comment: 2-3 beers per day and occasional drink on the weekends   Drug use: No   Sexual activity: Yes  Other Topics Concern   Not on file  Social History Narrative   Not on file   Social Determinants of Health   Financial Resource Strain: Low Risk  (01/14/2022)   Overall Financial Resource Strain (CARDIA)    Difficulty of Paying Living Expenses: Not hard at all  Food Insecurity: No Food Insecurity (01/14/2022)   Hunger Vital Sign    Worried About Running Out of Food in the Last Year: Never true    Ran Out of Food in the Last Year: Never true  Transportation Needs: No Transportation Needs (01/14/2022)   PRAPARE - Hydrologist (Medical): No    Lack of Transportation (Non-Medical): No  Physical Activity: Sufficiently Active (01/14/2022)   Exercise Vital Sign    Days of Exercise per Week: 5 days    Minutes of Exercise per Session: 60 min  Stress: No Stress Concern Present (01/14/2022)   [place]    Feeling of Stress : Not at all  Social Connections: [place] (01/14/2022)   Social Connection and Isolation Panel [NHANES]    Frequency of Communication with Friends and Family: More than three times a week    Frequency of Social Gatherings with Friends and  Family: More than three times a week    Attends Religious Services: More than 4 times per year    Active Member of [employer] or Organizations: Yes    Attends Music therapist: More than 4 times per year    Marital Status: Married    Tobacco Counseling Counseling given: Not Answered   Clinical Intake:  Pre-visit preparation completed: Yes  Pain : No/denies pain     BMI - recorded: 26.46 (10/18/2021) Nutritional Status: BMI 25 -29 Overweight Nutritional Risks: None Diabetes: No  How often do you need to have someone help  you when you read instructions, pamphlets, or other written materials from your doctor or pharmacy?: 1 - Never What is the last grade level you completed in school?: Degree from [employer]  Diabetic?  no  Interpreter Needed?: No  Information entered by :: Lisette Abu, LPN   Activities of Daily Living    01/14/2022    4:05 PM 08/05/2021    5:00 PM  In your present state of health, do you have any difficulty performing the following activities:  Hearing? 1 1  Vision? 0 0  Difficulty concentrating or making decisions? 0 0  Walking or climbing stairs? 0 0  Dressing or bathing? 0 0  Doing errands, shopping? 0 0  Preparing Food and eating ? N   Using the Toilet? N   In the past six months, have you accidently leaked urine? N   Do you have problems with loss of bowel control? N   Managing your Medications? N   Managing your Finances? N   Housekeeping or managing your Housekeeping? N     Patient Care Team: [name], MD as PCP - General (Internal Medicine)  Indicate any recent Medical Services you may have received from other than [facility] providers in the past year (date may be approximate).     Assessment:   This is a routine wellness examination for Khai.  Hearing/Vision screen Hearing Screening - Comments:: Patient wears hearing aids. Vision Screening - Comments:: Patient does wear corrective lenses/contacts.  Eye exam done by:  [facility]   Dietary issues and exercise activities discussed: Current Exercise Habits: Structured exercise class, Type of exercise: walking, Time (Minutes): 60, Frequency (Times/Week): 5, Weekly Exercise (Minutes/Week): 300, Intensity: Moderate, Exercise limited by: None identified   Goals Addressed             This Visit's Progress    My goal is to stay physically active, maintain my health and continue to eat healthy.        Depression Screen    01/14/2022    3:47 PM 07/21/2021    1:29 PM 03/17/2020    3:28 PM 12/31/2018    5:02 PM 07/19/2017   11:54 AM 07/17/2016   11:43 AM 04/07/2015    7:34 AM  PHQ 2/9 Scores  PHQ - 2 Score 0 0 0 0 0 0 0    Fall Risk    01/14/2022    3:42 PM 06/18/2021    1:19 PM 03/17/2020    3:27 PM 12/31/2018    5:02 PM 07/19/2017   11:54 AM  [place] in the past year? '1 1 1 '$ 0 No  Number falls in past yr: 0 0 0 0   Injury with Fall? 0 1 0 0   Risk for fall due to : No Fall Risks  Impaired balance/gait    Risk for fall due to: Comment   due to dizziness    Follow up Falls evaluation completed  Falls evaluation completed;Education provided Falls evaluation completed     Parkway:  Any stairs in or around the home? Yes  If so, are there any without handrails? No  Home free of loose throw rugs in walkways, pet beds, electrical cords, etc? Yes  Adequate lighting in your home to reduce risk of falls? Yes   ASSISTIVE DEVICES UTILIZED TO PREVENT FALLS:  Life alert? No  Use of a cane, walker or w/c? Yes  Grab bars in the bathroom? No  Shower chair or bench in shower? No  Elevated toilet seat or a handicapped toilet? Yes   TIMED UP AND GO:  Was the test performed? No .  Length of time to ambulate 10 feet: n/a sec.   Appearance of gait: Gait not evaluated during this visit.  Cognitive Function:        01/14/2022    4:06 PM  6CIT Screen  What Year? 0 points  What month? 0 points   What time? 0 points  Count back from 20 0 points  Months in reverse 0 points  Repeat phrase 0 points  Total Score 0 points    Immunizations Immunization History  Administered Date(s) Administered   Fluad Quad(high Dose 65+) 03/18/2019, 04/16/2020, 05/10/2021   Influenza Split 06/28/2012   Influenza, High Dose Seasonal PF 03/12/2014, 07/14/2016, 07/17/2017, 07/05/2018   Influenza,inj,Quad PF,6+ Mos 06/27/2013, 04/06/2015   Moderna SARS-COV2 Booster Vaccination 06/18/2020   Moderna Sars-Covid-2 Vaccination 08/15/2019, 09/12/2019   Pneumococcal Conjugate-13 04/06/2015   Pneumococcal Polysaccharide-23 07/17/2017   Tdap 02/01/2007, 07/14/2016, 06/10/2021   Zoster Recombinat (Shingrix) 11/16/2021    TDAP status: Up to date  Flu Vaccine status: Up to date  Pneumococcal vaccine status: Up to date  Covid-19 vaccine status: Completed vaccines  Qualifies for Shingles Vaccine? Yes   Zostavax completed No   Shingrix Completed?: No.    Education has been provided regarding the importance of this vaccine. Patient has been advised to call insurance company to determine out of pocket expense if they have not yet received this vaccine. Advised may also receive vaccine at local pharmacy or Health Dept. Verbalized acceptance and understanding.  Screening Tests Health Maintenance  Topic Date Due   Hepatitis C Screening  Never done   COVID-19 Vaccine (3 - Moderna risk series) 07/16/2020   Zoster Vaccines- Shingrix (2 of 2) 01/11/2022   INFLUENZA VACCINE  01/18/2022   Fecal DNA (Cologuard)  11/25/2022   TETANUS/TDAP  06/11/2031   Pneumonia Vaccine 68+ Years old  Completed   HPV VACCINES  Aged Out   COLONOSCOPY (Pts 45-67yr Insurance coverage will need to be confirmed)  Discontinued    Health Maintenance  Health Maintenance Due  Topic Date Due   Hepatitis C Screening  Never done   COVID-19 Vaccine (3 - Moderna risk series) 07/16/2020   Zoster Vaccines- Shingrix (2 of 2) 01/11/2022     Colorectal cancer screening: Type of screening: Cologuard. Completed 11/25/2019. Repeat every 3 years  Lung Cancer Screening: (Low Dose CT Chest recommended if Age 74-80years, 30 pack-year currently smoking OR have quit w/in 15years.) does not qualify.   Lung Cancer Screening Referral: no  Additional Screening:  Hepatitis C Screening: does qualify; Completed no  Vision Screening: Recommended annual ophthalmology exams for early detection of glaucoma and other disorders of the eye. Is the patient up to date with their annual eye exam?  Yes  Who is the provider or what is the name of the office [facility] which the patient [facility]ends annual [facility] exams[facility] If pt is not established with a provider, would they like to be referred to a provider to establish care? No .   Dental Screening: Recommended annual dental exams for proper oral hygiene  Community Resource Referral / Chronic Care Management: CRR required this visit?  No   CCM required this visit?  No      Plan:     I have personally reviewed and noted the following in the patient's chart:   Medical and  social history Use of alcohol, tobacco or illicit drugs  Current medications and supplements including opioid prescriptions. Patient is not currently taking opioid prescriptions. Functional ability and status Nutritional status Physical activity Advanced directives List of other physicians Hospitalizations, surgeries, and ER visits in previous 12 months Vitals Screenings to include cognitive, depression, and falls Referrals and appointments  In addition, I have reviewed and discussed with patient certain preventive protocols, quality metrics, and best practice recommendations. A written personalized care plan for preventive services as well as general preventive health recommendations were provided to patient.     Sheral Flow, LPN   09/20/7541   Nurse Notes:  Patient is cogitatively intact. There  were no vitals filed for this visit. There is no height or weight on file to calculate BMI.

## 2022-01-14 NOTE — Patient Instructions (Signed)
Samuel Fisher , Thank you for taking time to come for your Medicare Wellness Visit. I appreciate your ongoing commitment to your health goals. Please review the following plan we discussed and let me know if I can assist you in the future.   Screening recommendations/referrals: Cologuard: 11/25/2019; due every 3 years Recommended yearly ophthalmology/optometry visit for glaucoma screening and checkup Recommended yearly dental visit for hygiene and checkup  Vaccinations: Influenza vaccine: 05/10/2021 Pneumococcal vaccine: 04/06/2015, 07/17/2017 Tdap vaccine: 06/10/2021 Shingles vaccine: 11/16/2021; need second dose 2-6 months apart and over 90% effective    Covid-19: 08/15/2019, 09/12/2019, 06/18/2020  Advanced directives: Yes  Conditions/risks identified: Yes  Next appointment: Please schedule your next Medicare Wellness Visit with your Nurse Health Advisor in 1 year by calling [phone].  Preventive Care 74 Years and Older, Male Preventive care refers to lifestyle choices and visits with your health care provider that can promote health and wellness. What does preventive care include? A yearly physical exam. This is also called an annual well check. Dental exams once or twice a year. Routine eye exams. Ask your health care provider how often you should have your eyes checked. Personal lifestyle choices, including: Daily care of your teeth and gums. Regular physical activity. Eating a healthy diet. Avoiding tobacco and drug use. Limiting alcohol use. Practicing safe sex. Taking low doses of aspirin every day. Taking vitamin and mineral supplements as recommended by your health care provider. What happens during an annual well check? The services and screenings done by your health care provider during your annual well check will depend on your age, overall health, lifestyle risk factors, and family history of disease. Counseling  Your health care provider may ask you questions about  your: Alcohol use. Tobacco use. Drug use. Emotional well-being. Home and relationship well-being. Sexual activity. Eating habits. History of falls. Memory and ability to understand (cognition). Work and work Statistician. Screening  You may have the following tests or measurements: Height, weight, and BMI. Blood pressure. Lipid and cholesterol levels. These may be checked every 5 years, or more frequently if you are over 85 years old. Skin check. Lung cancer screening. You may have this screening every year starting at age 74 if you have a 30-pack-year history of smoking and currently smoke or have quit within the past 15 years. Fecal occult blood test (FOBT) of the stool. You may have this test every year starting at age 74. Flexible sigmoidoscopy or colonoscopy. You may have a sigmoidoscopy every 5 years or a colonoscopy every 10 years starting at age 74. Prostate cancer screening. Recommendations will vary depending on your family history and other risks. Hepatitis C blood test. Hepatitis B blood test. Sexually transmitted disease (STD) testing. Diabetes screening. This is done by checking your blood sugar (glucose) after you have not eaten for a while (fasting). You may have this done every 1-3 years. Abdominal aortic aneurysm (AAA) screening. You may need this if you are a current or former smoker. Osteoporosis. You may be screened starting at age 74 if you are at high risk. Talk with your health care provider about your test results, treatment options, and if necessary, the need for more tests. Vaccines  Your health care provider may recommend certain vaccines, such as: Influenza vaccine. This is recommended every year. Tetanus, diphtheria, and acellular pertussis (Tdap, Td) vaccine. You may need a Td booster every 10 years. Zoster vaccine. You may need this after age 74. Pneumococcal 13-valent conjugate (PCV13) vaccine. One dose is recommended after  age 74. Pneumococcal  polysaccharide (PPSV23) vaccine. One dose is recommended after age 74. Talk to your health care provider about which screenings and vaccines you need and how often you need them. This information is not intended to replace advice given to you by your health care provider. Make sure you discuss any questions you have with your health care provider. Document Released: 07/03/2015 Document Revised: 02/24/2016 Document Reviewed: 04/07/2015 Elsevier Interactive Patient Education  2017 [employer] Prevention in the Home Falls can cause injuries. They can happen to people of all ages. There are many things you can do to make your home safe and to help prevent falls. What can I do on the outside of my home? Regularly fix the edges of walkways and driveways and fix any cracks. Remove anything that might make you trip as you walk through a door, such as a raised step or threshold. Trim any bushes or trees on the path to your home. Use bright outdoor lighting. Clear any walking paths of anything that might make someone trip, such as rocks or tools. Regularly check to see if handrails are loose or broken. Make sure that both sides of any steps have handrails. Any raised decks and porches should have guardrails on the edges. Have any leaves, snow, or ice cleared regularly. Use sand or salt on walking paths during winter. Clean up any spills in your garage right away. This includes oil or grease spills. What can I do in the bathroom? Use night lights. Install grab bars by the toilet and in the tub and shower. Do not use towel bars as grab bars. Use non-skid mats or decals in the tub or shower. If you need to sit down in the shower, use a plastic, non-slip stool. Keep the floor dry. Clean up any water that spills on the floor as soon as it happens. Remove soap buildup in the tub or shower regularly. Attach bath mats securely with double-sided non-slip rug tape. Do not have throw rugs and other  things on the floor that can make you trip. What can I do in the bedroom? Use night lights. Make sure that you have a light by your bed that is easy to reach. Do not use any sheets or blankets that are too big for your bed. They should not hang down onto the floor. Have a firm chair that has side arms. You can use this for support while you get dressed. Do not have throw rugs and other things on the floor that can make you trip. What can I do in the kitchen? Clean up any spills right away. Avoid walking on wet floors. Keep items that you use a lot in easy-to-reach places. If you need to reach something above you, use a strong step stool that has a grab bar. Keep electrical cords out of the way. Do not use floor polish or wax that makes floors slippery. If you must use wax, use non-skid floor wax. Do not have throw rugs and other things on the floor that can make you trip. What can I do with my stairs? Do not leave any items on the stairs. Make sure that there are handrails on both sides of the stairs and use them. Fix handrails that are broken or loose. Make sure that handrails are as long as the stairways. Check any carpeting to make sure that it is firmly attached to the stairs. Fix any carpet that is loose or worn. Avoid having throw rugs  at the top or bottom of the stairs. If you do have throw rugs, attach them to the floor with carpet tape. Make sure that you have a light switch at the top of the stairs and the bottom of the stairs. If you do not have them, ask someone to add them for you. What else can I do to help prevent falls? Wear shoes that: Do not have high heels. Have rubber bottoms. Are comfortable and fit you well. Are closed at the toe. Do not wear sandals. If you use a stepladder: Make sure that it is fully opened. Do not climb a closed stepladder. Make sure that both sides of the stepladder are locked into place. Ask someone to hold it for you, if possible. Clearly  mark and make sure that you can see: Any grab bars or handrails. First and last steps. Where the edge of each step is. Use tools that help you move around (mobility aids) if they are needed. These include: Canes. Walkers. Scooters. Crutches. Turn on the lights when you go into a dark area. Replace any light bulbs as soon as they burn out. Set up your furniture so you have a clear path. Avoid moving your furniture around. If any of your floors are uneven, fix them. If there are any pets around you, be aware of where they are. Review your medicines with your doctor. Some medicines can make you feel dizzy. This can increase your chance of falling. Ask your doctor what other things that you can do to help prevent falls. This information is not intended to replace advice given to you by your health care provider. Make sure you discuss any questions you have with your health care provider. Document Released: 04/02/2009 Document Revised: 11/12/2015 Document Reviewed: 07/11/2014 Elsevier Interactive Patient Education  2017 [employer].

## 2022-02-24 ENCOUNTER — Ambulatory Visit (HOSPITAL_COMMUNITY)
Admission: RE | Admit: 2022-02-24 | Discharge: 2022-02-24 | Disposition: A | Discharge status: Home / Self Care | Payer: Medicare Other | Source: Ambulatory Visit | Attending: Urology | Admitting: Urology

## 2022-02-24 DIAGNOSIS — C61 Malignant neoplasm of prostate: Secondary | ICD-10-CM | POA: Insufficient documentation

## 2022-02-24 DIAGNOSIS — Z96643 Presence of artificial hip joint, bilateral: Secondary | ICD-10-CM | POA: Diagnosis not present

## 2022-02-24 DIAGNOSIS — R590 Localized enlarged lymph nodes: Secondary | ICD-10-CM | POA: Diagnosis not present

## 2022-02-24 MED ORDER — GADOBUTROL 1 MMOL/ML IV SOLN
8.0000 mL | Freq: Once | INTRAVENOUS | Status: AC | PRN
  Administered 2022-02-24: 8 mL via INTRAVENOUS

## 2022-02-28 DIAGNOSIS — C61 Malignant neoplasm of prostate: Secondary | ICD-10-CM | POA: Diagnosis not present

## 2022-03-02 ENCOUNTER — Ambulatory Visit: Payer: Self-pay | Admitting: Licensed Clinical Social Worker

## 2022-03-02 NOTE — Patient Outreach (Signed)
  Care Coordination   Initial Visit Note   03/02/2022 Name: Samuel Fisher MRN: [id number] DOB: 09/17/1945  Samuel Fisher is a 76 y.o. year old male who sees [name], MD for primary care. I spoke with  Rushie Chestnut by phone today.  What matters to the patients health and wellness today?      Goals Addressed             This Visit's Progress    Care Coordination Activities       Care Coordination Interventions: Reviewed Care Coordination Services:agreed to schedule phone appointment           SDOH assessments and interventions completed:  No    Care Coordination Interventions Activated:  Yes  Care Coordination Interventions:  Yes, provided   Follow up plan: Follow up call scheduled for 03/15/2022    Encounter Outcome:  Pt. Visit Completed   Casimer Lanius, [place] [phone]

## 2022-03-02 NOTE — Patient Instructions (Signed)
     It was a pleasure speaking with you today. Per your request your phone appointment is scheduled 03/15/2022  Casimer Lanius, [place] [phone]

## 2022-03-07 DIAGNOSIS — R338 Other retention of urine: Secondary | ICD-10-CM | POA: Diagnosis not present

## 2022-03-07 DIAGNOSIS — C61 Malignant neoplasm of prostate: Secondary | ICD-10-CM | POA: Diagnosis not present

## 2022-03-15 ENCOUNTER — Telehealth: Payer: Self-pay | Admitting: Licensed Clinical Social Worker

## 2022-03-15 ENCOUNTER — Encounter: Payer: Self-pay | Admitting: Licensed Clinical Social Worker

## 2022-03-15 NOTE — Patient Outreach (Signed)
  Care Coordination   03/15/2022 Name: CALDER OBLINGER MRN: [id number] DOB: 04-23-1946   Care Coordination Outreach Attempts:  An unsuccessful telephone outreach was attempted for a scheduled appointment today.  Follow Up Plan:  Additional outreach attempts will be made to offer the patient care coordination information and services.   Encounter Outcome:  Pt. Scheduled  Care Coordination Interventions Activated:  No   Care Coordination Interventions:  No, not indicated    Casimer Lanius, [place] [phone]

## 2022-03-15 NOTE — Patient Instructions (Signed)
    I am sorry you were unable to keep your phone appointment today.   Per your request a Care Coordination phone appointment is scheduled 03/16/22  Casimer Lanius, [place] [phone]

## 2022-03-16 ENCOUNTER — Ambulatory Visit: Payer: Self-pay | Admitting: Licensed Clinical Social Worker

## 2022-03-16 NOTE — Patient Outreach (Signed)
  Care Coordination  Initial Visit Note   03/16/2022 Name: MACLANE HOLLORAN MRN: [id number] DOB: 13-Jan-1946  ROI JAFARI is a 76 y.o. year old male who sees [name], MD for primary care. I spoke with  Rushie Chestnut by phone today.  What matters to the patients health and wellness today?    Patient reports no concerns or needs from Care Coordination team with health and wellness related to physical or mental heath. .   Recommendation: Patient may benefit from, and is in agreement to review EMMI education for stress management.     Goals Addressed             This Visit's Progress    COMPLETED: Care Coordination Activities no follow up required       Care Coordination Interventions: Reviewed Care Coordination Services:agreed to schedule phone appointment Discussed benefits of Medicare Annual Wellness Visit: completed  Concerns with obtaining required medications: none Assessed Social Determinants of Health Provided EMMI education on Relieving Stress and managing anxiety around daily task           SDOH assessments and interventions completed:  Yes  SDOH Interventions Today    Flowsheet Row Most Recent Value  SDOH Interventions   Housing Interventions Intervention Not Indicated  Transportation Interventions Intervention Not Indicated  Utilities Interventions Intervention Not Indicated  Stress Interventions Provide Counseling        Care Coordination Interventions Activated:  Yes  Care Coordination Interventions:  Yes, provided   Follow up plan: No further intervention required.   Encounter Outcome:  Pt. Visit Completed   Casimer Lanius, [place] [phone]

## 2022-03-16 NOTE — Patient Instructions (Signed)
Visit Information  Thank you for taking time to visit with me today. Please don't hesitate to contact me if I can be of assistance to you.   Following are the goals we discussed today:   Goals Addressed             This Visit's Progress    COMPLETED: Care Coordination Activities no follow up required       Care Coordination Interventions: Reviewed Care Coordination Services:agreed to schedule phone appointment Discussed benefits of Medicare Annual Wellness Visit: completed  Concerns with obtaining required medications: none Assessed Social Determinants of Health Provided EMMI education on Relieving Stress and managing anxiety around daily task           Patient verbalizes understanding of instructions and care plan provided today and agrees to view in [place]. Active MyChart status and patient understanding of how to access instructions and care plan via MyChart confirmed with patient.     No further follow up required: by Care Coordination at this time  Care Coordination team works in collaboration with your primary care doctor.  Please call [phone] if you would like to schedule a phone appointment with a Nurse or Social work Care Coordinator to assist with navigating your physical and mental health needs.     Casimer Lanius, [place] [phone]

## 2022-03-21 DIAGNOSIS — H2513 Age-related nuclear cataract, bilateral: Secondary | ICD-10-CM | POA: Diagnosis not present

## 2022-03-21 DIAGNOSIS — G510 Bell's palsy: Secondary | ICD-10-CM | POA: Diagnosis not present

## 2022-03-23 ENCOUNTER — Ambulatory Visit (INDEPENDENT_AMBULATORY_CARE_PROVIDER_SITE_OTHER): Payer: Medicare Other

## 2022-03-23 DIAGNOSIS — E538 Deficiency of other specified B group vitamins: Secondary | ICD-10-CM

## 2022-03-23 MED ORDER — CYANOCOBALAMIN 1000 MCG/ML IJ SOLN
1000.0000 ug | Freq: Once | INTRAMUSCULAR | Status: AC
  Administered 2022-03-23: 1000 ug via INTRAMUSCULAR

## 2022-03-23 NOTE — Progress Notes (Signed)
After obtaining consent, and per orders of Dr. Jones, injection of B12 given in the left deltoid by Camesha Farooq P Shanell Aden. Patient instructed to report any adverse reaction to me immediately.  

## 2022-04-06 ENCOUNTER — Other Ambulatory Visit: Payer: Self-pay | Admitting: Internal Medicine

## 2022-04-06 DIAGNOSIS — I10 Essential (primary) hypertension: Secondary | ICD-10-CM

## 2022-05-06 ENCOUNTER — Ambulatory Visit (INDEPENDENT_AMBULATORY_CARE_PROVIDER_SITE_OTHER): Payer: Medicare Other

## 2022-05-06 DIAGNOSIS — E538 Deficiency of other specified B group vitamins: Secondary | ICD-10-CM

## 2022-05-06 MED ORDER — CYANOCOBALAMIN 1000 MCG/ML IJ SOLN
1000.0000 ug | INTRAMUSCULAR | Status: AC
  Administered 2022-05-06 – 2023-02-15 (×3): 1000 ug via INTRAMUSCULAR

## 2022-05-06 NOTE — Progress Notes (Signed)
B12 given.  Pt tolerated well. Pt is aware to give the office a call for an side effects or reactions. Please co-sign.   

## 2022-06-07 ENCOUNTER — Ambulatory Visit (INDEPENDENT_AMBULATORY_CARE_PROVIDER_SITE_OTHER): Payer: Medicare Other

## 2022-06-07 DIAGNOSIS — Z23 Encounter for immunization: Secondary | ICD-10-CM | POA: Diagnosis not present

## 2022-06-07 NOTE — Progress Notes (Signed)
Pt was given HD Flu vacc w/o any complications.

## 2022-08-17 ENCOUNTER — Other Ambulatory Visit: Payer: Self-pay | Admitting: Internal Medicine

## 2022-08-17 DIAGNOSIS — I10 Essential (primary) hypertension: Secondary | ICD-10-CM

## 2022-08-19 ENCOUNTER — Telehealth: Payer: Self-pay | Admitting: Internal Medicine

## 2022-08-19 ENCOUNTER — Other Ambulatory Visit: Payer: Self-pay | Admitting: Internal Medicine

## 2022-08-19 DIAGNOSIS — I10 Essential (primary) hypertension: Secondary | ICD-10-CM

## 2022-08-19 MED ORDER — HYDRALAZINE HCL 50 MG PO TABS: 50.0000 mg | ORAL_TABLET | Freq: Three times a day (TID) | ORAL | 0 refills | Status: DC

## 2022-08-19 NOTE — Telephone Encounter (Signed)
Caller & Relationship to patient: PT  Call back number:   Date of last office visit: 10/18/21  Date of next office visit: 08/23/22  Medication(s) to be refilled:  hydrALAZINE (APRESOLINE) 50 MG tablet   Preferred Pharmacy:   [facility], [place] requesting partial as he won't have enough to get him through to his appointment on Tuesday.

## 2022-08-23 ENCOUNTER — Ambulatory Visit (INDEPENDENT_AMBULATORY_CARE_PROVIDER_SITE_OTHER): Payer: Medicare Other | Admitting: Internal Medicine

## 2022-08-23 ENCOUNTER — Encounter: Payer: Self-pay | Admitting: Internal Medicine

## 2022-08-23 VITALS — BP 124/82 | HR 85 | Temp 98.2°F | Resp 16 | Ht 68.0 in | Wt 171.0 lb

## 2022-08-23 DIAGNOSIS — D518 Other vitamin B12 deficiency anemias: Secondary | ICD-10-CM | POA: Diagnosis not present

## 2022-08-23 DIAGNOSIS — I499 Cardiac arrhythmia, unspecified: Secondary | ICD-10-CM | POA: Diagnosis not present

## 2022-08-23 DIAGNOSIS — E785 Hyperlipidemia, unspecified: Secondary | ICD-10-CM | POA: Diagnosis not present

## 2022-08-23 DIAGNOSIS — Z23 Encounter for immunization: Secondary | ICD-10-CM

## 2022-08-23 DIAGNOSIS — Z0000 Encounter for general adult medical examination without abnormal findings: Secondary | ICD-10-CM | POA: Diagnosis not present

## 2022-08-23 DIAGNOSIS — N1831 Chronic kidney disease, stage 3a: Secondary | ICD-10-CM

## 2022-08-23 DIAGNOSIS — I10 Essential (primary) hypertension: Secondary | ICD-10-CM | POA: Diagnosis not present

## 2022-08-23 DIAGNOSIS — Z0001 Encounter for general adult medical examination with abnormal findings: Secondary | ICD-10-CM

## 2022-08-23 DIAGNOSIS — I2510 Atherosclerotic heart disease of native coronary artery without angina pectoris: Secondary | ICD-10-CM

## 2022-08-23 LAB — LIPID PANEL
Cholesterol: 156 mg/dL (ref 0–200)
HDL: 73.9 mg/dL (ref >39.00)
LDL Cholesterol: 70 mg/dL (ref 0–99)
NonHDL: 82.32
Total CHOL/HDL Ratio: 2
Triglycerides: 62 mg/dL (ref 0.0–149.0)
VLDL: 12.4 mg/dL (ref 0.0–40.0)

## 2022-08-23 LAB — BASIC METABOLIC PANEL
BUN: 20 mg/dL (ref 6–23)
CO2: 26 mEq/L (ref 19–32)
Calcium: 10.3 mg/dL (ref 8.4–10.5)
Chloride: 100 mEq/L (ref 96–112)
Creatinine, Ser: 1.24 mg/dL (ref 0.40–1.50)
GFR: 57.09 mL/min — ABNORMAL LOW (ref >60.00)
Glucose, Bld: 88 mg/dL (ref 70–99)
Potassium: 4.7 mEq/L (ref 3.5–5.1)
Sodium: 137 mEq/L (ref 135–145)

## 2022-08-23 LAB — CBC WITH DIFFERENTIAL/PLATELET
Basophils Absolute: 0.1 10*3/uL (ref 0.0–0.1)
Basophils Relative: 0.9 % (ref 0.0–3.0)
Eosinophils Absolute: 0.2 10*3/uL (ref 0.0–0.7)
Eosinophils Relative: 2.4 % (ref 0.0–5.0)
HCT: 42 % (ref 39.0–52.0)
Hemoglobin: 14.2 g/dL (ref 13.0–17.0)
Lymphocytes Relative: 18.3 % (ref 12.0–46.0)
Lymphs Abs: 1.4 10*3/uL (ref 0.7–4.0)
MCHC: 33.8 g/dL (ref 30.0–36.0)
MCV: 93.7 fl (ref 78.0–100.0)
Monocytes Absolute: 0.8 10*3/uL (ref 0.1–1.0)
Monocytes Relative: 10.9 % (ref 3.0–12.0)
Neutro Abs: 5.2 10*3/uL (ref 1.4–7.7)
Neutrophils Relative %: 67.5 % (ref 43.0–77.0)
Platelets: 260 10*3/uL (ref 150.0–400.0)
RBC: 4.48 Mil/uL (ref 4.22–5.81)
RDW: 13.4 % (ref 11.5–15.5)
WBC: 7.7 10*3/uL (ref 4.0–10.5)

## 2022-08-23 LAB — HEPATIC FUNCTION PANEL
ALT: 17 U/L (ref 0–53)
AST: 25 U/L (ref 0–37)
Albumin: 4.4 g/dL (ref 3.5–5.2)
Alkaline Phosphatase: 59 U/L (ref 39–117)
Bilirubin, Direct: 0.2 mg/dL (ref 0.0–0.3)
Total Bilirubin: 0.9 mg/dL (ref 0.2–1.2)
Total Protein: 7.4 g/dL (ref 6.0–8.3)

## 2022-08-23 LAB — VITAMIN B12: Vitamin B-12: 432 pg/mL (ref 211–911)

## 2022-08-23 LAB — TSH: TSH: 2.22 u[IU]/mL (ref 0.35–5.50)

## 2022-08-23 LAB — FOLATE: Folate: 15.2 ng/mL (ref >5.9)

## 2022-08-23 NOTE — Progress Notes (Signed)
Subjective:  Patient ID: KDEN SEEBER, male    DOB: 05-14-1946  Age: 77 y.o. MRN: [id number]  CC: Annual Exam, Hypertension, and Hyperlipidemia   HPI Samuel Fisher presents for a CPX and f/up ----  He exercises on a Cybex on a treadmill.  He has good endurance and denies chest pain, shortness of breath, diaphoresis, or edema.  He continues to complain of dizziness and ataxia.  Outpatient Medications Prior to Visit  Medication Sig Dispense Refill   Cyanocobalamin (VITAMIN B-12 IJ) Inject 1,000 mcg as directed every 30 (thirty) days.     fluticasone (FLONASE) 50 MCG/ACT nasal spray Place 1-2 sprays into both nostrils daily as needed for allergies.  2   hydrALAZINE (APRESOLINE) 50 MG tablet Take 1 tablet (50 mg total) by mouth 3 (three) times daily. 270 tablet 0   Facility-Administered Medications Prior to Visit  Medication Dose Route Frequency Provider Last Rate Last Admin   cyanocobalamin (VITAMIN B12) injection 1,000 mcg  1,000 mcg Intramuscular Q30 days [name], MD   1,000 mcg at 05/06/22 1001    ROS Review of Systems  Constitutional: Negative.  Negative for chills, diaphoresis, fatigue and fever.  HENT: Negative.    Eyes: Negative.   Respiratory:  Negative for cough, chest tightness, shortness of breath and wheezing.   Cardiovascular:  Negative for chest pain, palpitations and leg swelling.  Gastrointestinal:  Negative for abdominal pain, diarrhea, nausea and vomiting.  Endocrine: Negative.   Genitourinary: Negative.  Negative for difficulty urinating.  Musculoskeletal:  Positive for gait problem. Negative for back pain.  Skin: Negative.   Neurological:  Positive for dizziness. Negative for syncope, weakness and light-headedness.  Hematological:  Negative for adenopathy. Does not bruise/bleed easily.  Psychiatric/Behavioral: Negative.      Objective:  BP 124/82 (BP Location: Left Arm, Patient Position: Sitting, Cuff Size: Normal)   Pulse 85   Temp 98.2  F (36.8 C) (Oral)   Resp 16   Ht '5\' 8"'$  (1.727 m)   Wt 171 lb (77.6 kg)   SpO2 98%   BMI 26.00 kg/m   BP Readings from Last 3 Encounters:  08/23/22 124/82  10/18/21 (!) 148/72  08/06/21 114/74    Wt Readings from Last 3 Encounters:  08/23/22 171 lb (77.6 kg)  10/18/21 174 lb (78.9 kg)  08/05/21 171 lb 15.3 oz (78 kg)    Physical Exam Vitals reviewed.  HENT:     Nose: Nose normal.     Mouth/Throat:     Mouth: Mucous membranes are moist.  Eyes:     General: No scleral icterus.    Conjunctiva/sclera: Conjunctivae normal.  Cardiovascular:     Rate and Rhythm: Normal rate. Rhythm irregularly irregular.     Heart sounds: Murmur heard.     Systolic murmur is present with a grade of 1/6.     No friction rub. No gallop.     Comments: EKG--- NSR, 85 bpm Incomplete RBBB Septal infarct pattern Q wave in III Unchanged  Pulmonary:     Effort: Pulmonary effort is normal.     Breath sounds: No stridor. No wheezing, rhonchi or rales.  Abdominal:     General: Abdomen is flat.     Palpations: There is no mass.     Tenderness: There is no abdominal tenderness. There is no guarding.     Hernia: No hernia is present.  Musculoskeletal:        General: Normal range of motion.  Cervical back: Neck supple.     Right lower leg: No edema.     Left lower leg: No edema.  Lymphadenopathy:     Cervical: No cervical adenopathy.  Skin:    General: Skin is warm and dry.  Neurological:     General: No focal deficit present.     Mental Status: He is alert. Mental status is at baseline.  Psychiatric:        Mood and Affect: Mood normal.        Behavior: Behavior normal.     Lab Results  Component Value Date   WBC 7.7 08/23/2022   HGB 14.2 08/23/2022   HCT 42.0 08/23/2022   PLT 260.0 08/23/2022   GLUCOSE 88 08/23/2022   CHOL 156 08/23/2022   TRIG 62.0 08/23/2022   HDL 73.90 08/23/2022   LDLCALC 70 08/23/2022   ALT 17 08/23/2022   AST 25 08/23/2022   NA 137 08/23/2022   K  4.7 08/23/2022   CL 100 08/23/2022   CREATININE 1.24 08/23/2022   BUN 20 08/23/2022   CO2 26 08/23/2022   TSH 2.22 08/23/2022   PSA 8.44 12/29/2020   HGBA1C 5.7 01/20/2021    MR PROSTATE W WO CONTRAST  Result Date: 02/25/2022 CLINICAL DATA:  Prostate cancer C61. Biopsy 08/06/2021 revealed Gleason 3+3=6 prostate adenocarcinoma along the left medial apex and along TURP chips. EXAM: MR PROSTATE WITHOUT AND WITH CONTRAST TECHNIQUE: Multiplanar multisequence MRI images were obtained of the pelvis centered about the prostate. Pre and post contrast images were obtained. CONTRAST:  64m GADAVIST GADOBUTROL 1 MMOL/ML IV SOLN COMPARISON:  None Available. FINDINGS: Prostate: Extensive metal artifact from bilateral hip prostheses causing distortion of the prostate gland, most severely on the diffusion-weighted images which are borderline nondiagnostic as a result. A rectal coil was utilized. Region of interest # 1: PI-RADS category 3 lesion of the left posterior transition zone in the mid gland and apex, with some obscured margins and mild internal heterogeneity as on image 34 series 7. There is only very low-grade restriction of diffusion in this region as on image 12 series 10. Region of interest # 2: PI-RADS category 3 lesion of the left anterior and left posterolateral peripheral zone in the mid gland, with focally reduced T2 signal (image 28 of series 7). Assessment of ADC map activity in this vicinity limited by distortion from the hip prostheses. This measures 0.21 cc (0.9 by 0.5 by 0.7 cm). Large TURP site noted. Encapsulated nodularity in the transition zone compatible with benign prostatic hypertrophy. Volume: 3D volumetric analysis: Prostate volume 46.71 cc (5.6 by 4.3 by 4.6 cm). Transcapsular spread:  Absent Seminal vesicle involvement: Absent Neurovascular bundle involvement: Absent Pelvic adenopathy: Absent Bone metastasis: Absent Other findings: No other significant findings. IMPRESSION: 1. PI-RADS  category 3 lesion of the left transition zone and PI-RADS category 3 lesion of the left peripheral zone. Targeting data sent to [facility] 2. Large prior TURP. 3. Some reduction in sensitivity due to metal artifact from the bilateral hip prostheses. Electronically Signed   By: WVan ClinesM.D.   On: 02/25/2022 07:53    Assessment & Plan:   JRayshodwas seen today for annual exam, hypertension and hyperlipidemia.  Diagnoses and all orders for this visit:  Essential hypertension, benign- His blood pressure is adequately well-controlled. -     Basic metabolic panel; Future -     TSH; Future -     TSH -     Basic metabolic panel -  EKG 12-Lead  Other vitamin B12 deficiency anemia- H/H are normal. -     Vitamin B12; Future -     CBC with Differential/Platelet; Future -     Folate; Future -     Folate -     CBC with Differential/Platelet -     Vitamin B12  Hyperlipidemia LDL goal <70- I have asked him to start a statin for cardiovascular risk reduction. -     Lipid panel; Future -     Hepatic function panel; Future -     TSH; Future -     TSH -     Hepatic function panel -     Lipid panel -     rosuvastatin (CRESTOR) 10 MG tablet; Take 1 tablet (10 mg total) by mouth daily.  Need for 23-polyvalent pneumococcal polysaccharide vaccine -     Pneumococcal polysaccharide vaccine 23-valent greater than or equal to 2yo subcutaneous/IM  Irregularly irregular heart rhythm- EKG is normal.  I have asked him to wear a heart monitor to evaluate for dysrhythmias. -     CARDIAC EVENT MONITOR; Future  Stage 3a chronic kidney disease ([place])- Will avoid nephrotoxic agents.  CAD in native artery -     rosuvastatin (CRESTOR) 10 MG tablet; Take 1 tablet (10 mg total) by mouth daily.  Encounter for general adult medical examination with abnormal findings- Exam completed, labs reviewed, vaccines reviewed and updated, cancer screenings are up-to-date, patient education was given.   I am having  Samuel Fisher start on rosuvastatin. I am also having him maintain his fluticasone, Cyanocobalamin (VITAMIN B-12 IJ), and hydrALAZINE. We will continue to administer cyanocobalamin.  Meds ordered this encounter  Medications   rosuvastatin (CRESTOR) 10 MG tablet    Sig: Take 1 tablet (10 mg total) by mouth daily.    Dispense:  90 tablet    Refill:  1     Follow-up: Return in about 3 months (around 11/23/2022).  Scarlette Calico, MD

## 2022-08-23 NOTE — Patient Instructions (Signed)
Health Maintenance, Male Adopting a healthy lifestyle and getting preventive care are important in promoting health and wellness. Ask your health care provider about: The right schedule for you to have regular tests and exams. Things you can do on your own to prevent diseases and keep yourself healthy. What should I know about diet, weight, and exercise? Eat a healthy diet  Eat a diet that includes plenty of vegetables, fruits, low-fat dairy products, and lean protein. Do not eat a lot of foods that are high in solid fats, added sugars, or sodium. Maintain a healthy weight Body mass index (BMI) is a measurement that can be used to identify possible weight problems. It estimates body fat based on height and weight. Your health care provider can help determine your BMI and help you achieve or maintain a healthy weight. Get regular exercise Get regular exercise. This is one of the most important things you can do for your health. Most adults should: Exercise for at least 150 minutes each week. The exercise should increase your heart rate and make you sweat (moderate-intensity exercise). Do strengthening exercises at least twice a week. This is in addition to the moderate-intensity exercise. Spend less time sitting. Even light physical activity can be beneficial. Watch cholesterol and blood lipids Have your blood tested for lipids and cholesterol at 75 years of age, then have this test every 5 years. You may need to have your cholesterol levels checked more often if: Your lipid or cholesterol levels are high. You are older than 75 years of age. You are at high risk for heart disease. What should I know about cancer screening? Many types of cancers can be detected early and may often be prevented. Depending on your health history and family history, you may need to have cancer screening at various ages. This may include screening for: Colorectal cancer. Prostate cancer. Skin cancer. Lung  cancer. What should I know about heart disease, diabetes, and high blood pressure? Blood pressure and heart disease High blood pressure causes heart disease and increases the risk of stroke. This is more likely to develop in people who have high blood pressure readings or are overweight. Talk with your health care provider about your target blood pressure readings. Have your blood pressure checked: Every 3-5 years if you are 18-39 years of age. Every year if you are 40 years old or older. If you are between the ages of 65 and 75 and are a current or former smoker, ask your health care provider if you should have a one-time screening for abdominal aortic aneurysm (AAA). Diabetes Have regular diabetes screenings. This checks your fasting blood sugar level. Have the screening done: Once every three years after age 45 if you are at a normal weight and have a low risk for diabetes. More often and at a younger age if you are overweight or have a high risk for diabetes. What should I know about preventing infection? Hepatitis B If you have a higher risk for hepatitis B, you should be screened for this virus. Talk with your health care provider to find out if you are at risk for hepatitis B infection. Hepatitis C Blood testing is recommended for: Everyone born from 1945 through 1965. Anyone with known risk factors for hepatitis C. Sexually transmitted infections (STIs) You should be screened each year for STIs, including gonorrhea and chlamydia, if: You are sexually active and are younger than 75 years of age. You are older than 75 years of age and your   health care provider tells you that you are at risk for this type of infection. Your sexual activity has changed since you were last screened, and you are at increased risk for chlamydia or gonorrhea. Ask your health care provider if you are at risk. Ask your health care provider about whether you are at high risk for HIV. Your health care provider  may recommend a prescription medicine to help prevent HIV infection. If you choose to take medicine to prevent HIV, you should first get tested for HIV. You should then be tested every 3 months for as long as you are taking the medicine. Follow these instructions at home: Alcohol use Do not drink alcohol if your health care provider tells you not to drink. If you drink alcohol: Limit how much you have to 0-2 drinks a day. Know how much alcohol is in your drink. In the [place] one drink equals one 12 oz bottle of beer (355 mL), one 5 oz glass of wine (148 mL), or one 1 oz glass of hard liquor (44 mL). Lifestyle Do not use any products that contain nicotine or tobacco. These products include cigarettes, chewing tobacco, and vaping devices, such as e-cigarettes. If you need help quitting, ask your health care provider. Do not use street drugs. Do not share needles. Ask your health care provider for help if you need support or information about quitting drugs. General instructions Schedule regular health, dental, and eye exams. Stay current with your vaccines. Tell your health care provider if: You often feel depressed. You have ever been abused or do not feel safe at home. Summary Adopting a healthy lifestyle and getting preventive care are important in promoting health and wellness. Follow your health care provider's instructions about healthy diet, exercising, and getting tested or screened for diseases. Follow your health care provider's instructions on monitoring your cholesterol and blood pressure. This information is not intended to replace advice given to you by your health care provider. Make sure you discuss any questions you have with your health care provider. Document Revised: 10/26/2020 Document Reviewed: 10/26/2020 Elsevier Patient Education  2023 [employer]  

## 2022-08-24 DIAGNOSIS — N1831 Chronic kidney disease, stage 3a: Secondary | ICD-10-CM | POA: Insufficient documentation

## 2022-08-24 DIAGNOSIS — Z23 Encounter for immunization: Secondary | ICD-10-CM | POA: Insufficient documentation

## 2022-08-24 MED ORDER — ROSUVASTATIN CALCIUM 10 MG PO TABS: 10.0000 mg | ORAL_TABLET | Freq: Every day | ORAL | 1 refills | Status: DC

## 2022-08-29 DIAGNOSIS — G510 Bell's palsy: Secondary | ICD-10-CM | POA: Diagnosis not present

## 2022-08-29 DIAGNOSIS — G9389 Other specified disorders of brain: Secondary | ICD-10-CM | POA: Diagnosis not present

## 2022-08-29 DIAGNOSIS — R2689 Other abnormalities of gait and mobility: Secondary | ICD-10-CM | POA: Diagnosis not present

## 2022-08-29 DIAGNOSIS — Z9889 Other specified postprocedural states: Secondary | ICD-10-CM | POA: Diagnosis not present

## 2022-08-29 DIAGNOSIS — D333 Benign neoplasm of cranial nerves: Secondary | ICD-10-CM | POA: Diagnosis not present

## 2022-08-29 DIAGNOSIS — Z86018 Personal history of other benign neoplasm: Secondary | ICD-10-CM | POA: Diagnosis not present

## 2022-08-30 ENCOUNTER — Ambulatory Visit: Payer: Medicare Other | Attending: Internal Medicine

## 2022-08-30 DIAGNOSIS — I499 Cardiac arrhythmia, unspecified: Secondary | ICD-10-CM | POA: Diagnosis not present

## 2022-08-30 DIAGNOSIS — R002 Palpitations: Secondary | ICD-10-CM

## 2022-09-01 ENCOUNTER — Ambulatory Visit (INDEPENDENT_AMBULATORY_CARE_PROVIDER_SITE_OTHER): Payer: Medicare Other | Admitting: Radiology

## 2022-09-01 DIAGNOSIS — E538 Deficiency of other specified B group vitamins: Secondary | ICD-10-CM

## 2022-09-01 MED ORDER — CYANOCOBALAMIN 1000 MCG/ML IJ SOLN: 1000.0000 ug | Freq: Once | INTRAMUSCULAR | Status: DC

## 2022-09-01 NOTE — Progress Notes (Signed)
Gave pt b12 and tolerated it well.

## 2022-09-01 NOTE — Addendum Note (Signed)
Addended by: Vertell Novak on: 09/01/2022 10:06 AM   Modules accepted: Orders

## 2022-09-05 DIAGNOSIS — C61 Malignant neoplasm of prostate: Secondary | ICD-10-CM | POA: Diagnosis not present

## 2022-09-12 DIAGNOSIS — R338 Other retention of urine: Secondary | ICD-10-CM | POA: Diagnosis not present

## 2022-09-12 DIAGNOSIS — C61 Malignant neoplasm of prostate: Secondary | ICD-10-CM | POA: Diagnosis not present

## 2022-10-19 ENCOUNTER — Telehealth: Payer: Self-pay | Admitting: Internal Medicine

## 2022-10-19 NOTE — Telephone Encounter (Signed)
Pt came by and said Dr. Yetta Barre informed pt that he needs to schedule with cardiology. The heart monitor he previously had was provided & interpreted by [facility]. Provided pt with their address and phone number for him to contact and schedule an appointment.

## 2022-11-07 DIAGNOSIS — K080 Exfoliation of teeth due to systemic causes: Secondary | ICD-10-CM | POA: Diagnosis not present

## 2022-11-15 ENCOUNTER — Encounter: Payer: Self-pay | Admitting: Internal Medicine

## 2022-11-21 DIAGNOSIS — K080 Exfoliation of teeth due to systemic causes: Secondary | ICD-10-CM | POA: Diagnosis not present

## 2022-11-23 ENCOUNTER — Ambulatory Visit (INDEPENDENT_AMBULATORY_CARE_PROVIDER_SITE_OTHER): Payer: Medicare Other | Admitting: Internal Medicine

## 2022-11-23 ENCOUNTER — Encounter: Payer: Self-pay | Admitting: Internal Medicine

## 2022-11-23 VITALS — BP 126/72 | HR 83 | Temp 97.9°F | Ht 68.0 in | Wt 168.0 lb

## 2022-11-23 DIAGNOSIS — L233 Allergic contact dermatitis due to drugs in contact with skin: Secondary | ICD-10-CM

## 2022-11-23 DIAGNOSIS — L239 Allergic contact dermatitis, unspecified cause: Secondary | ICD-10-CM | POA: Insufficient documentation

## 2022-11-23 DIAGNOSIS — I4729 Other ventricular tachycardia: Secondary | ICD-10-CM

## 2022-11-23 MED ORDER — METHYLPREDNISOLONE ACETATE 80 MG/ML IJ SUSP
80.0000 mg | Freq: Once | INTRAMUSCULAR | Status: AC
  Administered 2022-11-23: 80 mg via INTRAMUSCULAR

## 2022-11-23 MED ORDER — DOXEPIN HCL 10 MG PO CAPS: 10.0000 mg | ORAL_CAPSULE | Freq: Every day | ORAL | 0 refills | Status: DC

## 2022-11-23 MED ORDER — FLUOCINONIDE EMULSIFIED BASE 0.05 % EX CREA: 1.0000 | TOPICAL_CREAM | Freq: Two times a day (BID) | CUTANEOUS | 1 refills | Status: DC

## 2022-11-23 NOTE — Progress Notes (Signed)
Subjective:  Patient ID: Samuel Fisher, male    DOB: 1945/06/29  Age: 77 y.o. MRN: [id number]  CC: Rash   HPI RANNON ECONOMOS presents for f/up ---  He complains of a 58-month history of itchy rash.  A physician friend of his prescribed oral steroids and it got better but now it has returned.  He has been treating it with topical applications of Sarna and Goldbond cream.  Outpatient Medications Prior to Visit  Medication Sig Dispense Refill   Cyanocobalamin (VITAMIN B-12 IJ) Inject 1,000 mcg as directed every 30 (thirty) days.     fluticasone (FLONASE) 50 MCG/ACT nasal spray Place 1-2 sprays into both nostrils daily as needed for allergies.  2   hydrALAZINE (APRESOLINE) 50 MG tablet Take 1 tablet (50 mg total) by mouth 3 (three) times daily. 270 tablet 0   rosuvastatin (CRESTOR) 10 MG tablet Take 1 tablet (10 mg total) by mouth daily. 90 tablet 1   Facility-Administered Medications Prior to Visit  Medication Dose Route Frequency Provider Last Rate Last Admin   cyanocobalamin (VITAMIN B12) injection 1,000 mcg  1,000 mcg Intramuscular Q30 days Etta Grandchild, MD   1,000 mcg at 09/01/22 1630    ROS Review of Systems  Constitutional: Negative.  Negative for diaphoresis and fatigue.  HENT: Negative.    Eyes: Negative.   Respiratory:  Negative for cough, chest tightness, shortness of breath and wheezing.   Cardiovascular:  Negative for chest pain, palpitations and leg swelling.  Gastrointestinal:  Negative for abdominal pain, diarrhea, nausea and vomiting.  Endocrine: Negative.   Genitourinary: Negative.  Negative for difficulty urinating.  Musculoskeletal: Negative.   Skin:  Positive for color change and rash.  Neurological:  Negative for dizziness, weakness and light-headedness.  Hematological:  Negative for adenopathy. Does not bruise/bleed easily.  Psychiatric/Behavioral: Negative.      Objective:  BP 126/72 (BP Location: Right Arm, Patient Position:  Sitting, Cuff Size: Large)   Pulse 83   Temp 97.9 F (36.6 C) (Oral)   Ht 5\' 8"  (1.727 m)   Wt 168 lb (76.2 kg)   SpO2 95%   BMI 25.54 kg/m   BP Readings from Last 3 Encounters:  11/23/22 126/72  08/23/22 124/82  10/18/21 (!) 148/72    Wt Readings from Last 3 Encounters:  11/23/22 168 lb (76.2 kg)  08/23/22 171 lb (77.6 kg)  10/18/21 174 lb (78.9 kg)    Physical Exam Vitals reviewed.  Constitutional:      Appearance: Normal appearance.  HENT:     Mouth/Throat:     Mouth: Mucous membranes are moist.  Eyes:     General: No scleral icterus.    Conjunctiva/sclera: Conjunctivae normal.  Cardiovascular:     Rate and Rhythm: Normal rate and regular rhythm.     Heart sounds: No murmur heard. Pulmonary:     Effort: Pulmonary effort is normal.     Breath sounds: No stridor. No wheezing, rhonchi or rales.  Abdominal:     General: Abdomen is flat.     Palpations: There is no mass.     Tenderness: There is no abdominal tenderness. There is no guarding.     Hernia: No hernia is present.  Musculoskeletal:        General: Normal range of motion.     Cervical back: Neck supple.     Right lower leg: No edema.     Left lower leg: No edema.  Lymphadenopathy:  Cervical: No cervical adenopathy.  Skin:    Findings: Lesion and rash present.     Comments: There is no involvement of the umbilicus or the webspaces.  Neurological:     General: No focal deficit present.     Mental Status: He is alert. Mental status is at baseline.  Psychiatric:        Mood and Affect: Mood normal.        Behavior: Behavior normal.     Lab Results  Component Value Date   WBC 7.7 08/23/2022   HGB 14.2 08/23/2022   HCT 42.0 08/23/2022   PLT 260.0 08/23/2022   GLUCOSE 88 08/23/2022   CHOL 156 08/23/2022   TRIG 62.0 08/23/2022   HDL 73.90 08/23/2022   LDLCALC 70 08/23/2022   ALT 17 08/23/2022   AST 25 08/23/2022   NA 137 08/23/2022   K 4.7 08/23/2022   CL 100 08/23/2022   CREATININE  1.24 08/23/2022   BUN 20 08/23/2022   CO2 26 08/23/2022   TSH 2.22 08/23/2022   PSA 8.44 12/29/2020   HGBA1C 5.7 01/20/2021    MR PROSTATE W WO CONTRAST  Result Date: 02/25/2022 CLINICAL DATA:  Prostate cancer C61. Biopsy 08/06/2021 revealed Gleason 3+3=6 prostate adenocarcinoma along the left medial apex and along TURP chips. EXAM: MR PROSTATE WITHOUT AND WITH CONTRAST TECHNIQUE: Multiplanar multisequence MRI images were obtained of the pelvis centered about the prostate. Pre and post contrast images were obtained. CONTRAST:  8mL GADAVIST GADOBUTROL 1 MMOL/ML IV SOLN COMPARISON:  None Available. FINDINGS: Prostate: Extensive metal artifact from bilateral hip prostheses causing distortion of the prostate gland, most severely on the diffusion-weighted images which are borderline nondiagnostic as a result. A rectal coil was utilized. Region of interest # 1: PI-RADS category 3 lesion of the left posterior transition zone in the mid gland and apex, with some obscured margins and mild internal heterogeneity as on image 34 series 7. There is only very low-grade restriction of diffusion in this region as on image 12 series 10. Region of interest # 2: PI-RADS category 3 lesion of the left anterior and left posterolateral peripheral zone in the mid gland, with focally reduced T2 signal (image 28 of series 7). Assessment of ADC map activity in this vicinity limited by distortion from the hip prostheses. This measures 0.21 cc (0.9 by 0.5 by 0.7 cm). Large TURP site noted. Encapsulated nodularity in the transition zone compatible with benign prostatic hypertrophy. Volume: 3D volumetric analysis: Prostate volume 46.71 cc (5.6 by 4.3 by 4.6 cm). Transcapsular spread:  Absent Seminal vesicle involvement: Absent Neurovascular bundle involvement: Absent Pelvic adenopathy: Absent Bone metastasis: Absent Other findings: No other significant findings. IMPRESSION: 1. PI-RADS category 3 lesion of the left transition zone and  PI-RADS category 3 lesion of the left peripheral zone. Targeting data sent to [facility]. 2. Large prior TURP. 3. Some reduction in sensitivity due to metal artifact from the bilateral hip prostheses. Electronically Signed   By: Gaylyn Rong M.D.   On: 02/25/2022 07:53    Assessment & Plan:   Eczema, allergic -     Fluocinonide Emulsified Base; Apply 1 Application topically 2 (two) times daily.  Dispense: 120 g; Refill: 1 -     Doxepin HCl; Take 1 capsule (10 mg total) by mouth at bedtime.  Dispense: 30 capsule; Refill: 0 -     methylPREDNISolone Acetate  Allergic contact dermatitis due to drugs in contact with skin- I have asked him to stop using Goldbond as  this may be triggering a contact dermatitis.  Will treat with an oral antihistamine, topical steroid, and another course of systemic steroids. -     Fluocinonide Emulsified Base; Apply 1 Application topically 2 (two) times daily.  Dispense: 120 g; Refill: 1 -     Doxepin HCl; Take 1 capsule (10 mg total) by mouth at bedtime.  Dispense: 30 capsule; Refill: 0 -     methylPREDNISolone Acetate  NSVT (nonsustained ventricular tachycardia) (HCC) -     Ambulatory referral to Cardiology     Follow-up: Return in about 6 weeks (around 01/04/2023).  Sanda Linger, MD

## 2022-11-23 NOTE — Patient Instructions (Signed)
Atopic Dermatitis ?Atopic dermatitis is a skin disorder that causes inflammation of the skin. It is marked by a red rash and itchy, dry, scaly skin. It is the most common type of eczema. Eczema is a group of skin conditions that cause the skin to become rough and swollen. This condition is generally worse during the cooler winter months and often improves during the warm summer months. ?Atopic dermatitis usually starts showing signs in infancy and can last through adulthood. This condition cannot be passed from one person to another (is not contagious). Atopic dermatitis may not always be present, but when it is, it is called a flare-up. ?What are the causes? ?The exact cause of this condition is not known. Flare-ups may be triggered by: ?Coming in contact with something that you are sensitive or allergic to (allergen). ?Stress. ?Certain foods. ?Extremely hot or cold weather. ?Harsh chemicals and soaps. ?Dry air. ?Chlorine. ?What increases the risk? ?This condition is more likely to develop in people who have a personal or family history of: ?Eczema. ?Allergies. ?Asthma. ?Hay fever. ?What are the signs or symptoms? ?Symptoms of this condition include: ?Dry, scaly skin. ?Red, itchy rash. ?Itchiness, which can be severe. This may occur before the skin rash. This can make sleeping difficult. ?Skin thickening and cracking that can occur over time. ?How is this diagnosed? ?This condition is diagnosed based on: ?Your symptoms. ?Your medical history. ?A physical exam. ?How is this treated? ?There is no cure for this condition, but symptoms can usually be controlled. Treatment focuses on: ?Controlling the itchiness and scratching. You may be given medicines, such as antihistamines or steroid creams. ?Limiting exposure to allergens. ?Recognizing situations that cause stress and developing a plan to manage stress. ?If your atopic dermatitis does not get better with medicines, or if it is all over your body (widespread), a  treatment using a specific type of light (phototherapy) may be used. ?Follow these instructions at home: ?Skin care ? ?Keep your skin well moisturized. Doing this seals in moisture and helps to prevent dryness. ?Use unscented lotions that have petroleum in them. ?Avoid lotions that contain alcohol or water. They can dry the skin. ?Keep baths or showers short (less than 5 minutes) in warm water. Do not use hot water. ?Use mild, unscented cleansers for bathing. Avoid soap and bubble bath. ?Apply a moisturizer to your skin right after a bath or shower. ?Do not apply anything to your skin without checking with your health care provider. ?General instructions ?Take or apply over-the-counter and prescription medicines only as told by your health care provider. ?Dress in clothes made of cotton or cotton blends. Dress lightly because heat increases itchiness. ?When washing your clothes, rinse your clothes twice so all of the soap is removed. ?Avoid any triggers that can cause a flare-up. ?Keep your fingernails cut short. ?Avoid scratching. Scratching makes the rash and itchiness worse. A break in the skin from scratching could result in a skin infection (impetigo). ?Do not be around people who have cold sores or fever blisters. If you get the infection, it may cause your atopic dermatitis to worsen. ?Keep all follow-up visits. This is important. ?Contact a health care provider if: ?Your itchiness interferes with sleep. ?Your rash gets worse or is not better within one week of starting treatment. ?You have a fever. ?You have a rash flare-up after having contact with someone who has cold sores or fever blisters. ?Get help right away if: ?You develop pus or soft yellow scabs in the rash   area. ?Summary ?Atopic dermatitis causes a red rash and itchy, dry, scaly skin. ?Treatment focuses on controlling the itchiness and scratching, limiting exposure to things that you are sensitive or allergic to (allergens), recognizing  situations that cause stress, and developing a plan to manage stress. ?Keep your skin well moisturized. ?Keep baths or showers shorter than 5 minutes and use warm water. Do not use hot water. ?This information is not intended to replace advice given to you by your health care provider. Make sure you discuss any questions you have with your health care provider. ?Document Revised: 03/16/2020 Document Reviewed: 03/16/2020 ?Elsevier Patient Education ? 2023 Elsevier Inc. ? ?

## 2022-11-30 ENCOUNTER — Ambulatory Visit (INDEPENDENT_AMBULATORY_CARE_PROVIDER_SITE_OTHER): Payer: Medicare Other | Admitting: *Deleted

## 2022-11-30 DIAGNOSIS — E538 Deficiency of other specified B group vitamins: Secondary | ICD-10-CM | POA: Diagnosis not present

## 2022-11-30 MED ORDER — CYANOCOBALAMIN 1000 MCG/ML IJ SOLN
1000.0000 ug | Freq: Once | INTRAMUSCULAR | Status: AC
  Administered 2022-11-30: 1000 ug via INTRAMUSCULAR

## 2022-11-30 NOTE — Progress Notes (Signed)
Pls cosign for B12 inj../lmb  

## 2022-12-16 ENCOUNTER — Other Ambulatory Visit: Payer: Self-pay | Admitting: Internal Medicine

## 2022-12-16 DIAGNOSIS — I10 Essential (primary) hypertension: Secondary | ICD-10-CM

## 2022-12-23 NOTE — Progress Notes (Unsigned)
Cardiology Office Note:   Date:  12/23/2022  NAME:  Samuel Fisher    MRN: [id number] DOB:  11/02/1945   PCP:  Etta Grandchild, MD  Cardiologist:  None  Electrophysiologist:  None   Referring MD: Etta Grandchild, MD   No chief complaint on file.   History of Present Illness:   Samuel Fisher is a 77 y.o. male with a hx of HTN, HLD who is being seen today for the evaluation of PACs at the request of Etta Grandchild, MD.  ***  TSH 2.22   Problem List HTN HLD -T chol 156, HDL 73, LDL 70, TG 62 PACs  -1% burden   Past Medical History: Past Medical History:  Diagnosis Date   Allergy    Arthritis    BPH (benign prostatic hyperplasia) 2010   Brain tumor (HCC)    Hearing problem    Hyperlipidemia    Hypertension    Inner ear dysfunction    Urinary retention     Past Surgical History: Past Surgical History:  Procedure Laterality Date   BRAIN TUMOR EXCISION     NERVE GRAFT     TONSILLECTOMY  1951   TOTAL HIP ARTHROPLASTY     right-2002 and left-2004, 2010   TRANSURETHRAL RESECTION OF PROSTATE N/A 08/05/2021   Procedure: TRANSURETHRAL RESECTION OF THE PROSTATE (TURP) AND ULTRASOUND GUIDED TRANSRECTAL PROSTATE BIOPSY;  Surgeon: Crist Fat, MD;  Location: [facility];  Service: Urology;  Laterality: N/A;   WISDOM TOOTH EXTRACTION     Bottom two    Current Medications: No outpatient medications have been marked as taking for the 12/29/22 encounter (Appointment) with O'Neal, Ronnald Ramp, MD.   Current Facility-Administered Medications for the 12/29/22 encounter (Appointment) with Sande Rives, MD  Medication   cyanocobalamin (VITAMIN B12) injection 1,000 mcg     Allergies:    Bee venom   Social History: Social History   Socioeconomic History   Marital status: Married    Spouse name: Not on file   Number of children: 2   Years of education: Not on file   Highest education level: Not on file  Occupational History   Occupation: retired   Tobacco Use   Smoking status: Former    Types: Cigarettes    Quit date: 06/28/1976    Years since quitting: 46.5   Smokeless tobacco: Never  Vaping Use   Vaping Use: Never used  Substance and Sexual Activity   Alcohol use: Yes    Alcohol/week: 3.0 standard drinks of alcohol    Types: 3 Cans of beer per week    Comment: 2-3 beers per day and occasional drink on the weekends   Drug use: No   Sexual activity: Yes  Other Topics Concern   Not on file  Social History Narrative   Not on file   Social Determinants of Health   Financial Resource Strain: Low Risk  (01/14/2022)   Overall Financial Resource Strain (CARDIA)    Difficulty of Paying Living Expenses: Not hard at all  Food Insecurity: No Food Insecurity (01/14/2022)   Hunger Vital Sign    Worried About Running Out of Food in the Last Year: Never true    Ran Out of Food in the Last Year: Never true  Transportation Needs: No Transportation Needs (03/16/2022)   PRAPARE - Administrator, Civil Service (Medical): No    Lack of Transportation (Non-Medical): No  Physical Activity: Sufficiently Active (01/14/2022)   Exercise  Vital Sign    Days of Exercise per Week: 5 days    Minutes of Exercise per Session: 60 min  Stress: No Stress Concern Present (03/16/2022)   [employer] of Occupational Health - Occupational Stress Questionnaire    Feeling of Stress : Only a little  Social Connections: Socially Integrated (01/14/2022)   Social Connection and Isolation Panel [NHANES]    Frequency of Communication with Friends and Family: More than three times a week    Frequency of Social Gatherings with Friends and Family: More than three times a week    Attends Religious Services: More than 4 times per year    Active Member of [employer] or Organizations: Yes    Attends Engineer, structural: More than 4 times per year    Marital Status: Married     Family History: The patient's family history includes Arthritis in an  other family member; Early death in an other family member; Heart attack in his brother and maternal grandfather; Heart disease in an other family member; Hypertension in his brother, father, and another family member; Mental illness in an other family member; Rheum arthritis in his mother; Stroke in his brother and another family member; Throat cancer in his father; Uterine cancer in his mother. There is no history of Diabetes or Hyperlipidemia.  ROS:   All other ROS reviewed and negative. Pertinent positives noted in the HPI.     EKGs/Labs/Other Studies Reviewed:   The following studies were personally reviewed by me today:  EKG:  EKG is *** ordered today.        Recent Labs: 08/23/2022: ALT 17; BUN 20; Creatinine, Ser 1.24; Hemoglobin 14.2; Platelets 260.0; Potassium 4.7; Sodium 137; TSH 2.22   Recent Lipid Panel    Component Value Date/Time   CHOL 156 08/23/2022 1519   TRIG 62.0 08/23/2022 1519   HDL 73.90 08/23/2022 1519   CHOLHDL 2 08/23/2022 1519   VLDL 12.4 08/23/2022 1519   LDLCALC 70 08/23/2022 1519    Physical Exam:   VS:  There were no vitals taken for this visit.   Wt Readings from Last 3 Encounters:  11/23/22 168 lb (76.2 kg)  08/23/22 171 lb (77.6 kg)  10/18/21 174 lb (78.9 kg)    General: Well nourished, well developed, in no acute distress Head: Atraumatic, normal size  Eyes: PEERLA, EOMI  Neck: Supple, no JVD Endocrine: No thryomegaly Cardiac: Normal S1, S2; RRR; no murmurs, rubs, or gallops Lungs: Clear to auscultation bilaterally, no wheezing, rhonchi or rales  Abd: Soft, nontender, no hepatomegaly  Ext: No edema, pulses 2+ Musculoskeletal: No deformities, BUE and BLE strength normal and equal Skin: Warm and dry, no rashes   Neuro: Alert and oriented to person, place, time, and situation, CNII-XII grossly intact, no focal deficits  Psych: Normal mood and affect   ASSESSMENT:   Samuel Fisher is a 77 y.o. male who presents for the following: No  diagnosis found.  PLAN:   There are no diagnoses linked to this encounter.  {Are you ordering a CV Procedure (e.g. stress test, cath, DCCV, TEE, etc)?   Press F2        :[id number]}  Disposition: No follow-ups on file.  Medication Adjustments/Labs and Tests Ordered: Current medicines are reviewed at length with the patient today.  Concerns regarding medicines are outlined above.  No orders of the defined types were placed in this encounter.  No orders of the defined types were placed in this encounter.  There  are no Patient Instructions on file for this visit.   Time Spent with Patient: I have spent a total of *** minutes with patient reviewing hospital notes, telemetry, EKGs, labs and examining the patient as well as establishing an assessment and plan that was discussed with the patient.  > 50% of time was spent in direct patient care.  Signed, Lenna Gilford. Flora Lipps, MD, [facility]  [facility]  [address], [address] [place], [place] [address] [phone]  12/23/2022 4:59 PM

## 2022-12-29 ENCOUNTER — Encounter: Payer: Self-pay | Admitting: Cardiovascular Disease

## 2022-12-29 ENCOUNTER — Ambulatory Visit: Payer: Medicare Other | Attending: Cardiovascular Disease | Admitting: Cardiovascular Disease

## 2022-12-29 VITALS — BP 154/78 | HR 92 | Ht 68.0 in | Wt 171.8 lb

## 2022-12-29 DIAGNOSIS — I150 Renovascular hypertension: Secondary | ICD-10-CM

## 2022-12-29 DIAGNOSIS — E782 Mixed hyperlipidemia: Secondary | ICD-10-CM

## 2022-12-29 DIAGNOSIS — I491 Atrial premature depolarization: Secondary | ICD-10-CM

## 2022-12-29 DIAGNOSIS — I471 Supraventricular tachycardia, unspecified: Secondary | ICD-10-CM | POA: Diagnosis not present

## 2022-12-29 NOTE — Patient Instructions (Signed)
Medication Instructions:  Your physician recommends that you continue on your current medications as directed. Please refer to the Current Medication list given to you today.  *If you need a refill on your cardiac medications before your next appointment, please call your pharmacy*   Testing/Procedures: Your physician has requested that you have an echocardiogram. Echocardiography is a painless test that uses sound waves to create images of your heart. It provides your doctor with information about the size and shape of your heart and how well your heart's chambers and valves are working. This procedure takes approximately one hour. There are no restrictions for this procedure. Please do NOT wear cologne, perfume, aftershave, or lotions (deodorant is allowed). Please arrive 15 minutes prior to your appointment time.  Dr. Flora Lipps has ordered a CT coronary calcium score.   Test locations:  [facility]  [place]  [facility]  This is $99 out of pocket.   Coronary CalciumScan A coronary calcium scan is an imaging test used to look for deposits of calcium and other fatty materials (plaques) in the inner lining of the blood vessels of the heart (coronary arteries). These deposits of calcium and plaques can partly clog and narrow the coronary arteries without producing any symptoms or warning signs. This puts a person at risk for a heart attack. This test can detect these deposits before symptoms develop. Tell a health care provider about: Any allergies you have. All medicines you are taking, including vitamins, herbs, eye drops, creams, and over-the-counter medicines. Any problems you or family members have had with anesthetic medicines. Any blood disorders you have. Any surgeries you have had. Any medical conditions you have. Whether you are pregnant or may be pregnant. What are the risks? Generally, this is a  safe procedure. However, problems may occur, including: Harm to a pregnant woman and her unborn baby. This test involves the use of radiation. Radiation exposure can be dangerous to a pregnant woman and her unborn baby. If you are pregnant, you generally should not have this procedure done. Slight increase in the risk of cancer. This is because of the radiation involved in the test. What happens before the procedure? No preparation is needed for this procedure. What happens during the procedure? You will undress and remove any jewelry around your neck or chest. You will put on a hospital gown. Sticky electrodes will be placed on your chest. The electrodes will be connected to an electrocardiogram (ECG) machine to record a tracing of the electrical activity of your heart. A CT scanner will take pictures of your heart. During this time, you will be asked to lie still and hold your breath for 2-3 seconds while a picture of your heart is being taken. The procedure may vary among health care providers and hospitals. What happens after the procedure? You can get dressed. You can return to your normal activities. It is up to you to get the results of your test. Ask your health care provider, or the department that is doing the test, when your results will be ready. Summary A coronary calcium scan is an imaging test used to look for deposits of calcium and other fatty materials (plaques) in the inner lining of the blood vessels of the heart (coronary arteries). Generally, this is a safe procedure. Tell your health care provider if you are pregnant or may be pregnant. No preparation is needed for this procedure. A CT scanner will take pictures  of your heart. You can return to your normal activities after the scan is done. This information is not intended to replace advice given to you by your health care provider. Make sure you discuss any questions you have with your health care provider. Document  Released: 12/03/2007 Document Revised: 04/25/2016 Document Reviewed: 04/25/2016 Elsevier Interactive Patient Education  2017 [employer].    Follow-Up: At [facility], you and your health needs are our priority.  As part of our continuing mission to provide you with exceptional heart care, we have created designated Provider Care Teams.  These Care Teams include your primary Cardiologist (physician) and Advanced Practice Providers (APPs -  Physician Assistants and Nurse Practitioners) who all work together to provide you with the care you need, when you need it.  We recommend signing up for the patient portal called "MyChart".  Sign up information is provided on this After Visit Summary.  MyChart is used to connect with patients for Virtual Visits (Telemedicine).  Patients are able to view lab/test results, encounter notes, upcoming appointments, etc.  Non-urgent messages can be sent to your provider as well.   To learn more about what you can do with MyChart, go to [email or web address].    Your next appointment:   We will follow up as needed pending your imaging results   Provider:   Dr. Flora Lipps

## 2022-12-30 ENCOUNTER — Other Ambulatory Visit: Payer: Self-pay | Admitting: Internal Medicine

## 2022-12-30 ENCOUNTER — Telehealth: Payer: Self-pay | Admitting: Internal Medicine

## 2022-12-30 DIAGNOSIS — L239 Allergic contact dermatitis, unspecified cause: Secondary | ICD-10-CM

## 2022-12-30 DIAGNOSIS — L233 Allergic contact dermatitis due to drugs in contact with skin: Secondary | ICD-10-CM

## 2022-12-30 MED ORDER — FLUOCINONIDE EMULSIFIED BASE 0.05 % EX CREA
1.0000 | TOPICAL_CREAM | Freq: Two times a day (BID) | CUTANEOUS | 1 refills | Status: AC
Start: 2022-12-30 — End: ?

## 2022-12-30 NOTE — Telephone Encounter (Signed)
Patient came in and asked if a refill on his fluocinonide-emollient (LIDEX-E) 0.05 % cream could be sent in for him.

## 2023-01-18 ENCOUNTER — Encounter (INDEPENDENT_AMBULATORY_CARE_PROVIDER_SITE_OTHER): Payer: Self-pay

## 2023-02-07 ENCOUNTER — Ambulatory Visit (INDEPENDENT_AMBULATORY_CARE_PROVIDER_SITE_OTHER): Payer: Medicare Other | Admitting: Internal Medicine

## 2023-02-07 ENCOUNTER — Encounter: Payer: Self-pay | Admitting: Internal Medicine

## 2023-02-07 VITALS — BP 146/88 | HR 80 | Temp 98.7°F | Resp 16 | Ht 68.0 in | Wt 169.0 lb

## 2023-02-07 DIAGNOSIS — Z1211 Encounter for screening for malignant neoplasm of colon: Secondary | ICD-10-CM

## 2023-02-07 DIAGNOSIS — T07XXXA Unspecified multiple injuries, initial encounter: Secondary | ICD-10-CM | POA: Diagnosis not present

## 2023-02-07 DIAGNOSIS — Z1159 Encounter for screening for other viral diseases: Secondary | ICD-10-CM

## 2023-02-07 DIAGNOSIS — D518 Other vitamin B12 deficiency anemias: Secondary | ICD-10-CM

## 2023-02-07 DIAGNOSIS — N1831 Chronic kidney disease, stage 3a: Secondary | ICD-10-CM | POA: Diagnosis not present

## 2023-02-07 LAB — CBC WITH DIFFERENTIAL/PLATELET
Basophils Absolute: 0 10*3/uL (ref 0.0–0.1)
Basophils Relative: 0.4 % (ref 0.0–3.0)
Eosinophils Absolute: 0.2 10*3/uL (ref 0.0–0.7)
Eosinophils Relative: 3.6 % (ref 0.0–5.0)
HCT: 38.6 % — ABNORMAL LOW (ref 39.0–52.0)
Hemoglobin: 12.9 g/dL — ABNORMAL LOW (ref 13.0–17.0)
Lymphocytes Relative: 18.2 % (ref 12.0–46.0)
Lymphs Abs: 1.1 10*3/uL (ref 0.7–4.0)
MCHC: 33.4 g/dL (ref 30.0–36.0)
MCV: 94.5 fl (ref 78.0–100.0)
Monocytes Absolute: 0.6 10*3/uL (ref 0.1–1.0)
Monocytes Relative: 10.6 % (ref 3.0–12.0)
Neutro Abs: 3.9 10*3/uL (ref 1.4–7.7)
Neutrophils Relative %: 67.2 % (ref 43.0–77.0)
Platelets: 175 10*3/uL (ref 150.0–400.0)
RBC: 4.09 Mil/uL — ABNORMAL LOW (ref 4.22–5.81)
RDW: 13.8 % (ref 11.5–15.5)
WBC: 5.9 10*3/uL (ref 4.0–10.5)

## 2023-02-07 LAB — BASIC METABOLIC PANEL
BUN: 18 mg/dL (ref 6–23)
CO2: 25 mEq/L (ref 19–32)
Calcium: 9.3 mg/dL (ref 8.4–10.5)
Chloride: 104 mEq/L (ref 96–112)
Creatinine, Ser: 1.16 mg/dL (ref 0.40–1.50)
GFR: 61.65 mL/min (ref >60.00)
Glucose, Bld: 117 mg/dL — ABNORMAL HIGH (ref 70–99)
Potassium: 3.6 mEq/L (ref 3.5–5.1)
Sodium: 137 mEq/L (ref 135–145)

## 2023-02-07 NOTE — Progress Notes (Signed)
Subjective:  Patient ID: Samuel Fisher, male    DOB: April 29, 1946  Age: 77 y.o. MRN: [id number]  CC: Rash and Anemia   HPI Samuel Fisher presents for f/up ---  He continues to complain of rash on both lower extremities worse on the right than the left.  It has not responded to a topical steroid.  Outpatient Medications Prior to Visit  Medication Sig Dispense Refill   Cyanocobalamin (VITAMIN B-12 IJ) Inject 1,000 mcg as directed every 30 (thirty) days.     fluocinonide-emollient (LIDEX-E) 0.05 % cream Apply 1 Application topically 2 (two) times daily. 120 g 1   hydrALAZINE (APRESOLINE) 50 MG tablet Take 1 tablet (50 mg total) by mouth 3 (three) times daily. 270 tablet 0   rosuvastatin (CRESTOR) 10 MG tablet Take 1 tablet (10 mg total) by mouth daily. 90 tablet 1   Facility-Administered Medications Prior to Visit  Medication Dose Route Frequency Provider Last Rate Last Admin   cyanocobalamin (VITAMIN B12) injection 1,000 mcg  1,000 mcg Intramuscular Q30 days Etta Grandchild, MD   1,000 mcg at 09/01/22 1630    ROS Review of Systems  Constitutional: Negative.  Negative for diaphoresis, fatigue and unexpected weight change.  HENT: Negative.    Eyes: Negative.   Respiratory: Negative.  Negative for choking, chest tightness, shortness of breath and wheezing.   Cardiovascular:  Negative for chest pain, palpitations and leg swelling.  Gastrointestinal:  Negative for abdominal pain, constipation, diarrhea, nausea and vomiting.  Genitourinary: Negative.   Musculoskeletal: Negative.  Negative for arthralgias and myalgias.  Skin:  Positive for rash.  Neurological: Negative.  Negative for weakness.  Hematological:  Negative for adenopathy. Does not bruise/bleed easily.  Psychiatric/Behavioral: Negative.      Objective:  BP (!) 146/88 (BP Location: Left Arm, Patient Position: Sitting, Cuff Size: Large)   Pulse 80   Temp 98.7 F (37.1 C) (Oral)   Resp 16   Ht 5\' 8"   (1.727 m)   Wt 169 lb (76.7 kg)   SpO2 98%   BMI 25.70 kg/m   BP Readings from Last 3 Encounters:  02/07/23 (!) 146/88  12/29/22 (!) 154/78  11/23/22 126/72    Wt Readings from Last 3 Encounters:  02/07/23 169 lb (76.7 kg)  12/29/22 171 lb 12.8 oz (77.9 kg)  11/23/22 168 lb (76.2 kg)    Physical Exam Vitals reviewed.  Constitutional:      Appearance: Normal appearance.  HENT:     Nose: Nose normal.     Mouth/Throat:     Mouth: Mucous membranes are moist.  Eyes:     General: No scleral icterus.    Conjunctiva/sclera: Conjunctivae normal.  Cardiovascular:     Rate and Rhythm: Normal rate and regular rhythm.     Pulses: Normal pulses.     Heart sounds: No murmur heard. Pulmonary:     Effort: Pulmonary effort is normal.     Breath sounds: No stridor. No wheezing, rhonchi or rales.  Abdominal:     General: Abdomen is flat.     Palpations: There is no mass.     Tenderness: There is no abdominal tenderness. There is no guarding.     Hernia: No hernia is present.  Musculoskeletal:        General: Normal range of motion.     Cervical back: Neck supple.     Right lower leg: No edema.     Left lower leg: No edema.  Lymphadenopathy:     Cervical: No cervical adenopathy.  Skin:    Findings: Lesion present. Rash is not pustular, scaling or vesicular.     Comments: There are scabbed excoriations on both lower extremities.  Neurological:     General: No focal deficit present.     Mental Status: He is alert.  Psychiatric:        Mood and Affect: Mood normal.        Behavior: Behavior normal.     Lab Results  Component Value Date   WBC 5.9 02/07/2023   HGB 12.9 (L) 02/07/2023   HCT 38.6 (L) 02/07/2023   PLT 175.0 02/07/2023   GLUCOSE 117 (H) 02/07/2023   CHOL 156 08/23/2022   TRIG 62.0 08/23/2022   HDL 73.90 08/23/2022   LDLCALC 70 08/23/2022   ALT 17 08/23/2022   AST 25 08/23/2022   NA 137 02/07/2023   K 3.6 02/07/2023   CL 104 02/07/2023   CREATININE 1.16  02/07/2023   BUN 18 02/07/2023   CO2 25 02/07/2023   TSH 2.22 08/23/2022   PSA 8.44 12/29/2020   HGBA1C 5.7 01/20/2021         MR PROSTATE W WO CONTRAST  Result Date: 02/25/2022 CLINICAL DATA:  Prostate cancer C61. Biopsy 08/06/2021 revealed Gleason 3+3=6 prostate adenocarcinoma along the left medial apex and along TURP chips. EXAM: MR PROSTATE WITHOUT AND WITH CONTRAST TECHNIQUE: Multiplanar multisequence MRI images were obtained of the pelvis centered about the prostate. Pre and post contrast images were obtained. CONTRAST:  8mL GADAVIST GADOBUTROL 1 MMOL/ML IV SOLN COMPARISON:  None Available. FINDINGS: Prostate: Extensive metal artifact from bilateral hip prostheses causing distortion of the prostate gland, most severely on the diffusion-weighted images which are borderline nondiagnostic as a result. A rectal coil was utilized. Region of interest # 1: PI-RADS category 3 lesion of the left posterior transition zone in the mid gland and apex, with some obscured margins and mild internal heterogeneity as on image 34 series 7. There is only very low-grade restriction of diffusion in this region as on image 12 series 10. Region of interest # 2: PI-RADS category 3 lesion of the left anterior and left posterolateral peripheral zone in the mid gland, with focally reduced T2 signal (image 28 of series 7). Assessment of ADC map activity in this vicinity limited by distortion from the hip prostheses. This measures 0.21 cc (0.9 by 0.5 by 0.7 cm). Large TURP site noted. Encapsulated nodularity in the transition zone compatible with benign prostatic hypertrophy. Volume: 3D volumetric analysis: Prostate volume 46.71 cc (5.6 by 4.3 by 4.6 cm). Transcapsular spread:  Absent Seminal vesicle involvement: Absent Neurovascular bundle involvement: Absent Pelvic adenopathy: Absent Bone metastasis: Absent Other findings: No other significant findings. IMPRESSION: 1. PI-RADS category 3 lesion of the left transition zone  and PI-RADS category 3 lesion of the left peripheral zone. Targeting data sent to [facility]. 2. Large prior TURP. 3. Some reduction in sensitivity due to metal artifact from the bilateral hip prostheses. Electronically Signed   By: Gaylyn Rong M.D.   On: 02/25/2022 07:53    Assessment & Plan:   Stage 3a chronic kidney disease (HCC)- His renal function is stable. -     Basic metabolic panel; Future -     CBC with Differential/Platelet; Future  Screening for colon cancer -     Cologuard  Multiple excoriations -     Ambulatory referral to Dermatology  Other vitamin B12 deficiency anemia- Will continue parenteral B12 replacement  therapy. -     CBC with Differential/Platelet; Future  Need for hepatitis C screening test -     Hepatitis C antibody; Future     Follow-up: Return in about 4 months (around 06/09/2023).  Sanda Linger, MD

## 2023-02-07 NOTE — Patient Instructions (Signed)
Hypertension, Adult High blood pressure (hypertension) is when the force of blood pumping through the arteries is too strong. The arteries are the blood vessels that carry blood from the heart throughout the body. Hypertension forces the heart to work harder to pump blood and may cause arteries to become narrow or stiff. Untreated or uncontrolled hypertension can lead to a heart attack, heart failure, a stroke, kidney disease, and other problems. A blood pressure reading consists of a higher number over a lower number. Ideally, your blood pressure should be below 120/80. The first ("top") number is called the systolic pressure. It is a measure of the pressure in your arteries as your heart beats. The second ("bottom") number is called the diastolic pressure. It is a measure of the pressure in your arteries as the heart relaxes. What are the causes? The exact cause of this condition is not known. There are some conditions that result in high blood pressure. What increases the risk? Certain factors may make you more likely to develop high blood pressure. Some of these risk factors are under your control, including: Smoking. Not getting enough exercise or physical activity. Being overweight. Having too much fat, sugar, calories, or salt (sodium) in your diet. Drinking too much alcohol. Other risk factors include: Having a personal history of heart disease, diabetes, high cholesterol, or kidney disease. Stress. Having a family history of high blood pressure and high cholesterol. Having obstructive sleep apnea. Age. The risk increases with age. What are the signs or symptoms? High blood pressure may not cause symptoms. Very high blood pressure (hypertensive crisis) may cause: Headache. Fast or irregular heartbeats (palpitations). Shortness of breath. Nosebleed. Nausea and vomiting. Vision changes. Severe chest pain, dizziness, and seizures. How is this diagnosed? This condition is diagnosed by  measuring your blood pressure while you are seated, with your arm resting on a flat surface, your legs uncrossed, and your feet flat on the floor. The cuff of the blood pressure monitor will be placed directly against the skin of your upper arm at the level of your heart. Blood pressure should be measured at least twice using the same arm. Certain conditions can cause a difference in blood pressure between your right and left arms. If you have a high blood pressure reading during one visit or you have normal blood pressure with other risk factors, you may be asked to: Return on a different day to have your blood pressure checked again. Monitor your blood pressure at home for 1 week or longer. If you are diagnosed with hypertension, you may have other blood or imaging tests to help your health care provider understand your overall risk for other conditions. How is this treated? This condition is treated by making healthy lifestyle changes, such as eating healthy foods, exercising more, and reducing your alcohol intake. You may be referred for counseling on a healthy diet and physical activity. Your health care provider may prescribe medicine if lifestyle changes are not enough to get your blood pressure under control and if: Your systolic blood pressure is above 130. Your diastolic blood pressure is above 80. Your personal target blood pressure may vary depending on your medical conditions, your age, and other factors. Follow these instructions at home: Eating and drinking  Eat a diet that is high in fiber and potassium, and low in sodium, added sugar, and fat. An example of this eating plan is called the DASH diet. DASH stands for Dietary Approaches to Stop Hypertension. To eat this way: Eat   plenty of fresh fruits and vegetables. Try to fill one half of your plate at each meal with fruits and vegetables. Eat whole grains, such as whole-wheat pasta, brown rice, or whole-grain bread. Fill about one  fourth of your plate with whole grains. Eat or drink low-fat dairy products, such as skim milk or low-fat yogurt. Avoid fatty cuts of meat, processed or cured meats, and poultry with skin. Fill about one fourth of your plate with lean proteins, such as fish, chicken without skin, beans, eggs, or tofu. Avoid pre-made and processed foods. These tend to be higher in sodium, added sugar, and fat. Reduce your daily sodium intake. Many people with hypertension should eat less than 1,500 mg of sodium a day. Do not drink alcohol if: Your health care provider tells you not to drink. You are pregnant, may be pregnant, or are planning to become pregnant. If you drink alcohol: Limit how much you have to: 0-1 drink a day for women. 0-2 drinks a day for men. Know how much alcohol is in your drink. In the [place] one drink equals one 12 oz bottle of beer (355 mL), one 5 oz glass of wine (148 mL), or one 1 oz glass of hard liquor (44 mL). Lifestyle  Work with your health care provider to maintain a healthy body weight or to lose weight. Ask what an ideal weight is for you. Get at least 30 minutes of exercise that causes your heart to beat faster (aerobic exercise) most days of the week. Activities may include walking, swimming, or biking. Include exercise to strengthen your muscles (resistance exercise), such as Pilates or lifting weights, as part of your weekly exercise routine. Try to do these types of exercises for 30 minutes at least 3 days a week. Do not use any products that contain nicotine or tobacco. These products include cigarettes, chewing tobacco, and vaping devices, such as e-cigarettes. If you need help quitting, ask your health care provider. Monitor your blood pressure at home as told by your health care provider. Keep all follow-up visits. This is important. Medicines Take over-the-counter and prescription medicines only as told by your health care provider. Follow directions carefully. Blood  pressure medicines must be taken as prescribed. Do not skip doses of blood pressure medicine. Doing this puts you at risk for problems and can make the medicine less effective. Ask your health care provider about side effects or reactions to medicines that you should watch for. Contact a health care provider if you: Think you are having a reaction to a medicine you are taking. Have headaches that keep coming back (recurring). Feel dizzy. Have swelling in your ankles. Have trouble with your vision. Get help right away if you: Develop a severe headache or confusion. Have unusual weakness or numbness. Feel faint. Have severe pain in your chest or abdomen. Vomit repeatedly. Have trouble breathing. These symptoms may be an emergency. Get help right away. Call 911. Do not wait to see if the symptoms will go away. Do not drive yourself to the hospital. Summary Hypertension is when the force of blood pumping through your arteries is too strong. If this condition is not controlled, it may put you at risk for serious complications. Your personal target blood pressure may vary depending on your medical conditions, your age, and other factors. For most people, a normal blood pressure is less than 120/80. Hypertension is treated with lifestyle changes, medicines, or a combination of both. Lifestyle changes include losing weight, eating a healthy,   low-sodium diet, exercising more, and limiting alcohol. This information is not intended to replace advice given to you by your health care provider. Make sure you discuss any questions you have with your health care provider. Document Revised: 04/13/2021 Document Reviewed: 04/13/2021 Elsevier Patient Education  2024 Elsevier Inc.  

## 2023-02-08 LAB — HEPATITIS C ANTIBODY: Hepatitis C Ab: NONREACTIVE

## 2023-02-09 ENCOUNTER — Ambulatory Visit (HOSPITAL_BASED_OUTPATIENT_CLINIC_OR_DEPARTMENT_OTHER)
Admission: RE | Admit: 2023-02-09 | Discharge: 2023-02-09 | Disposition: A | Discharge status: Home / Self Care | Payer: Self-pay | Source: Ambulatory Visit | Attending: Cardiovascular Disease | Admitting: Cardiovascular Disease

## 2023-02-09 ENCOUNTER — Ambulatory Visit (INDEPENDENT_AMBULATORY_CARE_PROVIDER_SITE_OTHER): Payer: Medicare Other

## 2023-02-09 DIAGNOSIS — I471 Supraventricular tachycardia, unspecified: Secondary | ICD-10-CM

## 2023-02-09 DIAGNOSIS — E782 Mixed hyperlipidemia: Secondary | ICD-10-CM | POA: Insufficient documentation

## 2023-02-09 LAB — ECHOCARDIOGRAM COMPLETE
Area-P 1/2: 2.56 cm2
Est EF: 50
S' Lateral: 2.38 cm

## 2023-02-15 ENCOUNTER — Ambulatory Visit (INDEPENDENT_AMBULATORY_CARE_PROVIDER_SITE_OTHER): Payer: Medicare Other

## 2023-02-15 DIAGNOSIS — E538 Deficiency of other specified B group vitamins: Secondary | ICD-10-CM

## 2023-02-15 NOTE — Progress Notes (Signed)
B12 given.  Pt tolerated well. Pt is aware to give the office a call for an side effects or reactions. Please co-sign.   

## 2023-02-17 ENCOUNTER — Telehealth: Payer: Self-pay | Admitting: Cardiovascular Disease

## 2023-02-17 ENCOUNTER — Other Ambulatory Visit: Payer: Self-pay

## 2023-02-17 DIAGNOSIS — J90 Pleural effusion, not elsewhere classified: Secondary | ICD-10-CM

## 2023-02-17 NOTE — Telephone Encounter (Signed)
Called pt and LVM to call office back.  Creatinine is normal. Can we call him and let him know there is a fluid collection in his lungs, but we needed a dedicated lung CT to better evaluate? Please order a chest CT with contrast. Also, ask him about cough, congestion, or infectious symptoms? I would also like to refer him to pulmonary for this.   Gerri Spore T. Flora Lipps, MD, [facility]  [place]  [facility]

## 2023-02-17 NOTE — Telephone Encounter (Signed)
Received a call from Ray with results for this pt. CT Calcium Score- Per Dr. Marin Olp there is a large loculater, lower effusion, empyema along right chest wall laterally and the recommendation is to do follow up CT with contrast.

## 2023-02-17 NOTE — Telephone Encounter (Signed)
Spoke to patient Dr.O'Neal advised fluid in lungs.Advised he needs a chest ct with contrast and see Pulmonary Dr.Chest Ct scheduled at [facility] 9/4 at 1:30 pm Arrive at 1:15 pm.Advised Pulmonary will call back with appointment.Stated he has no cough,no sob and no signs of infection.

## 2023-02-17 NOTE — Telephone Encounter (Signed)
Ray with [facility] calling to report CT results

## 2023-02-21 ENCOUNTER — Other Ambulatory Visit: Payer: Self-pay | Admitting: Internal Medicine

## 2023-02-21 DIAGNOSIS — I2510 Atherosclerotic heart disease of native coronary artery without angina pectoris: Secondary | ICD-10-CM

## 2023-02-21 DIAGNOSIS — E785 Hyperlipidemia, unspecified: Secondary | ICD-10-CM

## 2023-02-21 DIAGNOSIS — Z1211 Encounter for screening for malignant neoplasm of colon: Secondary | ICD-10-CM | POA: Diagnosis not present

## 2023-02-22 ENCOUNTER — Ambulatory Visit (HOSPITAL_COMMUNITY)
Admission: RE | Admit: 2023-02-22 | Discharge: 2023-02-22 | Disposition: A | Discharge status: Home / Self Care | Payer: Medicare Other | Source: Ambulatory Visit | Attending: Cardiovascular Disease | Admitting: Cardiovascular Disease

## 2023-02-22 DIAGNOSIS — J90 Pleural effusion, not elsewhere classified: Secondary | ICD-10-CM | POA: Diagnosis not present

## 2023-02-22 DIAGNOSIS — R918 Other nonspecific abnormal finding of lung field: Secondary | ICD-10-CM | POA: Diagnosis not present

## 2023-02-22 DIAGNOSIS — K862 Cyst of pancreas: Secondary | ICD-10-CM | POA: Diagnosis not present

## 2023-02-22 MED ORDER — IOHEXOL 350 MG/ML SOLN
50.0000 mL | Freq: Once | INTRAVENOUS | Status: AC | PRN
  Administered 2023-02-22: 50 mL via INTRAVENOUS

## 2023-02-26 NOTE — Progress Notes (Unsigned)
Cardiology Office Note:   Date:  02/26/2023  NAME:  Samuel Fisher    MRN: [id number] DOB:  04/27/1946   PCP:  Etta Grandchild, MD  Cardiologist:  None  Electrophysiologist:  None   Referring MD: Etta Grandchild, MD   No chief complaint on file.   History of Present Illness:   Samuel Fisher is a 77 y.o. male with a hx of CAD, HTN, HLD who presents for follow-up. Elevated CAC score. Found to have loculated R pleural effusion.   Problem List CAD -CAC 1836 (88th percentile) HTN HLD -T chol 156, HDL 73, LDL 70, TG 62 4. PACs  -1% burden  -brief SVT 5. Acoustic Neuroma    Past Medical History: Past Medical History:  Diagnosis Date   Allergy    Arthritis    BPH (benign prostatic hyperplasia) 2010   Brain tumor (HCC)    Hearing problem    Hyperlipidemia    Hypertension    Inner ear dysfunction    Urinary retention     Past Surgical History: Past Surgical History:  Procedure Laterality Date   BRAIN TUMOR EXCISION     CARDIAC CATHETERIZATION     NERVE GRAFT     TONSILLECTOMY  1951   TOTAL HIP ARTHROPLASTY     right-2002 and left-2004, 2010   TRANSURETHRAL RESECTION OF PROSTATE N/A 08/05/2021   Procedure: TRANSURETHRAL RESECTION OF THE PROSTATE (TURP) AND ULTRASOUND GUIDED TRANSRECTAL PROSTATE BIOPSY;  Surgeon: Crist Fat, MD;  Location: [facility];  Service: Urology;  Laterality: N/A;   WISDOM TOOTH EXTRACTION     Bottom two    Current Medications: No outpatient medications have been marked as taking for the 03/01/23 encounter (Appointment) with Sande Rives, MD.   Current Facility-Administered Medications for the 03/01/23 encounter (Appointment) with Sande Rives, MD  Medication   cyanocobalamin (VITAMIN B12) injection 1,000 mcg     Allergies:    Bee venom   Social History: Social History   Socioeconomic History   Marital status: Married    Spouse name: Not on file   Number of children: 2   Years of education: Not on  file   Highest education level: Bachelor's degree (e.g., BA, AB, BS)  Occupational History   Occupation: retired   Occupation: Retired Electronics engineer  Tobacco Use   Smoking status: Former    Current packs/day: 0.00    Types: Cigarettes    Quit date: 06/28/1976    Years since quitting: 46.6   Smokeless tobacco: Never  Vaping Use   Vaping status: Never Used  Substance and Sexual Activity   Alcohol use: Yes    Alcohol/week: 3.0 standard drinks of alcohol    Types: 3 Cans of beer per week    Comment: 2-3 beers per day and occasional drink on the weekends   Drug use: No   Sexual activity: Yes  Other Topics Concern   Not on file  Social History Narrative   Not on file   Social Determinants of Health   Financial Resource Strain: Low Risk  (02/05/2023)   Overall Financial Resource Strain (CARDIA)    Difficulty of Paying Living Expenses: Not hard at all  Food Insecurity: No Food Insecurity (02/05/2023)   Hunger Vital Sign    Worried About Running Out of Food in the Last Year: Never true    Ran Out of Food in the Last Year: Never true  Transportation Needs: No Transportation Needs (02/05/2023)   PRAPARE -  Administrator, Civil Service (Medical): No    Lack of Transportation (Non-Medical): No  Physical Activity: Insufficiently Active (02/05/2023)   Exercise Vital Sign    Days of Exercise per Week: 3 days    Minutes of Exercise per Session: 30 min  Stress: No Stress Concern Present (02/05/2023)   [employer] of Occupational Health - Occupational Stress Questionnaire    Feeling of Stress : Not at all  Social Connections: Socially Integrated (02/05/2023)   Social Connection and Isolation Panel [NHANES]    Frequency of Communication with Friends and Family: Three times a week    Frequency of Social Gatherings with Friends and Family: Twice a week    Attends Religious Services: More than 4 times per year    Active Member of [employer] or Organizations: Yes    Attends Probation officer: 1 to 4 times per year    Marital Status: Married     Family History: The patient's family history includes Arthritis in an other family member; Early death in an other family member; Heart attack in his brother and maternal grandfather; Heart disease in an other family member; Hypertension in his brother, father, and another family member; Mental illness in an other family member; Rheum arthritis in his mother; Stroke in his brother and another family member; Throat cancer in his father; Uterine cancer in his mother. There is no history of Diabetes or Hyperlipidemia.  ROS:   All other ROS reviewed and negative. Pertinent positives noted in the HPI.     EKGs/Labs/Other Studies Reviewed:   The following studies were personally reviewed by me today:  EKG:  EKG is *** ordered today.        Recent Labs: 08/23/2022: ALT 17; TSH 2.22 02/07/2023: BUN 18; Creatinine, Ser 1.16; Hemoglobin 12.9; Platelets 175.0; Potassium 3.6; Sodium 137   Recent Lipid Panel    Component Value Date/Time   CHOL 156 08/23/2022 1519   TRIG 62.0 08/23/2022 1519   HDL 73.90 08/23/2022 1519   CHOLHDL 2 08/23/2022 1519   VLDL 12.4 08/23/2022 1519   LDLCALC 70 08/23/2022 1519    Physical Exam:   VS:  There were no vitals taken for this visit.   Wt Readings from Last 3 Encounters:  02/07/23 169 lb (76.7 kg)  12/29/22 171 lb 12.8 oz (77.9 kg)  11/23/22 168 lb (76.2 kg)    General: Well nourished, well developed, in no acute distress Head: Atraumatic, normal size  Eyes: PEERLA, EOMI  Neck: Supple, no JVD Endocrine: No thryomegaly Cardiac: Normal S1, S2; RRR; no murmurs, rubs, or gallops Lungs: Clear to auscultation bilaterally, no wheezing, rhonchi or rales  Abd: Soft, nontender, no hepatomegaly  Ext: No edema, pulses 2+ Musculoskeletal: No deformities, BUE and BLE strength normal and equal Skin: Warm and dry, no rashes   Neuro: Alert and oriented to person, place, time, and  situation, CNII-XII grossly intact, no focal deficits  Psych: Normal mood and affect   ASSESSMENT:   LEAM SAYE is a 77 y.o. male who presents for the following: No diagnosis found.  PLAN:   There are no diagnoses linked to this encounter.  {Are you ordering a CV Procedure (e.g. stress test, cath, DCCV, TEE, etc)?   Press F2        :[id number]}  Disposition: No follow-ups on file.  Medication Adjustments/Labs and Tests Ordered: Current medicines are reviewed at length with the patient today.  Concerns regarding medicines are outlined above.  No  orders of the defined types were placed in this encounter.  No orders of the defined types were placed in this encounter.  There are no Patient Instructions on file for this visit.   Time Spent with Patient: I have spent a total of *** minutes with patient reviewing hospital notes, telemetry, EKGs, labs and examining the patient as well as establishing an assessment and plan that was discussed with the patient.  > 50% of time was spent in direct patient care.  Signed, Lenna Gilford. Flora Lipps, MD, [facility]  [facility]  [address], [address] [place], [place] [address] [phone]  02/26/2023 7:34 PM

## 2023-02-27 LAB — COLOGUARD: COLOGUARD: NEGATIVE

## 2023-03-01 ENCOUNTER — Ambulatory Visit: Payer: Medicare Other | Attending: Cardiovascular Disease | Admitting: Cardiovascular Disease

## 2023-03-01 ENCOUNTER — Encounter: Payer: Self-pay | Admitting: Cardiovascular Disease

## 2023-03-01 VITALS — BP 138/78 | HR 78 | Ht 68.0 in | Wt 172.0 lb

## 2023-03-01 DIAGNOSIS — E782 Mixed hyperlipidemia: Secondary | ICD-10-CM | POA: Diagnosis not present

## 2023-03-01 DIAGNOSIS — J90 Pleural effusion, not elsewhere classified: Secondary | ICD-10-CM

## 2023-03-01 DIAGNOSIS — I2510 Atherosclerotic heart disease of native coronary artery without angina pectoris: Secondary | ICD-10-CM

## 2023-03-01 NOTE — Patient Instructions (Signed)
Medication Instructions:  START ASPIRIN 81 MG EVERY DAY  *If you need a refill on your cardiac medications before your next appointment, please call your pharmacy*   Lab Work: NONE If you have labs (blood work) drawn today and your tests are completely normal, you will receive your results only by: MyChart Message (if you have MyChart) OR A paper copy in the mail If you have any lab test that is abnormal or we need to change your treatment, we will call you to review the results.   Testing/Procedures: Your physician has requested that you have a lexiscan myoview. For further information please visit [email or web address] Please follow instruction sheet, as given. [address] [facility]    Follow-Up: At [facility], you and your health needs are our priority.  As part of our continuing mission to provide you with exceptional heart care, we have created designated Provider Care Teams.  These Care Teams include your primary Cardiologist (physician) and Advanced Practice Providers (APPs -  Physician Assistants and Nurse Practitioners) who all work together to provide you with the care you need, when you need it.  We recommend signing up for the patient portal called "MyChart".  Sign up information is provided on this After Visit Summary.  MyChart is used to connect with patients for Virtual Visits (Telemedicine).  Patients are able to view lab/test results, encounter notes, upcoming appointments, etc.  Non-urgent messages can be sent to your provider as well.   To learn more about what you can do with MyChart, go to [email or web address].    Your next appointment:   1 year(s)  Provider:   DR Scharlene Gloss    Other Instructions NONE

## 2023-03-07 ENCOUNTER — Telehealth: Payer: Self-pay | Admitting: *Deleted

## 2023-03-07 NOTE — Telephone Encounter (Signed)
Patient reviewed via mychart. Called left detail message on secure voicemail.  Left #  [phone] of [facility] Patient need to contact to schedule appt. [facility] scheduler sent message via mychart to schedule ) Any question may call back

## 2023-03-07 NOTE — Telephone Encounter (Signed)
-----   Message from Reatha Harps sent at 03/05/2023 10:53 PM EDT ----- Concerns for loculated pleural effusion.  Please refer him to  pulmonary, Dr. Mechele Collin.  He had no infectious symptoms in the office.  Suspect they will just follow this.  I have also CCed Dr. Yetta Barre.  There was concern for enlargement of a pancreatic cyst.  This will need follow-up.  I will send a MyChart message.  Gerri Spore T. Flora Lipps, MD, [facility]  [facility]  [address], [address] [place], [place] [address] [phone]  10:51 PM

## 2023-03-21 ENCOUNTER — Telehealth (HOSPITAL_COMMUNITY): Payer: Self-pay | Admitting: *Deleted

## 2023-03-21 NOTE — Telephone Encounter (Signed)
Spoke with pt and gave instructions for MPI study. 

## 2023-03-22 ENCOUNTER — Ambulatory Visit (INDEPENDENT_AMBULATORY_CARE_PROVIDER_SITE_OTHER): Payer: Medicare Other

## 2023-03-22 DIAGNOSIS — E538 Deficiency of other specified B group vitamins: Secondary | ICD-10-CM

## 2023-03-22 MED ORDER — CYANOCOBALAMIN 1000 MCG/ML IJ SOLN
1000.0000 ug | Freq: Once | INTRAMUSCULAR | Status: AC
Start: 2023-03-22 — End: 2023-03-22
  Administered 2023-03-22: 1000 ug via INTRAMUSCULAR

## 2023-03-22 NOTE — Progress Notes (Signed)
After obtaining consent, and per orders of Dr. Yetta Barre, injection of B12 given by Ferdie Ping. Patient instructed to remain in clinic report any adverse reaction to me immediately.

## 2023-03-23 ENCOUNTER — Ambulatory Visit (HOSPITAL_COMMUNITY): Payer: Medicare Other | Attending: Cardiovascular Disease

## 2023-03-23 DIAGNOSIS — E782 Mixed hyperlipidemia: Secondary | ICD-10-CM | POA: Diagnosis not present

## 2023-03-23 DIAGNOSIS — I2510 Atherosclerotic heart disease of native coronary artery without angina pectoris: Secondary | ICD-10-CM | POA: Diagnosis not present

## 2023-03-23 LAB — MYOCARDIAL PERFUSION IMAGING
Estimated workload: 1
Exercise duration (min): 1 min
Exercise duration (sec): 0 s
LV dias vol: 85 mL (ref 62–150)
LV sys vol: 40 mL
MPHR: 145 {beats}/min
Nuc Stress EF: 52 %
Peak HR: 111 {beats}/min
Percent HR: 76 %
Rest HR: 91 {beats}/min
Rest Nuclear Isotope Dose: 10.7 mCi
SDS: 2
SRS: 0
SSS: 2
ST Depression (mm): 0 mm
Stress Nuclear Isotope Dose: 31.2 mCi
TID: 1.17

## 2023-03-23 MED ORDER — TECHNETIUM TC 99M TETROFOSMIN IV KIT
31.2000 | PACK | Freq: Once | INTRAVENOUS | Status: AC | PRN
  Administered 2023-03-23: 31.2 via INTRAVENOUS

## 2023-03-23 MED ORDER — REGADENOSON 0.4 MG/5ML IV SOLN
0.4000 mg | Freq: Once | INTRAVENOUS | Status: AC
  Administered 2023-03-23: 0.4 mg via INTRAVENOUS

## 2023-03-23 MED ORDER — TECHNETIUM TC 99M TETROFOSMIN IV KIT
10.7000 | PACK | Freq: Once | INTRAVENOUS | Status: AC | PRN
  Administered 2023-03-23: 10.7 via INTRAVENOUS

## 2023-03-24 DIAGNOSIS — C61 Malignant neoplasm of prostate: Secondary | ICD-10-CM | POA: Diagnosis not present

## 2023-03-31 DIAGNOSIS — C61 Malignant neoplasm of prostate: Secondary | ICD-10-CM | POA: Diagnosis not present

## 2023-04-18 ENCOUNTER — Other Ambulatory Visit: Payer: Self-pay | Admitting: Internal Medicine

## 2023-04-18 DIAGNOSIS — I10 Essential (primary) hypertension: Secondary | ICD-10-CM

## 2023-05-01 ENCOUNTER — Encounter: Payer: Self-pay | Admitting: Pulmonary Disease

## 2023-05-01 ENCOUNTER — Ambulatory Visit: Payer: Medicare Other | Admitting: Pulmonary Disease

## 2023-05-01 VITALS — BP 118/74 | HR 85 | Temp 98.9°F | Ht 68.0 in | Wt 172.2 lb

## 2023-05-01 DIAGNOSIS — J90 Pleural effusion, not elsewhere classified: Secondary | ICD-10-CM | POA: Diagnosis not present

## 2023-05-01 NOTE — Progress Notes (Signed)
@Patient  ID: Samuel Fisher, male    DOB: Oct 28, 1945, 77 y.o.   MRN: [id number]  Chief Complaint  Patient presents with   Consult    Pleural effusion on chest CT scan 03/05/2023.  Patient states no sx noted.    Referring provider: Sande Rives, *  HPI:   77 y.o. man whom we are seeing for evaluation of pleural effusion.  There is recent PCP note reviewed.  Most recent cardiology note reviewed.  Patient is asymptomatic.  Denies any issues with breathing etc.  No pain or discomfort in the area.  He denies any trauma to the area.  No falls, MVC etc.  No assault.  Not aware of bumping on anything but it is possible.  Does not take blood thinners.  Does use ibuprofen up to 3 times a day for chronic hip pain.  He was referred for a cardiac CT scan 01/2023 via his PCP.  On my review interpretation this reveals a loculated small left-sided effusion laterally.  Subsequent follow-up CT scan with contrast of the chest 02/2023 on my review interpretation reveals similar size likely a small left-sided effusion with evidence of pleural thickening and some evidence of different density in the fluid measuring up to 3 cm in thickness.  No mass or significant nodules etc.  No sign of fracture etc.    Questionaires / Pulmonary Flowsheets:   ACT:      No data to display          MMRC:     No data to display          Epworth:      No data to display          Tests:   FENO:  No results found for: "NITRICOXIDE"  PFT:     No data to display          WALK:      No data to display          Imaging: Personally reviewed and as per EMR and discussion this note No results found.  Lab Results: Personally reviewed CBC    Component Value Date/Time   WBC 5.9 02/07/2023 1549   RBC 4.09 (L) 02/07/2023 1549   HGB 12.9 (L) 02/07/2023 1549   HCT 38.6 (L) 02/07/2023 1549   PLT 175.0 02/07/2023 1549   MCV 94.5 02/07/2023 1549   MCH 31.2 08/06/2021 0341   MCHC 33.4  02/07/2023 1549   RDW 13.8 02/07/2023 1549   LYMPHSABS 1.1 02/07/2023 1549   MONOABS 0.6 02/07/2023 1549   EOSABS 0.2 02/07/2023 1549   BASOSABS 0.0 02/07/2023 1549    BMET    Component Value Date/Time   NA 137 02/07/2023 1549   K 3.6 02/07/2023 1549   CL 104 02/07/2023 1549   CO2 25 02/07/2023 1549   GLUCOSE 117 (H) 02/07/2023 1549   BUN 18 02/07/2023 1549   CREATININE 1.16 02/07/2023 1549   CREATININE 1.04 03/17/2020 1612   CALCIUM 9.3 02/07/2023 1549   GFRNONAA >60 08/06/2021 0341   GFRNONAA 71 03/17/2020 1612   GFRAA 83 03/17/2020 1612    BNP No results found for: "BNP"  ProBNP No results found for: "PROBNP"  Specialty Problems   None   Allergies  Allergen Reactions   Bee Venom Anaphylaxis    Allergic to Bee Sting -unknown reaction    Immunization History  Administered Date(s) Administered   Fluad Quad(high Dose 65+) 03/18/2019, 04/16/2020, 05/10/2021, 06/07/2022   Influenza Split  06/28/2012   Influenza, High Dose Seasonal PF 03/12/2014, 07/14/2016, 07/17/2017, 07/05/2018   Influenza, Seasonal, Injecte, Preservative Fre 06/27/2013, 04/06/2015   Influenza,inj,Quad PF,6+ Mos 06/27/2013, 04/06/2015   Moderna SARS-COV2 Booster Vaccination 06/18/2020   Moderna Sars-Covid-2 Vaccination 08/15/2019, 09/12/2019   Pneumococcal Conjugate-13 04/06/2015   Pneumococcal Polysaccharide-23 07/17/2017, 08/23/2022   Tdap 02/01/2007, 07/14/2016, 06/10/2021   Zoster Recombinant(Shingrix) 11/16/2021    Past Medical History:  Diagnosis Date   Allergy    Arthritis    BPH (benign prostatic hyperplasia) 2010   Brain tumor (HCC)    Hearing problem    Hyperlipidemia    Hypertension    Inner ear dysfunction    Urinary retention     Tobacco History: Social History   Tobacco Use  Smoking Status Former   Current packs/day: 0.00   Types: Cigarettes   Quit date: 06/28/1976   Years since quitting: 46.8  Smokeless Tobacco Never   Counseling given: Not  Answered   Continue to not smoke  Outpatient Encounter Medications as of 05/01/2023  Medication Sig   Cyanocobalamin (VITAMIN B-12 IJ) Inject 1,000 mcg as directed every 30 (thirty) days.   diazepam (VALIUM) 10 MG tablet Take 10 mg by mouth once as needed for anxiety (once if needed for dental procedures).   fluocinonide-emollient (LIDEX-E) 0.05 % cream Apply 1 Application topically 2 (two) times daily.   hydrALAZINE (APRESOLINE) 50 MG tablet Take 1 tablet (50 mg total) by mouth 3 (three) times daily.   rosuvastatin (CRESTOR) 10 MG tablet TAKE ONE TABLET BY MOUTH DAILY   Facility-Administered Encounter Medications as of 05/01/2023  Medication   cyanocobalamin (VITAMIN B12) injection 1,000 mcg     Review of Systems  Review of Systems  No chest pain with exertion.  No orthopnea or PND.  Comprehensive review of systems otherwise negative. Physical Exam  BP 118/74 (BP Location: Left Arm, Patient Position: Sitting, Cuff Size: Large)   Pulse 85   Temp 98.9 F (37.2 C) (Oral)   Ht 5\' 8"  (1.727 m)   Wt 172 lb 3.2 oz (78.1 kg)   SpO2 96%   BMI 26.18 kg/m   Wt Readings from Last 5 Encounters:  05/01/23 172 lb 3.2 oz (78.1 kg)  03/01/23 172 lb (78 kg)  02/07/23 169 lb (76.7 kg)  12/29/22 171 lb 12.8 oz (77.9 kg)  11/23/22 168 lb (76.2 kg)    BMI Readings from Last 5 Encounters:  05/01/23 26.18 kg/m  03/01/23 26.15 kg/m  02/07/23 25.70 kg/m  12/29/22 26.12 kg/m  11/23/22 25.54 kg/m     Physical Exam General: Sitting in chair, no acute distress Eyes: EOMI, no icterus Neck: Supple, no JVP Pulmonary: Normal work of breathing, on room air Cardiovascular: Warm, no edema Abdomen: Nondistended bowel is present MSK: No synovitis, no joint effusion Neuro: Normal gait, no weakness Psych: Normal mood, full affect   Assessment & Plan:   Loculated pleural effusion: On the right.  Up to 3 cm size.  Relatively small.  Asymptomatic.  Unclear etiology.  Denies any trauma to  the area.  Not on blood thinners but does state takes ibuprofen frequently due to chronic hip pain.  Loculation argues for initial inflammation/inflammatory cause versus inflammation due to chronicity.  Hemothorax considered.  No infectious symptoms, not acting like empyema.  Denies history of pneumonia.  Therapeutic drainage unlikely to be beneficial given lack of symptoms but a diagnostic procedure is warranted.  Discussed in detail.  He agrees.  Order for thoracentesis via IR placed.  Consider CT-guided if too small with ultrasound.  Applicable pleural studies were selected in the order.   No follow-ups on file.   Karren Burly, MD 05/01/2023   This appointment required 61 minutes of patient care (this includes precharting, chart review, review of results, face-to-face care, etc.).

## 2023-05-05 ENCOUNTER — Ambulatory Visit (HOSPITAL_COMMUNITY)
Admission: RE | Admit: 2023-05-05 | Discharge: 2023-05-05 | Disposition: A | Discharge status: Home / Self Care | Payer: Medicare Other | Source: Ambulatory Visit | Attending: Radiology | Admitting: Radiology

## 2023-05-05 ENCOUNTER — Ambulatory Visit (HOSPITAL_COMMUNITY)
Admission: RE | Admit: 2023-05-05 | Discharge: 2023-05-05 | Disposition: A | Discharge status: Home / Self Care | Payer: Medicare Other | Source: Ambulatory Visit | Attending: Pulmonary Disease | Admitting: Pulmonary Disease

## 2023-05-05 DIAGNOSIS — J90 Pleural effusion, not elsewhere classified: Secondary | ICD-10-CM | POA: Insufficient documentation

## 2023-05-05 DIAGNOSIS — J929 Pleural plaque without asbestos: Secondary | ICD-10-CM | POA: Diagnosis not present

## 2023-05-05 DIAGNOSIS — Z48813 Encounter for surgical aftercare following surgery on the respiratory system: Secondary | ICD-10-CM | POA: Diagnosis not present

## 2023-05-05 LAB — BODY FLUID CELL COUNT WITH DIFFERENTIAL: Total Nucleated Cell Count, Fluid: 0 uL (ref 0–1000)

## 2023-05-05 LAB — PROTEIN, PLEURAL OR PERITONEAL FLUID: Total protein, fluid: 4.6 g/dL

## 2023-05-05 LAB — LACTATE DEHYDROGENASE, PLEURAL OR PERITONEAL FLUID: LD, Fluid: 220 U/L — ABNORMAL HIGH (ref 3–23)

## 2023-05-05 LAB — GLUCOSE, PLEURAL OR PERITONEAL FLUID: Glucose, Fluid: 103 mg/dL

## 2023-05-05 MED ORDER — LIDOCAINE HCL 1 % IJ SOLN
INTRAMUSCULAR | Status: AC
  Filled 2023-05-05: qty 20

## 2023-05-05 NOTE — Procedures (Signed)
Ultrasound-guided diagnostic and therapeutic right thoracentesis performed by Dr. Milford Cage yielding 300 cc of bloody fluid. No immediate complications. Follow-up chest x-ray pending. The fluid was sent to the lab for preordered studies. EBL none.

## 2023-05-09 LAB — BODY FLUID CULTURE W GRAM STAIN
Culture: NO GROWTH
Gram Stain: NONE SEEN
Special Requests: NORMAL

## 2023-05-10 LAB — CYTOLOGY - NON PAP

## 2023-05-23 ENCOUNTER — Ambulatory Visit: Payer: Medicare Other | Admitting: Internal Medicine

## 2023-05-24 ENCOUNTER — Ambulatory Visit: Payer: Medicare Other

## 2023-05-29 ENCOUNTER — Ambulatory Visit (INDEPENDENT_AMBULATORY_CARE_PROVIDER_SITE_OTHER): Payer: Medicare Other

## 2023-05-29 DIAGNOSIS — E538 Deficiency of other specified B group vitamins: Secondary | ICD-10-CM

## 2023-05-29 MED ORDER — CYANOCOBALAMIN 1000 MCG/ML IJ SOLN
1000.0000 ug | Freq: Once | INTRAMUSCULAR | Status: AC
  Administered 2023-05-29: 1000 ug via INTRAMUSCULAR

## 2023-05-29 NOTE — Progress Notes (Signed)
PT visits today for their b-12 injection. PT was informed of what they were receiving and tolerated injection well. PT notified to reach out to office if needed.

## 2023-06-26 DIAGNOSIS — K080 Exfoliation of teeth due to systemic causes: Secondary | ICD-10-CM | POA: Diagnosis not present

## 2023-07-24 ENCOUNTER — Other Ambulatory Visit: Payer: Self-pay | Admitting: Internal Medicine

## 2023-07-24 ENCOUNTER — Ambulatory Visit (INDEPENDENT_AMBULATORY_CARE_PROVIDER_SITE_OTHER)
Admission: RE | Admit: 2023-07-24 | Discharge: 2023-07-24 | Disposition: A | Discharge status: Home / Self Care | Payer: Medicare Other | Source: Ambulatory Visit | Attending: Family Medicine | Admitting: Family Medicine

## 2023-07-24 ENCOUNTER — Encounter: Payer: Self-pay | Admitting: Family Medicine

## 2023-07-24 ENCOUNTER — Ambulatory Visit (INDEPENDENT_AMBULATORY_CARE_PROVIDER_SITE_OTHER): Payer: Medicare Other | Admitting: Family Medicine

## 2023-07-24 VITALS — BP 130/72 | HR 105 | Temp 98.6°F | Resp 22 | Ht 68.0 in | Wt 170.0 lb

## 2023-07-24 DIAGNOSIS — R0602 Shortness of breath: Secondary | ICD-10-CM

## 2023-07-24 DIAGNOSIS — J189 Pneumonia, unspecified organism: Secondary | ICD-10-CM | POA: Diagnosis not present

## 2023-07-24 DIAGNOSIS — R0989 Other specified symptoms and signs involving the circulatory and respiratory systems: Secondary | ICD-10-CM | POA: Diagnosis not present

## 2023-07-24 DIAGNOSIS — R5383 Other fatigue: Secondary | ICD-10-CM | POA: Diagnosis not present

## 2023-07-24 DIAGNOSIS — J90 Pleural effusion, not elsewhere classified: Secondary | ICD-10-CM | POA: Diagnosis not present

## 2023-07-24 DIAGNOSIS — J9811 Atelectasis: Secondary | ICD-10-CM | POA: Diagnosis not present

## 2023-07-24 DIAGNOSIS — I10 Essential (primary) hypertension: Secondary | ICD-10-CM

## 2023-07-24 MED ORDER — ALBUTEROL SULFATE HFA 108 (90 BASE) MCG/ACT IN AERS: 2.0000 | INHALATION_SPRAY | Freq: Four times a day (QID) | RESPIRATORY_TRACT | 0 refills | Status: DC | PRN

## 2023-07-24 NOTE — Progress Notes (Signed)
Assessment & Plan:  1-2. Shortness of breath (Primary)/Abnormal lung sounds Education provided on shortness of breath. Patient educated on the proper technique of the Albuterol inhaler; patient verbalized understanding. Chest x-ray to evaluate for pneumonia.  - albuterol (VENTOLIN HFA) 108 (90 Base) MCG/ACT inhaler; Inhale 2 puffs into the lungs every 6 (six) hours as needed.  Dispense: 18 g; Refill: 0 - DG Chest 2 View; Future   Follow up plan: Return if symptoms worsen or fail to improve.  Deliah Boston, MSN, APRN, FNP-C  Subjective:  HPI: Samuel Fisher is a 78 y.o. male presenting on 07/24/2023 for Shortness of Breath (Started 2-3 weeks ago - was FLU positive about 3 weeks ago. /)  Patient complains of shortness of breath and fatigue. He denies  any upper respiratory symptoms . Onset of symptoms was 3 weeks ago when he had the flu. His other symptoms resolved, except this shortness of breath. He is drinking plenty of fluids. Treatment to date: none. He does not smoke.    ROS: Negative unless specifically indicated above in HPI.   Relevant past medical history reviewed and updated as indicated.   Allergies and medications reviewed and updated.   Current Outpatient Medications:    Cyanocobalamin (VITAMIN B-12 IJ), Inject 1,000 mcg as directed every 30 (thirty) days., Disp: , Rfl:    diazepam (VALIUM) 10 MG tablet, Take 10 mg by mouth once as needed for anxiety (once if needed for dental procedures)., Disp: , Rfl:    fluocinonide-emollient (LIDEX-E) 0.05 % cream, Apply 1 Application topically 2 (two) times daily., Disp: 120 g, Rfl: 1   hydrALAZINE (APRESOLINE) 50 MG tablet, Take 1 tablet (50 mg total) by mouth 3 (three) times daily., Disp: 270 tablet, Rfl: 0   rosuvastatin (CRESTOR) 10 MG tablet, TAKE ONE TABLET BY MOUTH DAILY, Disp: 90 tablet, Rfl: 1  Current Facility-Administered Medications:    cyanocobalamin (VITAMIN B12) injection 1,000 mcg, 1,000 mcg, Intramuscular, Q30  days, Etta Grandchild, MD, 1,000 mcg at 02/15/23 1410  Allergies  Allergen Reactions   Bee Venom Anaphylaxis    Allergic to Bee Sting -unknown reaction    Objective:   BP 130/72   Pulse (!) 105   Temp 98.6 F (37 C)   Resp (!) 22   Ht 5\' 8"  (1.727 m)   Wt 170 lb (77.1 kg)   SpO2 98%   BMI 25.85 kg/m    Physical Exam Vitals reviewed.  Constitutional:      General: He is not in acute distress.    Appearance: Normal appearance. He is not ill-appearing, toxic-appearing or diaphoretic.  HENT:     Head: Normocephalic and atraumatic.  Eyes:     General: No scleral icterus.       Right eye: No discharge.        Left eye: No discharge.     Conjunctiva/sclera: Conjunctivae normal.  Cardiovascular:     Rate and Rhythm: Normal rate and regular rhythm.     Heart sounds: Normal heart sounds. No murmur heard.    No friction rub. No gallop.  Pulmonary:     Effort: Pulmonary effort is normal. No respiratory distress.     Breath sounds: No stridor. Examination of the right-lower field reveals decreased breath sounds and rales. Decreased breath sounds and rales present. No wheezing or rhonchi.  Musculoskeletal:        General: Normal range of motion.     Cervical back: Normal range of motion.  Skin:  General: Skin is warm and dry.  Neurological:     Mental Status: He is alert and oriented to person, place, and time. Mental status is at baseline.  Psychiatric:        Mood and Affect: Mood normal.        Behavior: Behavior normal.        Thought Content: Thought content normal.        Judgment: Judgment normal.

## 2023-07-25 ENCOUNTER — Encounter: Payer: Self-pay | Admitting: Family Medicine

## 2023-07-25 DIAGNOSIS — R0602 Shortness of breath: Secondary | ICD-10-CM

## 2023-07-25 MED ORDER — AZITHROMYCIN 250 MG PO TABS: ORAL_TABLET | ORAL | 0 refills | Status: DC

## 2023-07-25 MED ORDER — AMOXICILLIN-POT CLAVULANATE 875-125 MG PO TABS: 1.0000 | ORAL_TABLET | Freq: Two times a day (BID) | ORAL | 0 refills | Status: AC

## 2023-07-25 NOTE — Addendum Note (Signed)
Addended by: Gwenlyn Fudge on: 07/25/2023 09:43 AM   Modules accepted: Orders

## 2023-08-02 MED ORDER — ALBUTEROL SULFATE HFA 108 (90 BASE) MCG/ACT IN AERS: 2.0000 | INHALATION_SPRAY | Freq: Four times a day (QID) | RESPIRATORY_TRACT | 0 refills | Status: AC | PRN

## 2023-09-06 ENCOUNTER — Ambulatory Visit: Payer: Medicare Other | Admitting: Pulmonary Disease

## 2023-09-06 ENCOUNTER — Encounter: Payer: Self-pay | Admitting: Pulmonary Disease

## 2023-09-06 ENCOUNTER — Telehealth: Payer: Self-pay | Admitting: Internal Medicine

## 2023-09-06 VITALS — BP 137/87 | HR 122 | Temp 98.1°F | Ht 68.0 in | Wt 171.6 lb

## 2023-09-06 DIAGNOSIS — J942 Hemothorax: Secondary | ICD-10-CM

## 2023-09-06 DIAGNOSIS — J90 Pleural effusion, not elsewhere classified: Secondary | ICD-10-CM

## 2023-09-06 DIAGNOSIS — R0609 Other forms of dyspnea: Secondary | ICD-10-CM

## 2023-09-06 LAB — CBC
HCT: 38.1 % — ABNORMAL LOW (ref 39.0–52.0)
Hemoglobin: 12.7 g/dL — ABNORMAL LOW (ref 13.0–17.0)
MCHC: 33.4 g/dL (ref 30.0–36.0)
MCV: 86.3 fl (ref 78.0–100.0)
Platelets: 305 10*3/uL (ref 150.0–400.0)
RBC: 4.42 Mil/uL (ref 4.22–5.81)
RDW: 16.3 % — ABNORMAL HIGH (ref 11.5–15.5)
WBC: 11.4 10*3/uL — ABNORMAL HIGH (ref 4.0–10.5)

## 2023-09-06 LAB — BRAIN NATRIURETIC PEPTIDE: Pro B Natriuretic peptide (BNP): 207 pg/mL — ABNORMAL HIGH (ref 0.0–100.0)

## 2023-09-06 NOTE — Addendum Note (Signed)
 Addended byVilma Meckel on: 09/06/2023 03:05 PM   Modules accepted: Orders

## 2023-09-06 NOTE — Telephone Encounter (Signed)
 LM per DPR that Dr.Jones gave ok to start back on B12 injections, ok to call office and schedule. Provided office number for any questions

## 2023-09-06 NOTE — Telephone Encounter (Signed)
 Can pt start back up B12 injections?

## 2023-09-06 NOTE — Addendum Note (Signed)
 Addended byVilma Meckel on: 09/06/2023 03:29 PM   Modules accepted: Orders

## 2023-09-06 NOTE — Telephone Encounter (Signed)
 Patient wants to know if he can start his B12 shots back up.  Last one was in December, 2024.  Please call patient and let him know.  Phone:  [phone]

## 2023-09-06 NOTE — Progress Notes (Signed)
 @Patient  ID: Samuel Fisher, male    DOB: Feb 02, 1946, 78 y.o.   MRN: [id number]  Chief Complaint  Patient presents with   Follow-up    Referring provider: Etta Grandchild, MD  HPI:   78 y.o. man whom we are seeing for evaluation of pleural effusion.  Most recent PCP note reviewed.  Returns for follow-up.  More dyspneic over the last 3 weeks or so.  Initially denies significant symptoms with the pleural effusion at last visit.  However he notes that it was drained back in November 2024 he felt better could sleep better breathing is better etc.  I done well for a period of couple months 3 months or so.  Got worse in the last 2 or 3 weeks.  Had a repeat chest x-ray last month that demonstrated recurrence of loculated right effusion.    Reviewed pleural fluid studies, no nucleated cells, bloody appearance.  Exudative.  Suspect hemothorax.  Unclear why this recurs other than local inflammation related to initial injury with reaccumulation of fluid.  Discussed thoracentesis again.  If recurs a third time would consider chest tube placement and pleurodesis.  We offered this today but he declined trying to avoid the hospital etc.  HPI initial visit: Patient is asymptomatic.  Denies any issues with breathing etc.  No pain or discomfort in the area.  He denies any trauma to the area.  No falls, MVC etc.  No assault.  Not aware of bumping on anything but it is possible.  Does not take blood thinners.  Does use ibuprofen up to 3 times a day for chronic hip pain.  He was referred for a cardiac CT scan 01/2023 via his PCP.  On my review interpretation this reveals a loculated small left-sided effusion laterally.  Subsequent follow-up CT scan with contrast of the chest 02/2023 on my review interpretation reveals similar size likely a small left-sided effusion with evidence of pleural thickening and some evidence of different density in the fluid measuring up to 3 cm in thickness.  No mass or significant  nodules etc.  No sign of fracture etc.    Questionaires / Pulmonary Flowsheets:   ACT:      No data to display          MMRC:     No data to display          Epworth:      No data to display          Tests:   FENO:  No results found for: "NITRICOXIDE"  PFT:     No data to display          WALK:      No data to display          Imaging: Personally reviewed and as per EMR and discussion this note No results found.  Lab Results: Personally reviewed CBC    Component Value Date/Time   WBC 5.9 02/07/2023 1549   RBC 4.09 (L) 02/07/2023 1549   HGB 12.9 (L) 02/07/2023 1549   HCT 38.6 (L) 02/07/2023 1549   PLT 175.0 02/07/2023 1549   MCV 94.5 02/07/2023 1549   MCH 31.2 08/06/2021 0341   MCHC 33.4 02/07/2023 1549   RDW 13.8 02/07/2023 1549   LYMPHSABS 1.1 02/07/2023 1549   MONOABS 0.6 02/07/2023 1549   EOSABS 0.2 02/07/2023 1549   BASOSABS 0.0 02/07/2023 1549    BMET    Component Value Date/Time   NA 137 02/07/2023 1549  K 3.6 02/07/2023 1549   CL 104 02/07/2023 1549   CO2 25 02/07/2023 1549   GLUCOSE 117 (H) 02/07/2023 1549   BUN 18 02/07/2023 1549   CREATININE 1.16 02/07/2023 1549   CREATININE 1.04 03/17/2020 1612   CALCIUM 9.3 02/07/2023 1549   GFRNONAA >60 08/06/2021 0341   GFRNONAA 71 03/17/2020 1612   GFRAA 83 03/17/2020 1612    BNP No results found for: "BNP"  ProBNP No results found for: "PROBNP"  Specialty Problems   None   Allergies  Allergen Reactions   Bee Venom Anaphylaxis    Allergic to Bee Sting -unknown reaction    Immunization History  Administered Date(s) Administered   Fluad Quad(high Dose 65+) 03/18/2019, 04/16/2020, 05/10/2021, 06/07/2022   Influenza Split 06/28/2012   Influenza, High Dose Seasonal PF 03/12/2014, 07/14/2016, 07/17/2017, 07/05/2018   Influenza, Seasonal, Injecte, Preservative Fre 06/27/2013, 04/06/2015   Influenza,inj,Quad PF,6+ Mos 06/27/2013, 04/06/2015   Moderna SARS-COV2  Booster Vaccination 06/18/2020   Moderna Sars-Covid-2 Vaccination 08/15/2019, 09/12/2019   Pneumococcal Conjugate-13 04/06/2015   Pneumococcal Polysaccharide-23 07/17/2017, 08/23/2022   Tdap 02/01/2007, 07/14/2016, 06/10/2021   Zoster Recombinant(Shingrix) 11/16/2021    Past Medical History:  Diagnosis Date   Allergy    Arthritis    BPH (benign prostatic hyperplasia) 2010   Brain tumor (HCC)    Hearing problem    Hyperlipidemia    Hypertension    Inner ear dysfunction    Urinary retention     Tobacco History: Social History   Tobacco Use  Smoking Status Former   Current packs/day: 0.00   Types: Cigarettes   Quit date: 06/28/1976   Years since quitting: 47.2  Smokeless Tobacco Never   Counseling given: Not Answered   Continue to not smoke  Outpatient Encounter Medications as of 09/06/2023  Medication Sig   albuterol (VENTOLIN HFA) 108 (90 Base) MCG/ACT inhaler Inhale 2 puffs into the lungs every 6 (six) hours as needed.   azithromycin (ZITHROMAX Z-PAK) 250 MG tablet Take 2 tablets (500 mg) PO today, then 1 tablet (250 mg) PO daily x4 days.   Cyanocobalamin (VITAMIN B-12 IJ) Inject 1,000 mcg as directed every 30 (thirty) days.   diazepam (VALIUM) 10 MG tablet Take 10 mg by mouth once as needed for anxiety (once if needed for dental procedures).   fluocinonide-emollient (LIDEX-E) 0.05 % cream Apply 1 Application topically 2 (two) times daily.   hydrALAZINE (APRESOLINE) 50 MG tablet Take 1 tablet (50 mg total) by mouth 3 (three) times daily.   rosuvastatin (CRESTOR) 10 MG tablet TAKE ONE TABLET BY MOUTH DAILY   Facility-Administered Encounter Medications as of 09/06/2023  Medication   cyanocobalamin (VITAMIN B12) injection 1,000 mcg     Review of Systems  Review of Systems  N/a Physical Exam  BP 137/87 (BP Location: Left Arm, Patient Position: Sitting, Cuff Size: Normal)   Pulse (!) 122   Temp 98.1 F (36.7 C) (Temporal)   Ht 5\' 8"  (1.727 m)   Wt 171 lb 9.6 oz  (77.8 kg)   SpO2 97%   BMI 26.09 kg/m   Wt Readings from Last 5 Encounters:  09/06/23 171 lb 9.6 oz (77.8 kg)  07/24/23 170 lb (77.1 kg)  05/01/23 172 lb 3.2 oz (78.1 kg)  03/01/23 172 lb (78 kg)  02/07/23 169 lb (76.7 kg)    BMI Readings from Last 5 Encounters:  09/06/23 26.09 kg/m  07/24/23 25.85 kg/m  05/01/23 26.18 kg/m  03/01/23 26.15 kg/m  02/07/23 25.70 kg/m  Physical Exam General: Sitting in chair, no acute distress Eyes: EOMI, no icterus Neck: Supple, no JVP Pulmonary: Normal work of breathing, on room air.  Diminished on the right laterally Cardiovascular: Warm, no edema Abdomen: Nondistended bowel is present MSK: No synovitis, no joint effusion Neuro: Normal gait, no weakness Psych: Normal mood, full affect   Assessment & Plan:   Loculated pleural effusion: On the right.  Loculated.  Initial studies seem consistent with hemothorax with no nucleated cells exudative.  Bloody appearance.  Unclear why this recurs other than likely longstanding inflammation of the segment to prior hemothorax.  Discussed chest tube placement and pleurodesis.  Will need to consider this strongly at next occurrence.  Keep her for another thoracentesis at this time to see if it will recur again.  New orders placed today.  CBC today to evaluate possible anemia given his description of fatigue dyspnea etc.  Return in about 4 weeks (around 10/04/2023) for f/u Dr. Judeth Horn.   Karren Burly, MD 09/06/2023   This appointment required 41 minutes of patient care (this includes precharting, chart review, review of results, face-to-face care, etc.).

## 2023-09-06 NOTE — Patient Instructions (Addendum)
 Nice to see you again  Blood count today, lab work to evaluate for some your symptoms  The prior fluid looked bloody to me so I am worried that at 1 point in time there was a bleed there, why this has come back I do not know.  My suspicion is there is ongoing inflammation in that area related to possibly a bleed in the past and fluid is re accumulating.  I will work on coordinating a repeat thoracentesis to get the fluid off   Return to clinic in 1 month or sooner if needed with Dr. Judeth Horn

## 2023-09-07 ENCOUNTER — Other Ambulatory Visit (HOSPITAL_COMMUNITY): Payer: Self-pay

## 2023-09-07 ENCOUNTER — Ambulatory Visit (HOSPITAL_COMMUNITY)
Admission: RE | Admit: 2023-09-07 | Discharge: 2023-09-07 | Disposition: A | Discharge status: Home / Self Care | Source: Ambulatory Visit | Attending: Pulmonary Disease | Admitting: Pulmonary Disease

## 2023-09-07 ENCOUNTER — Ambulatory Visit (HOSPITAL_COMMUNITY)
Admission: RE | Admit: 2023-09-07 | Discharge: 2023-09-07 | Disposition: A | Discharge status: Home / Self Care | Source: Ambulatory Visit

## 2023-09-07 ENCOUNTER — Encounter (HOSPITAL_COMMUNITY): Payer: Self-pay | Admitting: Interventional Radiology

## 2023-09-07 DIAGNOSIS — J9811 Atelectasis: Secondary | ICD-10-CM | POA: Diagnosis not present

## 2023-09-07 DIAGNOSIS — J90 Pleural effusion, not elsewhere classified: Secondary | ICD-10-CM | POA: Diagnosis not present

## 2023-09-07 DIAGNOSIS — J942 Hemothorax: Secondary | ICD-10-CM | POA: Insufficient documentation

## 2023-09-07 DIAGNOSIS — R0609 Other forms of dyspnea: Secondary | ICD-10-CM | POA: Insufficient documentation

## 2023-09-07 DIAGNOSIS — R091 Pleurisy: Secondary | ICD-10-CM | POA: Insufficient documentation

## 2023-09-07 DIAGNOSIS — J948 Other specified pleural conditions: Secondary | ICD-10-CM | POA: Diagnosis not present

## 2023-09-07 DIAGNOSIS — Z48813 Encounter for surgical aftercare following surgery on the respiratory system: Secondary | ICD-10-CM | POA: Diagnosis not present

## 2023-09-07 HISTORY — PX: IR THORACENTESIS RIGHT ASP PLEURAL SPACE W/IMG GUIDE: IMG5380

## 2023-09-07 LAB — BODY FLUID CELL COUNT WITH DIFFERENTIAL
Eos, Fluid: 1 %
Lymphs, Fluid: 81 %
Monocyte-Macrophage-Serous Fluid: 17 % — ABNORMAL LOW (ref 50–90)
Neutrophil Count, Fluid: 1 % (ref 0–25)
Total Nucleated Cell Count, Fluid: 862 uL (ref 0–1000)

## 2023-09-07 LAB — LACTATE DEHYDROGENASE, PLEURAL OR PERITONEAL FLUID: LD, Fluid: 179 U/L — ABNORMAL HIGH (ref 3–23)

## 2023-09-07 LAB — ALBUMIN, PLEURAL OR PERITONEAL FLUID: Albumin, Fluid: 2.5 g/dL

## 2023-09-07 MED ORDER — LIDOCAINE HCL 1 % IJ SOLN
20.0000 mL | Freq: Once | INTRAMUSCULAR | Status: AC
  Administered 2023-09-07: 10 mL

## 2023-09-07 MED ORDER — LIDOCAINE HCL 1 % IJ SOLN
INTRAMUSCULAR | Status: AC
  Filled 2023-09-07: qty 20

## 2023-09-07 NOTE — Procedures (Signed)
 PROCEDURE SUMMARY:  Successful image-guided diagnostic and therapeutic left thoracentesis. Yielded 1.3 liters of yellow fluid.  No immediate complications.  EBL: zero Patient tolerated well.   Specimen sent for labs. Follow up CXR reviewed by Dr. Elby Showers prior to patient departure.   Please see imaging section of Epic for full dictation.  Audrie Kuri NP 09/07/2023 12:55 PM

## 2023-09-08 LAB — CYTOLOGY - NON PAP

## 2023-09-10 LAB — BODY FLUID CULTURE W GRAM STAIN
Culture: NO GROWTH
Gram Stain: NONE SEEN

## 2023-09-11 LAB — FLOW CYTOMETRY REQUEST - FLUID (INPATIENT)

## 2023-09-12 ENCOUNTER — Telehealth: Payer: Self-pay | Admitting: Pulmonary Disease

## 2023-09-12 ENCOUNTER — Ambulatory Visit (INDEPENDENT_AMBULATORY_CARE_PROVIDER_SITE_OTHER): Admitting: Pulmonary Disease

## 2023-09-12 ENCOUNTER — Ambulatory Visit (INDEPENDENT_AMBULATORY_CARE_PROVIDER_SITE_OTHER)

## 2023-09-12 ENCOUNTER — Encounter: Payer: Self-pay | Admitting: Pulmonary Disease

## 2023-09-12 ENCOUNTER — Ambulatory Visit

## 2023-09-12 VITALS — BP 139/89 | HR 104 | Temp 98.2°F | Ht 68.0 in | Wt 171.5 lb

## 2023-09-12 DIAGNOSIS — R0602 Shortness of breath: Secondary | ICD-10-CM

## 2023-09-12 DIAGNOSIS — J90 Pleural effusion, not elsewhere classified: Secondary | ICD-10-CM

## 2023-09-12 MED ORDER — FUROSEMIDE 20 MG PO TABS: 20.0000 mg | ORAL_TABLET | Freq: Every day | ORAL | 0 refills | Status: DC

## 2023-09-12 NOTE — Telephone Encounter (Signed)
 Margie advised me to send to Triage.

## 2023-09-12 NOTE — Telephone Encounter (Signed)
 Pt was last seen on 09-06-23 by Dr Judeth Horn.  Pt came in office with mild distress and complains of s.o.b. No chest pain, complains of fatigue and can not do day to day normal activities. Pt states he feels like there is still fluid in his lungs. Pt had a Thoracentesis on 09-07-23 and the pt had 1.3 liters of yellow fluid removed. Left side. Pt is asking for help. Kandice Robinsons, Np verbally gave order for STAT cxr for pleural effusion. Order sent.

## 2023-09-12 NOTE — Telephone Encounter (Signed)
 Called and spoke with patient.  Relayed my thoughts on recent chest x-ray after the thoracentesis and today.  BNP was elevated at last visit, worried about decompensated heart failure with now bilateral effusions.  Currently waiting to see Dr. Francine Graven in the clinic.  Briefly discussed may need admission for heart failure workup as well as expedited diuresis.  Discussed he is in good hands with Dr. Francine Graven and will give great recommendations for next steps.

## 2023-09-12 NOTE — Patient Instructions (Signed)
 We will check labs on Friday  Start taking 20mg  of lasix daily  We will schedule you for echocardiogram

## 2023-09-12 NOTE — Progress Notes (Unsigned)
 Synopsis: Referred in *** for ***  Subjective:   PATIENT ID: Samuel Fisher GENDER: male DOB: 08/28/1945, MRN: [id number]   HPI  Chief Complaint  Patient presents with   Acute Visit    Increased SOB- started in the evening 09/11/23. He feels fatigued. CXR done today.     ***  Past Medical History:  Diagnosis Date   Allergy    Arthritis    BPH (benign prostatic hyperplasia) 2010   Brain tumor (HCC)    Hearing problem    Hyperlipidemia    Hypertension    Inner ear dysfunction    Urinary retention      Family History  Problem Relation Age of Onset   Uterine cancer Mother    Rheum arthritis Mother    Hypertension Father    Throat cancer Father        smoker   Arthritis Other    Heart disease Other    Hypertension Other    Stroke Other    Early death Other    Mental illness Other    Heart attack Brother        smoker   Heart attack Maternal Grandfather    Hypertension Brother    Stroke Brother    Diabetes Neg Hx    Hyperlipidemia Neg Hx      Social History   Socioeconomic History   Marital status: Married    Spouse name: Not on file   Number of children: 2   Years of education: Not on file   Highest education level: Bachelor's degree (e.g., BA, AB, BS)  Occupational History   Occupation: retired   Occupation: Retired Electronics engineer  Tobacco Use   Smoking status: Former    Current packs/day: 0.00    Types: Cigarettes    Quit date: 06/28/1976    Years since quitting: 47.2   Smokeless tobacco: Never  Vaping Use   Vaping status: Never Used  Substance and Sexual Activity   Alcohol use: Yes    Alcohol/week: 3.0 standard drinks of alcohol    Types: 3 Cans of beer per week    Comment: 2-3 beers per day and occasional drink on the weekends   Drug use: No   Sexual activity: Yes  Other Topics Concern   Not on file  Social History Narrative   Not on file   Social Drivers of Health   Financial Resource Strain: Low Risk  (02/05/2023)   Overall  Financial Resource Strain (CARDIA)    Difficulty of Paying Living Expenses: Not hard at all  Food Insecurity: No Food Insecurity (02/05/2023)   Hunger Vital Sign    Worried About Running Out of Food in the Last Year: Never true    Ran Out of Food in the Last Year: Never true  Transportation Needs: No Transportation Needs (02/05/2023)   PRAPARE - Administrator, Civil Service (Medical): No    Lack of Transportation (Non-Medical): No  Physical Activity: Insufficiently Active (02/05/2023)   Exercise Vital Sign    Days of Exercise per Week: 3 days    Minutes of Exercise per Session: 30 min  Stress: No Stress Concern Present (02/05/2023)   [employer] of Occupational Health - Occupational Stress Questionnaire    Feeling of Stress : Not at all  Social Connections: Socially Integrated (02/05/2023)   Social Connection and Isolation Panel [NHANES]    Frequency of Communication with Friends and Family: Three times a week    Frequency of Social  Gatherings with Friends and Family: Twice a week    Attends Religious Services: More than 4 times per year    Active Member of [employer] or Organizations: Yes    Attends Banker Meetings: 1 to 4 times per year    Marital Status: Married  Catering manager Violence: Not At Risk (01/14/2022)   Humiliation, Afraid, Rape, and Kick questionnaire    Fear of Current or Ex-Partner: No    Emotionally Abused: No    Physically Abused: No    Sexually Abused: No     Allergies  Allergen Reactions   Bee Venom Anaphylaxis    Allergic to Bee Sting -unknown reaction     Outpatient Medications Prior to Visit  Medication Sig Dispense Refill   albuterol (VENTOLIN HFA) 108 (90 Base) MCG/ACT inhaler Inhale 2 puffs into the lungs every 6 (six) hours as needed. 18 g 0   Cyanocobalamin (VITAMIN B-12 IJ) Inject 1,000 mcg as directed every 30 (thirty) days.     diazepam (VALIUM) 10 MG tablet Take 10 mg by mouth once as needed for anxiety (once if needed  for dental procedures).     fluocinonide-emollient (LIDEX-E) 0.05 % cream Apply 1 Application topically 2 (two) times daily. 120 g 1   hydrALAZINE (APRESOLINE) 50 MG tablet Take 1 tablet (50 mg total) by mouth 3 (three) times daily. 270 tablet 0   rosuvastatin (CRESTOR) 10 MG tablet TAKE ONE TABLET BY MOUTH DAILY 90 tablet 1   azithromycin (ZITHROMAX Z-PAK) 250 MG tablet Take 2 tablets (500 mg) PO today, then 1 tablet (250 mg) PO daily x4 days. 6 tablet 0   Facility-Administered Medications Prior to Visit  Medication Dose Route Frequency Provider Last Rate Last Admin   cyanocobalamin (VITAMIN B12) injection 1,000 mcg  1,000 mcg Intramuscular Q30 days Etta Grandchild, MD   1,000 mcg at 02/15/23 1410    ROS    Objective:   Vitals:   09/12/23 1621 09/12/23 1622  BP:  139/89  Pulse: (!) 104   Temp:  98.2 F (36.8 C)  TempSrc:  Oral  SpO2: 97%   Weight:  171 lb 8 oz (77.8 kg)  Height:  5\' 8"  (1.727 m)     Physical Exam    CBC    Component Value Date/Time   WBC 11.4 (H) 09/06/2023 1201   RBC 4.42 09/06/2023 1201   HGB 12.7 (L) 09/06/2023 1201   HCT 38.1 (L) 09/06/2023 1201   PLT 305.0 09/06/2023 1201   MCV 86.3 09/06/2023 1201   MCH 31.2 08/06/2021 0341   MCHC 33.4 09/06/2023 1201   RDW 16.3 (H) 09/06/2023 1201   LYMPHSABS 1.1 02/07/2023 1549   MONOABS 0.6 02/07/2023 1549   EOSABS 0.2 02/07/2023 1549   BASOSABS 0.0 02/07/2023 1549     Chest imaging:  PFT:     No data to display          Labs:  Path:  Echo:  Heart Catheterization:       Assessment & Plan:   Pleural effusion - Plan: DG Chest 1 View  Shortness of breath - Plan: ECHOCARDIOGRAM COMPLETE  Discussion: ***    Current Outpatient Medications:    albuterol (VENTOLIN HFA) 108 (90 Base) MCG/ACT inhaler, Inhale 2 puffs into the lungs every 6 (six) hours as needed., Disp: 18 g, Rfl: 0   Cyanocobalamin (VITAMIN B-12 IJ), Inject 1,000 mcg as directed every 30 (thirty) days., Disp: ,  Rfl:    diazepam (VALIUM) 10 MG  tablet, Take 10 mg by mouth once as needed for anxiety (once if needed for dental procedures)., Disp: , Rfl:    fluocinonide-emollient (LIDEX-E) 0.05 % cream, Apply 1 Application topically 2 (two) times daily., Disp: 120 g, Rfl: 1   hydrALAZINE (APRESOLINE) 50 MG tablet, Take 1 tablet (50 mg total) by mouth 3 (three) times daily., Disp: 270 tablet, Rfl: 0   rosuvastatin (CRESTOR) 10 MG tablet, TAKE ONE TABLET BY MOUTH DAILY, Disp: 90 tablet, Rfl: 1  Current Facility-Administered Medications:    cyanocobalamin (VITAMIN B12) injection 1,000 mcg, 1,000 mcg, Intramuscular, Q30 days, Etta Grandchild, MD, 1,000 mcg at 02/15/23 1410

## 2023-09-12 NOTE — Telephone Encounter (Signed)
 PT presents to the front. Dr. Rexene Edison referred him to the hospital to remove fluid. They took out 1 1/2 lt . He said he feels like it is all back. Can we advise? Possibly put an order in for it to be drained?

## 2023-09-13 DIAGNOSIS — J90 Pleural effusion, not elsewhere classified: Secondary | ICD-10-CM | POA: Diagnosis not present

## 2023-09-13 LAB — BASIC METABOLIC PANEL
BUN: 12 mg/dL (ref 6–23)
CO2: 26 meq/L (ref 19–32)
Calcium: 9.2 mg/dL (ref 8.4–10.5)
Chloride: 97 meq/L (ref 96–112)
Creatinine, Ser: 0.94 mg/dL (ref 0.40–1.50)
GFR: 79.02 mL/min (ref >60.00)
Glucose, Bld: 127 mg/dL — ABNORMAL HIGH (ref 70–99)
Potassium: 3.7 meq/L (ref 3.5–5.1)
Sodium: 134 meq/L — ABNORMAL LOW (ref 135–145)

## 2023-09-13 LAB — SEDIMENTATION RATE: Sed Rate: 65 mm/h — ABNORMAL HIGH (ref 0–20)

## 2023-09-13 LAB — C-REACTIVE PROTEIN: CRP: 14.1 mg/dL (ref 0.5–20.0)

## 2023-09-13 NOTE — Telephone Encounter (Signed)
 Pt seen Dr. Francine Graven 09/12/23 for acute visit.  Will close encounter.  Nothing further needed.

## 2023-09-14 ENCOUNTER — Encounter: Payer: Self-pay | Admitting: Pulmonary Disease

## 2023-09-14 DIAGNOSIS — J9811 Atelectasis: Secondary | ICD-10-CM | POA: Diagnosis not present

## 2023-09-14 DIAGNOSIS — E8809 Other disorders of plasma-protein metabolism, not elsewhere classified: Secondary | ICD-10-CM | POA: Diagnosis not present

## 2023-09-14 DIAGNOSIS — I491 Atrial premature depolarization: Secondary | ICD-10-CM | POA: Diagnosis not present

## 2023-09-14 DIAGNOSIS — K862 Cyst of pancreas: Secondary | ICD-10-CM | POA: Diagnosis not present

## 2023-09-14 DIAGNOSIS — I471 Supraventricular tachycardia, unspecified: Secondary | ICD-10-CM | POA: Diagnosis not present

## 2023-09-14 DIAGNOSIS — D72829 Elevated white blood cell count, unspecified: Secondary | ICD-10-CM | POA: Diagnosis not present

## 2023-09-14 DIAGNOSIS — L989 Disorder of the skin and subcutaneous tissue, unspecified: Secondary | ICD-10-CM | POA: Diagnosis not present

## 2023-09-14 DIAGNOSIS — R918 Other nonspecific abnormal finding of lung field: Secondary | ICD-10-CM | POA: Diagnosis not present

## 2023-09-14 DIAGNOSIS — J90 Pleural effusion, not elsewhere classified: Secondary | ICD-10-CM | POA: Diagnosis not present

## 2023-09-14 DIAGNOSIS — E785 Hyperlipidemia, unspecified: Secondary | ICD-10-CM | POA: Diagnosis not present

## 2023-09-14 DIAGNOSIS — I517 Cardiomegaly: Secondary | ICD-10-CM | POA: Diagnosis not present

## 2023-09-14 DIAGNOSIS — Z538 Procedure and treatment not carried out for other reasons: Secondary | ICD-10-CM | POA: Diagnosis not present

## 2023-09-14 DIAGNOSIS — R2681 Unsteadiness on feet: Secondary | ICD-10-CM | POA: Diagnosis not present

## 2023-09-14 DIAGNOSIS — I459 Conduction disorder, unspecified: Secondary | ICD-10-CM | POA: Diagnosis not present

## 2023-09-14 DIAGNOSIS — I2510 Atherosclerotic heart disease of native coronary artery without angina pectoris: Secondary | ICD-10-CM | POA: Diagnosis not present

## 2023-09-14 DIAGNOSIS — Z6826 Body mass index (BMI) 26.0-26.9, adult: Secondary | ICD-10-CM | POA: Diagnosis not present

## 2023-09-14 DIAGNOSIS — I77810 Thoracic aortic ectasia: Secondary | ICD-10-CM | POA: Diagnosis not present

## 2023-09-14 DIAGNOSIS — Z4682 Encounter for fitting and adjustment of non-vascular catheter: Secondary | ICD-10-CM | POA: Diagnosis not present

## 2023-09-14 DIAGNOSIS — R4182 Altered mental status, unspecified: Secondary | ICD-10-CM | POA: Diagnosis not present

## 2023-09-14 DIAGNOSIS — J918 Pleural effusion in other conditions classified elsewhere: Secondary | ICD-10-CM | POA: Diagnosis not present

## 2023-09-14 DIAGNOSIS — G510 Bell's palsy: Secondary | ICD-10-CM | POA: Diagnosis not present

## 2023-09-14 DIAGNOSIS — I4891 Unspecified atrial fibrillation: Secondary | ICD-10-CM | POA: Diagnosis not present

## 2023-09-14 DIAGNOSIS — I110 Hypertensive heart disease with heart failure: Secondary | ICD-10-CM | POA: Diagnosis not present

## 2023-09-14 DIAGNOSIS — I5020 Unspecified systolic (congestive) heart failure: Secondary | ICD-10-CM | POA: Diagnosis not present

## 2023-09-14 DIAGNOSIS — J95811 Postprocedural pneumothorax: Secondary | ICD-10-CM | POA: Diagnosis not present

## 2023-09-14 DIAGNOSIS — R410 Disorientation, unspecified: Secondary | ICD-10-CM | POA: Diagnosis not present

## 2023-09-14 DIAGNOSIS — Z79899 Other long term (current) drug therapy: Secondary | ICD-10-CM | POA: Diagnosis not present

## 2023-09-14 DIAGNOSIS — G9782 Other postprocedural complications and disorders of nervous system: Secondary | ICD-10-CM | POA: Diagnosis not present

## 2023-09-14 DIAGNOSIS — R079 Chest pain, unspecified: Secondary | ICD-10-CM | POA: Diagnosis not present

## 2023-09-14 DIAGNOSIS — J948 Other specified pleural conditions: Secondary | ICD-10-CM | POA: Diagnosis not present

## 2023-09-14 DIAGNOSIS — D509 Iron deficiency anemia, unspecified: Secondary | ICD-10-CM | POA: Diagnosis not present

## 2023-09-14 DIAGNOSIS — E222 Syndrome of inappropriate secretion of antidiuretic hormone: Secondary | ICD-10-CM | POA: Diagnosis not present

## 2023-09-14 DIAGNOSIS — I10 Essential (primary) hypertension: Secondary | ICD-10-CM | POA: Diagnosis not present

## 2023-09-14 DIAGNOSIS — R634 Abnormal weight loss: Secondary | ICD-10-CM | POA: Diagnosis not present

## 2023-09-14 DIAGNOSIS — H9191 Unspecified hearing loss, right ear: Secondary | ICD-10-CM | POA: Diagnosis not present

## 2023-09-14 LAB — TSH: TSH: 2.19 (ref 0.41–5.90)

## 2023-09-14 NOTE — Progress Notes (Signed)
 Thoracentesis  Procedure Note  Samuel Fisher  [id number]  04-21-1946  Date:09/14/23  Time:11:17 AM   Provider Performing:Ames Hoban B Susumu Hackler   Procedure: Thoracentesis with imaging guidance (40981)  Indication(s) Pleural Effusion  Consent Risks of the procedure as well as the alternatives and risks of each were explained to the patient and/or caregiver.  Consent for the procedure was obtained and is signed in the bedside chart  Anesthesia Topical only with 1% lidocaine    Time Out Verified patient identification, verified procedure, site/side was marked, verified correct patient position, special equipment/implants available, medications/allergies/relevant history reviewed, required imaging and test results available.   Sterile Technique Maximal sterile technique including full sterile barrier drape, hand hygiene, sterile gown, sterile gloves, mask, hair covering, sterile ultrasound probe cover (if used).  Procedure Description Ultrasound was used to identify appropriate pleural anatomy for placement and overlying skin marked.  Area of drainage cleaned and draped in sterile fashion. Lidocaine was used to anesthetize the skin and subcutaneous tissue.  1000 cc's of clear/straw colored appearing fluid was drained from the left pleural space. Catheter then removed and bandaid applied to site.   Complications/Tolerance None; patient tolerated the procedure well. Chest X-ray is ordered to confirm no post-procedural complication.   EBL Minimal   Specimen(s) None

## 2023-09-15 LAB — ANA: Anti Nuclear Antibody (ANA): POSITIVE — AB

## 2023-09-15 LAB — ANTI-NUCLEAR AB-TITER (ANA TITER): ANA Titer 1: 1:320 {titer} — ABNORMAL HIGH

## 2023-09-15 LAB — ANTI-SMITH ANTIBODY: ENA SM Ab Ser-aCnc: <1 AI

## 2023-09-15 LAB — RHEUMATOID FACTOR: Rheumatoid fact SerPl-aCnc: 18 [IU]/mL — ABNORMAL HIGH (ref <14)

## 2023-09-15 LAB — CYCLIC CITRUL PEPTIDE ANTIBODY, IGG: Cyclic Citrullin Peptide Ab: <16 U

## 2023-09-22 ENCOUNTER — Telehealth: Payer: Self-pay | Admitting: *Deleted

## 2023-09-22 NOTE — Transitions of Care (Post Inpatient/ED Visit) (Signed)
 09/22/2023  Name: Samuel Fisher MRN: [id number] DOB: 1946-03-26  Today's TOC FU Call Status: Today's TOC FU Call Status:: Successful TOC FU Call Completed TOC FU Call Complete Date: 09/22/23 Patient's Name and Date of Birth confirmed.  Transition Care Management Follow-up Telephone Call Date of Discharge: 09/21/23 Discharge Facility: Other ([facility]) Name of Other ([facility]) Discharge Facility: [facility] Type of Discharge: Inpatient Admission Primary Inpatient Discharge Diagnosis:: SVT; Recurrent (R) pleural effusion with thoracentesis How have you been since you were released from the hospital?: Better ("I am fine- doing great; going out to do a little walking at my gym--- just going to go light; I am independent and my wife manages my medications, because she doesn't want me to get anything wrong- she likes to be in control of me" (laugh)) Any questions or concerns?: No  Items Reviewed: Did you receive and understand the discharge instructions provided?: Yes (briefly reviewed with patient who verbalizes good understanding of same - outside hospital AVS) Medications obtained,verified, and reconciled?: No (Confirmed patient has no questions/ concerns around medications today) Medications Not Reviewed Reasons:: Other: (patient declined- states wife manages medications and she is out picking up medications now from outpatient pharmacy: reports "we went over everything thoroughly before we left the hospital; my daughter works at [employer], no questions or concerns") Any new allergies since your discharge?: No Dietary orders reviewed?: Yes Type of Diet Ordered:: "Regular" Do you have support at home?: Yes People in Home: spouse Name of Support/Comfort Primary Source: Reports independent in self-care activities; supportive spouse assists as/ if needed/ indicated  Medications Reviewed Today: Medications Reviewed Today     Reviewed by Michaela Corner, RN (Registered Nurse) on  09/22/23 at 1053  Med List Status: <None>   Medication Order Taking? Sig Documenting Provider Last Dose Status Informant  albuterol (VENTOLIN HFA) 108 (90 Base) MCG/ACT inhaler [id number] No Inhale 2 puffs into the lungs every 6 (six) hours as needed. Etta Grandchild, MD Taking Active            Med Note Michaela Corner   Fri Sep 22, 2023 10:53 AM) 09/22/23: Declines medication review during TOC call today  Cyanocobalamin (VITAMIN B-12 IJ) [id number] No Inject 1,000 mcg as directed every 30 (thirty) days. [provider] Taking Active Spouse/Significant Other           Med Note Richardean Canal Jul 22, 2021  3:11 PM)    cyanocobalamin (VITAMIN B12) injection 1,000 mcg [id number]   Etta Grandchild, MD  Active   diazepam (VALIUM) 10 MG tablet [id number] No Take 10 mg by mouth once as needed for anxiety (once if needed for dental procedures). [provider] Taking Active   fluocinonide-emollient (LIDEX-E) 0.05 % cream [id number] No Apply 1 Application topically 2 (two) times daily. Etta Grandchild, MD Taking Active   furosemide (LASIX) 20 MG tablet [id number]  Take 1 tablet (20 mg total) by mouth daily. Martina Sinner, MD  Active   hydrALAZINE (APRESOLINE) 50 MG tablet [id number] No Take 1 tablet (50 mg total) by mouth 3 (three) times daily. Etta Grandchild, MD Taking Active   rosuvastatin (CRESTOR) 10 MG tablet [id number] No TAKE ONE TABLET BY MOUTH DAILY Etta Grandchild, MD Taking Active            Home Care and Equipment/Supplies: Were Home Health Services Ordered?: No Any new equipment or medical supplies ordered?: No  Functional Questionnaire: Do you need assistance with  bathing/showering or dressing?: No Do you need assistance with meal preparation?: No Do you need assistance with eating?: No Do you have difficulty maintaining continence: No Do you need assistance with getting out of bed/getting out of a chair/moving?: No Do you have difficulty managing or  taking your medications?: Yes (spouse manages medications)  Follow up appointments reviewed: PCP Follow-up appointment confirmed?: Yes Date of PCP follow-up appointment?: 09/28/23 Follow-up Provider: PCP- covering provider: Dr. Alvy Bimler Specialist [facility] Follow-up appointment confirmed?: Yes Date of Specialist follow-up appointment?:  ("Next week--- it is already scheduled and on my calendar- I just don't have my calendar in front of me") Follow-Up Specialty Provider:: "The doctor at [facility]" Do you need transportation to your follow-up appointment?: No Do you understand care options if your condition(s) worsen?: Yes-patient verbalized understanding  SDOH Interventions Today    Flowsheet Row Most Recent Value  SDOH Interventions   Food Insecurity Interventions Intervention Not Indicated  Housing Interventions Intervention Not Indicated  Transportation Interventions Intervention Not Indicated  [drives self]  Utilities Interventions Intervention Not Indicated      Interventions Today    Flowsheet Row Most Recent Value  Chronic Disease   Chronic disease during today's visit Other  [SVT,  recurrent pleural effusion]  General Interventions   General Interventions Discussed/Reviewed Durable Medical Equipment (DME), Doctor Visits, General Interventions Discussed  Doctor Visits Discussed/Reviewed Doctor Visits Discussed, PCP, Specialist  Durable Medical Equipment (DME) Other  [confirmed not currently requiring/ using assistive devices for ambulation: confirms has cane/ walker for prn use]  PCP/Specialist Visits Compliance with follow-up visit  Exercise Interventions   Exercise Discussed/Reviewed Exercise Discussed  Education Interventions   Education Provided Provided Education  Provided Verbal Education On Other, When to see the doctor  [Provided education around benefit of conservative post-hospital discharge activity,  need to pace activity without over-doing]  Nutrition Interventions    Nutrition Discussed/Reviewed Nutrition Discussed  Pharmacy Interventions   Pharmacy Dicussed/Reviewed Pharmacy Topics Discussed  [Declined medication review]      TOC Interventions Today    Flowsheet Row Most Recent Value  TOC Interventions   TOC Interventions Discussed/Reviewed TOC Interventions Discussed  [Patient declines need for ongoing/ further care management outreach,  declines enrollment in 30-day TOC program,  provided my direct contact information should questions/ concerns/ needs arise post-TOC call]      Total time spent from review to signing of note/ including any care coordination interventions:  37 minutes- outside hospital EHR review  Pls call/ message for questions,  Caryl Pina, RN, BSN, Media planner  Transitions of Care  [facility] [phone]: direct office

## 2023-09-28 ENCOUNTER — Ambulatory Visit: Admitting: Emergency Medicine

## 2023-09-28 ENCOUNTER — Encounter: Payer: Self-pay | Admitting: Emergency Medicine

## 2023-09-28 VITALS — BP 138/70 | HR 62 | Temp 98.0°F | Ht 68.0 in | Wt 162.0 lb

## 2023-09-28 DIAGNOSIS — N1831 Chronic kidney disease, stage 3a: Secondary | ICD-10-CM | POA: Diagnosis not present

## 2023-09-28 DIAGNOSIS — I4729 Other ventricular tachycardia: Secondary | ICD-10-CM | POA: Diagnosis not present

## 2023-09-28 DIAGNOSIS — J90 Pleural effusion, not elsewhere classified: Secondary | ICD-10-CM | POA: Diagnosis not present

## 2023-09-28 DIAGNOSIS — K862 Cyst of pancreas: Secondary | ICD-10-CM | POA: Diagnosis not present

## 2023-09-28 DIAGNOSIS — D649 Anemia, unspecified: Secondary | ICD-10-CM

## 2023-09-28 DIAGNOSIS — I10 Essential (primary) hypertension: Secondary | ICD-10-CM

## 2023-09-28 DIAGNOSIS — Z09 Encounter for follow-up examination after completed treatment for conditions other than malignant neoplasm: Secondary | ICD-10-CM

## 2023-09-28 DIAGNOSIS — R1900 Intra-abdominal and pelvic swelling, mass and lump, unspecified site: Secondary | ICD-10-CM

## 2023-09-28 DIAGNOSIS — E871 Hypo-osmolality and hyponatremia: Secondary | ICD-10-CM

## 2023-09-28 LAB — VITAMIN B12: Vitamin B-12: 402 pg/mL (ref 211–911)

## 2023-09-28 LAB — CBC WITH DIFFERENTIAL/PLATELET
Basophils Absolute: 0 10*3/uL (ref 0.0–0.1)
Basophils Relative: 0.3 % (ref 0.0–3.0)
Eosinophils Absolute: 0.3 10*3/uL (ref 0.0–0.7)
Eosinophils Relative: 3 % (ref 0.0–5.0)
HCT: 34.6 % — ABNORMAL LOW (ref 39.0–52.0)
Hemoglobin: 11.3 g/dL — ABNORMAL LOW (ref 13.0–17.0)
Lymphocytes Relative: 10.4 % — ABNORMAL LOW (ref 12.0–46.0)
Lymphs Abs: 1.1 10*3/uL (ref 0.7–4.0)
MCHC: 32.7 g/dL (ref 30.0–36.0)
MCV: 85.1 fl (ref 78.0–100.0)
Monocytes Absolute: 0.9 10*3/uL (ref 0.1–1.0)
Monocytes Relative: 8.1 % (ref 3.0–12.0)
Neutro Abs: 8.2 10*3/uL — ABNORMAL HIGH (ref 1.4–7.7)
Neutrophils Relative %: 78.2 % — ABNORMAL HIGH (ref 43.0–77.0)
Platelets: 325 10*3/uL (ref 150.0–400.0)
RBC: 4.07 Mil/uL — ABNORMAL LOW (ref 4.22–5.81)
RDW: 16.2 % — ABNORMAL HIGH (ref 11.5–15.5)
WBC: 10.5 10*3/uL (ref 4.0–10.5)

## 2023-09-28 LAB — COMPREHENSIVE METABOLIC PANEL WITH GFR
ALT: 16 U/L (ref 0–53)
AST: 17 U/L (ref 0–37)
Albumin: 3.7 g/dL (ref 3.5–5.2)
Alkaline Phosphatase: 63 U/L (ref 39–117)
BUN: 16 mg/dL (ref 6–23)
CO2: 26 meq/L (ref 19–32)
Calcium: 8.9 mg/dL (ref 8.4–10.5)
Chloride: 101 meq/L (ref 96–112)
Creatinine, Ser: 0.97 mg/dL (ref 0.40–1.50)
GFR: 76.07 mL/min (ref >60.00)
Glucose, Bld: 93 mg/dL (ref 70–99)
Potassium: 3.9 meq/L (ref 3.5–5.1)
Sodium: 136 meq/L (ref 135–145)
Total Bilirubin: 0.5 mg/dL (ref 0.2–1.2)
Total Protein: 7.1 g/dL (ref 6.0–8.3)
eGFR: 89

## 2023-09-28 LAB — FERRITIN: Ferritin: 463 ng/mL — ABNORMAL HIGH (ref 22.0–322.0)

## 2023-09-28 LAB — FOLATE: Folate: 13.9 ng/mL (ref >5.9)

## 2023-09-28 NOTE — Assessment & Plan Note (Signed)
 Recommend pancreas MRI Request placed today

## 2023-09-28 NOTE — Assessment & Plan Note (Signed)
 Stable and asymptomatic Increased ferritin on today's labs

## 2023-09-28 NOTE — Assessment & Plan Note (Signed)
 Most likely transudate Congestive heart failure likely etiology Has appointment to follow-up with pulmonary doctor

## 2023-09-28 NOTE — Assessment & Plan Note (Signed)
 Clinically stable.  Resolved.

## 2023-09-28 NOTE — Assessment & Plan Note (Signed)
 BP Readings from Last 3 Encounters:  09/28/23 138/70  09/12/23 139/89  09/07/23 117/89  Well-controlled hypertension Continue losartan 25 mg and metoprolol succinate 100 mg daily Hydralazine recently stopped

## 2023-09-28 NOTE — Progress Notes (Signed)
 Samuel Fisher 78 y.o.   Chief Complaint  Patient presents with   hsopital follow up     Patient here for HFU for supraventricular tachycardia    HISTORY OF PRESENT ILLNESS: This is a 78 y.o. male here for follow-up of hospital discharge Discharged 1 week ago with the following: Admission Diagnoses:  Confusion state [F44.89] SVT (supraventricular tachycardia) (CMS/HHS-HCC) [I47.10] Pleural effusion [J90] Recurrent pleural effusion on right [J90] Chest pain, unspecified type [R07.9]  Discharge Diagnoses:  Principal Problem: Recurrent pleural effusion on right Active Problems: Benign schwannoma Right acoustic neuroma (CMS/HHS-HCC) Essential hypertension, benign Deficiency anemia Anemia, deficiency SVT (supraventricular tachycardia) (CMS/HHS-HCC) Resolved Problems: * No resolved hospital problems. * Primary Diagnosis: Admitted for bilateral pleural effusions   To-Do List (incidental findings, follow-up studies, etc.): Remove chest tube sutures at PCP follow-up on 4/10 Repeat BMP at PCP on 4/10 to ensure stable Na from SIADH; pt can likely liberalize fluid restriction if stable Evaluate for etiologies of iron-deficiency anemia as outpatient with further evaluation/mgmt per PCP Follow up pancreatic lesion with MRI outpatient  Outpatient pulmonology follow up for bilateral pleural effusion monitoring Cardiology follow up for HFmrEF and continued GDMT titration Follow up scalp lesions noted on exam with dermatology referral  Patient has appointments lined up with pulmonary and cardiology doctors.  HPI   Prior to Admission medications   Medication Sig Start Date End Date Taking? Authorizing Provider  albuterol (VENTOLIN HFA) 108 (90 Base) MCG/ACT inhaler Inhale 2 puffs into the lungs every 6 (six) hours as needed. 08/02/23   Etta Grandchild, MD  Cyanocobalamin (VITAMIN B-12 IJ) Inject 1,000 mcg as directed every 30 (thirty) days.    [provider]  diazepam  (VALIUM) 10 MG tablet Take 10 mg by mouth once as needed for anxiety (once if needed for dental procedures). 02/07/23   [provider]  fluocinonide-emollient (LIDEX-E) 0.05 % cream Apply 1 Application topically 2 (two) times daily. 12/30/22   Etta Grandchild, MD  furosemide (LASIX) 20 MG tablet Take 1 tablet (20 mg total) by mouth daily. 09/12/23   Martina Sinner, MD  hydrALAZINE (APRESOLINE) 50 MG tablet Take 1 tablet (50 mg total) by mouth 3 (three) times daily. 07/24/23   Etta Grandchild, MD  rosuvastatin (CRESTOR) 10 MG tablet TAKE ONE TABLET BY MOUTH DAILY 02/21/23   Etta Grandchild, MD    Allergies  Allergen Reactions   Bee Venom Anaphylaxis    Allergic to Bee Sting -unknown reaction    Patient Active Problem List   Diagnosis Date Noted   Multiple excoriations 02/07/2023   Eczema, allergic 11/23/2022   Allergic contact dermatitis due to drugs in contact with skin 11/23/2022   NSVT (nonsustained ventricular tachycardia) (HCC) 11/23/2022   Stage 3a chronic kidney disease (HCC) 08/24/2022   Irregularly irregular heart rhythm 08/23/2022   Obstructive uropathy 06/18/2021   Hyperlipidemia LDL goal <70 04/22/2021   Bilateral sensorineural hearing loss 10/16/2020   CAD in native artery 06/10/2019   Cystic mass of pancreas 06/06/2019   Chronic hyponatremia 05/28/2019   Brow ptosis, right 07/03/2018   Paralytic lagophthalmos of right upper eyelid 07/03/2018   Vitamin D deficiency 01/15/2018   B12 deficiency anemia 07/15/2016   Benign schwannoma 07/14/2016   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   Right acoustic neuroma (HCC) 01/05/2016   Osteoarthritis of hip 01/28/2013   Essential hypertension, benign 06/28/2012   BPH (benign prostatic hyperplasia) 06/28/2012   Screening for colon cancer 06/28/2012    Past Medical  History:  Diagnosis Date   Allergy    Arthritis    BPH (benign prostatic hyperplasia) 2010   Brain tumor (HCC)    Hearing problem    Hyperlipidemia     Hypertension    Inner ear dysfunction    Urinary retention     Past Surgical History:  Procedure Laterality Date   BRAIN TUMOR EXCISION     CARDIAC CATHETERIZATION     IR THORACENTESIS ASP PLEURAL SPACE W/IMG GUIDE  09/07/2023   NERVE GRAFT     TONSILLECTOMY  1951   TOTAL HIP ARTHROPLASTY     right-2002 and left-2004, 2010   TRANSURETHRAL RESECTION OF PROSTATE N/A 08/05/2021   Procedure: TRANSURETHRAL RESECTION OF THE PROSTATE (TURP) AND ULTRASOUND GUIDED TRANSRECTAL PROSTATE BIOPSY;  Surgeon: Crist Fat, MD;  Location: [facility];  Service: Urology;  Laterality: N/A;   WISDOM TOOTH EXTRACTION     Bottom two    Social History   Socioeconomic History   Marital status: Married    Spouse name: Not on file   Number of children: 2   Years of education: Not on file   Highest education level: Bachelor's degree (e.g., BA, AB, BS)  Occupational History   Occupation: retired   Occupation: Retired Electronics engineer  Tobacco Use   Smoking status: Former    Current packs/day: 0.00    Types: Cigarettes    Quit date: 06/28/1976    Years since quitting: 47.2   Smokeless tobacco: Never  Vaping Use   Vaping status: Never Used  Substance and Sexual Activity   Alcohol use: Yes    Alcohol/week: 3.0 standard drinks of alcohol    Types: 3 Cans of beer per week    Comment: 2-3 beers per day and occasional drink on the weekends   Drug use: No   Sexual activity: Yes  Other Topics Concern   Not on file  Social History Narrative   Not on file   Social Drivers of Health   Financial Resource Strain: Low Risk  (09/22/2023)   [facility]   Overall Financial Resource Strain (CARDIA)    Difficulty of Paying Living Expenses: Not hard at all  Food Insecurity: No Food Insecurity (09/22/2023)   Hunger Vital Sign    Worried About Running Out of Food in the Last Year: Never true    Ran Out of Food in the Last Year: Never true  Transportation Needs: No  Transportation Needs (09/22/2023)   PRAPARE - Administrator, Civil Service (Medical): No    Lack of Transportation (Non-Medical): No  Physical Activity: Insufficiently Active (02/05/2023)   Exercise Vital Sign    Days of Exercise per Week: 3 days    Minutes of Exercise per Session: 30 min  Stress: No Stress Concern Present (02/05/2023)   [employer] of Occupational Health - Occupational Stress Questionnaire    Feeling of Stress : Not at all  Social Connections: Socially Integrated (02/05/2023)   Social Connection and Isolation Panel [NHANES]    Frequency of Communication with Friends and Family: Three times a week    Frequency of Social Gatherings with Friends and Family: Twice a week    Attends Religious Services: More than 4 times per year    Active Member of [employer] or Organizations: Yes    Attends Banker Meetings: 1 to 4 times per year    Marital Status: Married  Catering manager Violence: Not At Risk (09/22/2023)   Humiliation,  Afraid, Rape, and Kick questionnaire    Fear of Current or Ex-Partner: No    Emotionally Abused: No    Physically Abused: No    Sexually Abused: No    Family History  Problem Relation Age of Onset   Uterine cancer Mother    Rheum arthritis Mother    Hypertension Father    Throat cancer Father        smoker   Arthritis Other    Heart disease Other    Hypertension Other    Stroke Other    Early death Other    Mental illness Other    Heart attack Brother        smoker   Heart attack Maternal Grandfather    Hypertension Brother    Stroke Brother    Diabetes Neg Hx    Hyperlipidemia Neg Hx      Review of Systems  Constitutional: Negative.  Negative for chills and fever.  HENT: Negative.  Negative for congestion and sore throat.   Respiratory: Negative.  Negative for cough and shortness of breath.   Cardiovascular: Negative.  Negative for chest pain and palpitations.  Gastrointestinal:  Negative for abdominal pain,  nausea and vomiting.  Genitourinary:  Negative for dysuria and hematuria.  Skin: Negative.  Negative for rash.  Neurological:  Negative for dizziness and headaches.  All other systems reviewed and are negative.   Today's Vitals   09/28/23 1347  BP: 138/70  Pulse: 62  Temp: 98 F (36.7 C)  TempSrc: Oral  SpO2: 98%  Weight: 162 lb (73.5 kg)  Height: 5\' 8"  (1.727 m)   Body mass index is 24.63 kg/m.   Physical Exam Vitals reviewed.  Constitutional:      Appearance: Normal appearance.  HENT:     Head: Normocephalic.     Mouth/Throat:     Mouth: Mucous membranes are moist.     Pharynx: Oropharynx is clear.  Eyes:     Extraocular Movements: Extraocular movements intact.     Pupils: Pupils are equal, round, and reactive to light.  Cardiovascular:     Rate and Rhythm: Normal rate and regular rhythm.     Pulses: Normal pulses.     Heart sounds: Normal heart sounds.  Pulmonary:     Effort: Pulmonary effort is normal.     Breath sounds: Normal breath sounds.  Abdominal:     Palpations: Abdomen is soft.     Tenderness: There is no abdominal tenderness.  Musculoskeletal:     Cervical back: No tenderness.     Right lower leg: No edema.     Left lower leg: No edema.  Lymphadenopathy:     Cervical: No cervical adenopathy.  Skin:    General: Skin is warm and dry.     Capillary Refill: Capillary refill takes less than 2 seconds.     Comments: 1 suture in place from chest tube insertion site.  Removed  Neurological:     Mental Status: He is alert and oriented to person, place, and time. Mental status is at baseline.     Comments: Residual right facial droop  Psychiatric:        Mood and Affect: Mood normal.        Behavior: Behavior normal.    Results for orders placed or performed in visit on 09/28/23 (from the past 24 hours)  CBC with Differential/Platelet     Status: Abnormal   Collection Time: 09/28/23  2:20 PM  Result Value Ref Range  WBC 10.5 4.0 - 10.5 K/uL    RBC 4.07 (L) 4.22 - 5.81 Mil/uL   Hemoglobin 11.3 (L) 13.0 - 17.0 g/dL   HCT 16.1 (L) 09.6 - 04.5 %   MCV 85.1 78.0 - 100.0 fl   MCHC 32.7 30.0 - 36.0 g/dL   RDW 40.9 (H) 81.1 - 91.4 %   Platelets 325.0 150.0 - 400.0 K/uL   Neutrophils Relative % 78.2 (H) 43.0 - 77.0 %   Lymphocytes Relative 10.4 (L) 12.0 - 46.0 %   Monocytes Relative 8.1 3.0 - 12.0 %   Eosinophils Relative 3.0 0.0 - 5.0 %   Basophils Relative 0.3 0.0 - 3.0 %   Neutro Abs 8.2 (H) 1.4 - 7.7 K/uL   Lymphs Abs 1.1 0.7 - 4.0 K/uL   Monocytes Absolute 0.9 0.1 - 1.0 K/uL   Eosinophils Absolute 0.3 0.0 - 0.7 K/uL   Basophils Absolute 0.0 0.0 - 0.1 K/uL  Comprehensive metabolic panel with GFR     Status: None   Collection Time: 09/28/23  2:20 PM  Result Value Ref Range   Sodium 136 135 - 145 mEq/L   Potassium 3.9 3.5 - 5.1 mEq/L   Chloride 101 96 - 112 mEq/L   CO2 26 19 - 32 mEq/L   Glucose, Bld 93 70 - 99 mg/dL   BUN 16 6 - 23 mg/dL   Creatinine, Ser 7.82 0.40 - 1.50 mg/dL   Total Bilirubin 0.5 0.2 - 1.2 mg/dL   Alkaline Phosphatase 63 39 - 117 U/L   AST 17 0 - 37 U/L   ALT 16 0 - 53 U/L   Total Protein 7.1 6.0 - 8.3 g/dL   Albumin 3.7 3.5 - 5.2 g/dL   GFR 95.62 >13.08 mL/min   Calcium 8.9 8.4 - 10.5 mg/dL  Ferritin     Status: Abnormal   Collection Time: 09/28/23  2:20 PM  Result Value Ref Range   Ferritin 463.0 (H) 22.0 - 322.0 ng/mL  Folate     Status: None   Collection Time: 09/28/23  2:20 PM  Result Value Ref Range   Folate 13.9 >5.9 ng/mL  Vitamin B12     Status: None   Collection Time: 09/28/23  2:20 PM  Result Value Ref Range   Vitamin B-12 402 211 - 911 pg/mL     ASSESSMENT & PLAN: A total of 48 minutes was spent with the patient and counseling/coordination of care regarding preparing for this visit, review of most recent office visit notes, review of most recent hospital discharge summary, review of multiple chronic medical conditions under management, review of most recent blood work results,  review of all medications, prognosis, documentation and need for follow-up.  Problem List Items Addressed This Visit       Cardiovascular and Mediastinum   Essential hypertension, benign   BP Readings from Last 3 Encounters:  09/28/23 138/70  09/12/23 139/89  09/07/23 117/89  Well-controlled hypertension Continue losartan 25 mg and metoprolol succinate 100 mg daily Hydralazine recently stopped       Relevant Medications   metoprolol succinate (TOPROL-XL) 100 MG 24 hr tablet   losartan (COZAAR) 25 MG tablet   Other Relevant Orders   Vitamin B12 (Completed)   Folate (Completed)   Iron and TIBC   Ferritin (Completed)   Comprehensive metabolic panel with GFR (Completed)   CBC with Differential/Platelet (Completed)   NSVT (nonsustained ventricular tachycardia) (HCC)   Clinically stable.  Resolved.      Relevant Medications  metoprolol succinate (TOPROL-XL) 100 MG 24 hr tablet   losartan (COZAAR) 25 MG tablet   Other Relevant Orders   Vitamin B12 (Completed)   Folate (Completed)   Iron and TIBC   Ferritin (Completed)   Comprehensive metabolic panel with GFR (Completed)   CBC with Differential/Platelet (Completed)     Respiratory   Chronic bilateral pleural effusions - Primary   Most likely transudate Congestive heart failure likely etiology Has appointment to follow-up with pulmonary doctor      Relevant Orders   Vitamin B12 (Completed)   Folate (Completed)   Iron and TIBC   Ferritin (Completed)   Comprehensive metabolic panel with GFR (Completed)   CBC with Differential/Platelet (Completed)     Digestive   Cystic mass of pancreas   Recommend pancreas MRI Request placed today      Relevant Orders   Vitamin B12 (Completed)   Folate (Completed)   Iron and TIBC   Ferritin (Completed)   Comprehensive metabolic panel with GFR (Completed)   CBC with Differential/Platelet (Completed)   MR Abdomen W Wo Contrast     Genitourinary   Stage 3a chronic kidney  disease (HCC)   Clinically stable. Advised to stay well-hydrated and avoid NSAIDs      Relevant Orders   Vitamin B12 (Completed)   Folate (Completed)   Iron and TIBC   Ferritin (Completed)   Comprehensive metabolic panel with GFR (Completed)   CBC with Differential/Platelet (Completed)     Other   Chronic hyponatremia   Normal sodium today      Intra-abdominal and pelvic swelling, mass and lump, unspecified site   Relevant Orders   MR Abdomen W Wo Contrast   Chronic anemia   Stable and asymptomatic Increased ferritin on today's labs      Other Visit Diagnoses       Hospital discharge follow-up          Patient Instructions  Health Maintenance After Age 36 After age 76, you are at a higher risk for certain long-term diseases and infections as well as injuries from falls. Falls are a major cause of broken bones and head injuries in people who are older than age 22. Getting regular preventive care can help to keep you healthy and well. Preventive care includes getting regular testing and making lifestyle changes as recommended by your health care provider. Talk with your health care provider about: Which screenings and tests you should have. A screening is a test that checks for a disease when you have no symptoms. A diet and exercise plan that is right for you. What should I know about screenings and tests to prevent falls? Screening and testing are the best ways to find a health problem early. Early diagnosis and treatment give you the best chance of managing medical conditions that are common after age 71. Certain conditions and lifestyle choices may make you more likely to have a fall. Your health care provider may recommend: Regular vision checks. Poor vision and conditions such as cataracts can make you more likely to have a fall. If you wear glasses, make sure to get your prescription updated if your vision changes. Medicine review. Work with your health care provider to  regularly review all of the medicines you are taking, including over-the-counter medicines. Ask your health care provider about any side effects that may make you more likely to have a fall. Tell your health care provider if any medicines that you take make you feel dizzy or sleepy.  Strength and balance checks. Your health care provider may recommend certain tests to check your strength and balance while standing, walking, or changing positions. Foot health exam. Foot pain and numbness, as well as not wearing proper footwear, can make you more likely to have a fall. Screenings, including: Osteoporosis screening. Osteoporosis is a condition that causes the bones to get weaker and break more easily. Blood pressure screening. Blood pressure changes and medicines to control blood pressure can make you feel dizzy. Depression screening. You may be more likely to have a fall if you have a fear of falling, feel depressed, or feel unable to do activities that you used to do. Alcohol use screening. Using too much alcohol can affect your balance and may make you more likely to have a fall. Follow these instructions at home: Lifestyle Do not drink alcohol if: Your health care provider tells you not to drink. If you drink alcohol: Limit how much you have to: 0-1 drink a day for women. 0-2 drinks a day for men. Know how much alcohol is in your drink. In the [place] one drink equals one 12 oz bottle of beer (355 mL), one 5 oz glass of wine (148 mL), or one 1 oz glass of hard liquor (44 mL). Do not use any products that contain nicotine or tobacco. These products include cigarettes, chewing tobacco, and vaping devices, such as e-cigarettes. If you need help quitting, ask your health care provider. Activity  Follow a regular exercise program to stay fit. This will help you maintain your balance. Ask your health care provider what types of exercise are appropriate for you. If you need a cane or walker, use it as  recommended by your health care provider. Wear supportive shoes that have nonskid soles. Safety  Remove any tripping hazards, such as rugs, cords, and clutter. Install safety equipment such as grab bars in bathrooms and safety rails on stairs. Keep rooms and walkways well-lit. General instructions Talk with your health care provider about your risks for falling. Tell your health care provider if: You fall. Be sure to tell your health care provider about all falls, even ones that seem minor. You feel dizzy, tiredness (fatigue), or off-balance. Take over-the-counter and prescription medicines only as told by your health care provider. These include supplements. Eat a healthy diet and maintain a healthy weight. A healthy diet includes low-fat dairy products, low-fat (lean) meats, and fiber from whole grains, beans, and lots of fruits and vegetables. Stay current with your vaccines. Schedule regular health, dental, and eye exams. Summary Having a healthy lifestyle and getting preventive care can help to protect your health and wellness after age 24. Screening and testing are the best way to find a health problem early and help you avoid having a fall. Early diagnosis and treatment give you the best chance for managing medical conditions that are more common for people who are older than age 26. Falls are a major cause of broken bones and head injuries in people who are older than age 22. Take precautions to prevent a fall at home. Work with your health care provider to learn what changes you can make to improve your health and wellness and to prevent falls. This information is not intended to replace advice given to you by your health care provider. Make sure you discuss any questions you have with your health care provider. Document Revised: 10/26/2020 Document Reviewed: 10/26/2020 Elsevier Patient Education  2024 [employer]   Edwina Barth, MD [place]  [facility]

## 2023-09-28 NOTE — Patient Instructions (Signed)
 Health Maintenance After Age 76 After age 4, you are at a higher risk for certain long-term diseases and infections as well as injuries from falls. Falls are a major cause of broken bones and head injuries in people who are older than age 47. Getting regular preventive care can help to keep you healthy and well. Preventive care includes getting regular testing and making lifestyle changes as recommended by your health care provider. Talk with your health care provider about: Which screenings and tests you should have. A screening is a test that checks for a disease when you have no symptoms. A diet and exercise plan that is right for you. What should I know about screenings and tests to prevent falls? Screening and testing are the best ways to find a health problem early. Early diagnosis and treatment give you the best chance of managing medical conditions that are common after age 37. Certain conditions and lifestyle choices may make you more likely to have a fall. Your health care provider may recommend: Regular vision checks. Poor vision and conditions such as cataracts can make you more likely to have a fall. If you wear glasses, make sure to get your prescription updated if your vision changes. Medicine review. Work with your health care provider to regularly review all of the medicines you are taking, including over-the-counter medicines. Ask your health care provider about any side effects that may make you more likely to have a fall. Tell your health care provider if any medicines that you take make you feel dizzy or sleepy. Strength and balance checks. Your health care provider may recommend certain tests to check your strength and balance while standing, walking, or changing positions. Foot health exam. Foot pain and numbness, as well as not wearing proper footwear, can make you more likely to have a fall. Screenings, including: Osteoporosis screening. Osteoporosis is a condition that causes  the bones to get weaker and break more easily. Blood pressure screening. Blood pressure changes and medicines to control blood pressure can make you feel dizzy. Depression screening. You may be more likely to have a fall if you have a fear of falling, feel depressed, or feel unable to do activities that you used to do. Alcohol use screening. Using too much alcohol can affect your balance and may make you more likely to have a fall. Follow these instructions at home: Lifestyle Do not drink alcohol if: Your health care provider tells you not to drink. If you drink alcohol: Limit how much you have to: 0-1 drink a day for women. 0-2 drinks a day for men. Know how much alcohol is in your drink. In the [place] one drink equals one 12 oz bottle of beer (355 mL), one 5 oz glass of wine (148 mL), or one 1 oz glass of hard liquor (44 mL). Do not use any products that contain nicotine or tobacco. These products include cigarettes, chewing tobacco, and vaping devices, such as e-cigarettes. If you need help quitting, ask your health care provider. Activity  Follow a regular exercise program to stay fit. This will help you maintain your balance. Ask your health care provider what types of exercise are appropriate for you. If you need a cane or walker, use it as recommended by your health care provider. Wear supportive shoes that have nonskid soles. Safety  Remove any tripping hazards, such as rugs, cords, and clutter. Install safety equipment such as grab bars in bathrooms and safety rails on stairs. Keep rooms and walkways  well-lit. General instructions Talk with your health care provider about your risks for falling. Tell your health care provider if: You fall. Be sure to tell your health care provider about all falls, even ones that seem minor. You feel dizzy, tiredness (fatigue), or off-balance. Take over-the-counter and prescription medicines only as told by your health care provider. These include  supplements. Eat a healthy diet and maintain a healthy weight. A healthy diet includes low-fat dairy products, low-fat (lean) meats, and fiber from whole grains, beans, and lots of fruits and vegetables. Stay current with your vaccines. Schedule regular health, dental, and eye exams. Summary Having a healthy lifestyle and getting preventive care can help to protect your health and wellness after age 11. Screening and testing are the best way to find a health problem early and help you avoid having a fall. Early diagnosis and treatment give you the best chance for managing medical conditions that are more common for people who are older than age 28. Falls are a major cause of broken bones and head injuries in people who are older than age 48. Take precautions to prevent a fall at home. Work with your health care provider to learn what changes you can make to improve your health and wellness and to prevent falls. This information is not intended to replace advice given to you by your health care provider. Make sure you discuss any questions you have with your health care provider. Document Revised: 10/26/2020 Document Reviewed: 10/26/2020 Elsevier Patient Education  2024 [employer].

## 2023-09-28 NOTE — Assessment & Plan Note (Signed)
Clinically stable. Advised to stay well-hydrated and avoid NSAIDs

## 2023-09-28 NOTE — Assessment & Plan Note (Signed)
 Normal sodium today

## 2023-09-29 LAB — IRON, TOTAL/TOTAL IRON BINDING CAP
%SAT: 15 % — ABNORMAL LOW (ref 20–48)
Iron: 31 ug/dL — ABNORMAL LOW (ref 50–180)
TIBC: 210 ug/dL — ABNORMAL LOW (ref 250–425)

## 2023-10-03 ENCOUNTER — Ambulatory Visit (HOSPITAL_COMMUNITY)

## 2023-10-04 ENCOUNTER — Ambulatory Visit (INDEPENDENT_AMBULATORY_CARE_PROVIDER_SITE_OTHER)

## 2023-10-04 DIAGNOSIS — E538 Deficiency of other specified B group vitamins: Secondary | ICD-10-CM | POA: Diagnosis not present

## 2023-10-04 MED ORDER — CYANOCOBALAMIN 1000 MCG/ML IJ SOLN
1000.0000 ug | Freq: Once | INTRAMUSCULAR | Status: AC
  Administered 2023-10-04: 1000 ug via INTRAMUSCULAR

## 2023-10-04 NOTE — Progress Notes (Signed)
 Patient visits today for their b-12 injection. Patient informed of what they had received and tolerated injection well. Patient notified to reach out to office if needed.

## 2023-10-05 DIAGNOSIS — D485 Neoplasm of uncertain behavior of skin: Secondary | ICD-10-CM | POA: Diagnosis not present

## 2023-10-05 DIAGNOSIS — D492 Neoplasm of unspecified behavior of bone, soft tissue, and skin: Secondary | ICD-10-CM | POA: Diagnosis not present

## 2023-10-12 DIAGNOSIS — C61 Malignant neoplasm of prostate: Secondary | ICD-10-CM | POA: Diagnosis not present

## 2023-10-12 LAB — PSA: PSA: 2.6

## 2023-10-16 ENCOUNTER — Other Ambulatory Visit: Payer: Self-pay | Admitting: Internal Medicine

## 2023-10-18 ENCOUNTER — Ambulatory Visit: Admitting: Pulmonary Disease

## 2023-10-19 ENCOUNTER — Inpatient Hospital Stay: Admitting: Internal Medicine

## 2023-10-19 DIAGNOSIS — C61 Malignant neoplasm of prostate: Secondary | ICD-10-CM | POA: Diagnosis not present

## 2023-10-19 DIAGNOSIS — N400 Enlarged prostate without lower urinary tract symptoms: Secondary | ICD-10-CM | POA: Diagnosis not present

## 2023-10-22 DIAGNOSIS — R040 Epistaxis: Secondary | ICD-10-CM | POA: Diagnosis not present

## 2023-10-23 DIAGNOSIS — I110 Hypertensive heart disease with heart failure: Secondary | ICD-10-CM | POA: Diagnosis not present

## 2023-10-23 DIAGNOSIS — I5020 Unspecified systolic (congestive) heart failure: Secondary | ICD-10-CM | POA: Diagnosis not present

## 2023-10-23 DIAGNOSIS — J90 Pleural effusion, not elsewhere classified: Secondary | ICD-10-CM | POA: Diagnosis not present

## 2023-10-23 DIAGNOSIS — I2510 Atherosclerotic heart disease of native coronary artery without angina pectoris: Secondary | ICD-10-CM | POA: Diagnosis not present

## 2023-10-23 DIAGNOSIS — E785 Hyperlipidemia, unspecified: Secondary | ICD-10-CM | POA: Diagnosis not present

## 2023-10-23 DIAGNOSIS — I5022 Chronic systolic (congestive) heart failure: Secondary | ICD-10-CM | POA: Insufficient documentation

## 2023-10-31 DIAGNOSIS — H524 Presbyopia: Secondary | ICD-10-CM | POA: Diagnosis not present

## 2023-11-15 DIAGNOSIS — J90 Pleural effusion, not elsewhere classified: Secondary | ICD-10-CM | POA: Diagnosis not present

## 2023-11-15 DIAGNOSIS — Z87891 Personal history of nicotine dependence: Secondary | ICD-10-CM | POA: Diagnosis not present

## 2023-11-15 DIAGNOSIS — Z86018 Personal history of other benign neoplasm: Secondary | ICD-10-CM | POA: Diagnosis not present

## 2023-11-15 DIAGNOSIS — Z801 Family history of malignant neoplasm of trachea, bronchus and lung: Secondary | ICD-10-CM | POA: Diagnosis not present

## 2023-11-15 DIAGNOSIS — J9811 Atelectasis: Secondary | ICD-10-CM | POA: Diagnosis not present

## 2023-11-15 DIAGNOSIS — Z8546 Personal history of malignant neoplasm of prostate: Secondary | ICD-10-CM | POA: Diagnosis not present

## 2023-11-15 DIAGNOSIS — Z9079 Acquired absence of other genital organ(s): Secondary | ICD-10-CM | POA: Diagnosis not present

## 2023-11-20 DIAGNOSIS — C44311 Basal cell carcinoma of skin of nose: Secondary | ICD-10-CM | POA: Diagnosis not present

## 2023-11-30 ENCOUNTER — Encounter: Payer: Self-pay | Admitting: Internal Medicine

## 2023-11-30 ENCOUNTER — Ambulatory Visit: Admitting: Internal Medicine

## 2023-11-30 VITALS — BP 124/76 | HR 70 | Temp 98.2°F | Resp 16 | Ht 68.0 in | Wt 159.6 lb

## 2023-11-30 DIAGNOSIS — E785 Hyperlipidemia, unspecified: Secondary | ICD-10-CM

## 2023-11-30 DIAGNOSIS — Z0000 Encounter for general adult medical examination without abnormal findings: Secondary | ICD-10-CM | POA: Diagnosis not present

## 2023-11-30 DIAGNOSIS — J90 Pleural effusion, not elsewhere classified: Secondary | ICD-10-CM

## 2023-11-30 DIAGNOSIS — I10 Essential (primary) hypertension: Secondary | ICD-10-CM | POA: Diagnosis not present

## 2023-11-30 DIAGNOSIS — D518 Other vitamin B12 deficiency anemias: Secondary | ICD-10-CM | POA: Diagnosis not present

## 2023-11-30 DIAGNOSIS — I2510 Atherosclerotic heart disease of native coronary artery without angina pectoris: Secondary | ICD-10-CM | POA: Diagnosis not present

## 2023-11-30 DIAGNOSIS — Z0001 Encounter for general adult medical examination with abnormal findings: Secondary | ICD-10-CM | POA: Insufficient documentation

## 2023-11-30 DIAGNOSIS — I5022 Chronic systolic (congestive) heart failure: Secondary | ICD-10-CM

## 2023-11-30 DIAGNOSIS — I480 Paroxysmal atrial fibrillation: Secondary | ICD-10-CM

## 2023-11-30 MED ORDER — CYANOCOBALAMIN 1000 MCG/ML IJ SOLN
1000.0000 ug | Freq: Once | INTRAMUSCULAR | Status: AC
  Administered 2023-11-30: 1000 ug via INTRAMUSCULAR

## 2023-11-30 MED ORDER — ROSUVASTATIN CALCIUM 10 MG PO TABS: 10.0000 mg | ORAL_TABLET | Freq: Every day | ORAL | 1 refills | Status: DC

## 2023-11-30 NOTE — Patient Instructions (Signed)
 Health Maintenance, Male  Adopting a healthy lifestyle and getting preventive care are important in promoting health and wellness. Ask your health care provider about:  The right schedule for you to have regular tests and exams.  Things you can do on your own to prevent diseases and keep yourself healthy.  What should I know about diet, weight, and exercise?  Eat a healthy diet    Eat a diet that includes plenty of vegetables, fruits, low-fat dairy products, and lean protein.  Do not eat a lot of foods that are high in solid fats, added sugars, or sodium.  Maintain a healthy weight  Body mass index (BMI) is a measurement that can be used to identify possible weight problems. It estimates body fat based on height and weight. Your health care provider can help determine your BMI and help you achieve or maintain a healthy weight.  Get regular exercise  Get regular exercise. This is one of the most important things you can do for your health. Most adults should:  Exercise for at least 150 minutes each week. The exercise should increase your heart rate and make you sweat (moderate-intensity exercise).  Do strengthening exercises at least twice a week. This is in addition to the moderate-intensity exercise.  Spend less time sitting. Even light physical activity can be beneficial.  Watch cholesterol and blood lipids  Have your blood tested for lipids and cholesterol at 76 years of age, then have this test every 5 years.  You may need to have your cholesterol levels checked more often if:  Your lipid or cholesterol levels are high.  You are older than 76 years of age.  You are at high risk for heart disease.  What should I know about cancer screening?  Many types of cancers can be detected early and may often be prevented. Depending on your health history and family history, you may need to have cancer screening at various ages. This may include screening for:  Colorectal cancer.  Prostate cancer.  Skin cancer.  Lung  cancer.  What should I know about heart disease, diabetes, and high blood pressure?  Blood pressure and heart disease  High blood pressure causes heart disease and increases the risk of stroke. This is more likely to develop in people who have high blood pressure readings or are overweight.  Talk with your health care provider about your target blood pressure readings.  Have your blood pressure checked:  Every 3-5 years if you are 9-95 years of age.  Every year if you are 85 years old or older.  If you are between the ages of 29 and 29 and are a current or former smoker, ask your health care provider if you should have a one-time screening for abdominal aortic aneurysm (AAA).  Diabetes  Have regular diabetes screenings. This checks your fasting blood sugar level. Have the screening done:  Once every three years after age 23 if you are at a normal weight and have a low risk for diabetes.  More often and at a younger age if you are overweight or have a high risk for diabetes.  What should I know about preventing infection?  Hepatitis B  If you have a higher risk for hepatitis B, you should be screened for this virus. Talk with your health care provider to find out if you are at risk for hepatitis B infection.  Hepatitis C  Blood testing is recommended for:  Everyone born from [birth year] through 1963.  Anyone  with known risk factors for hepatitis C.  Sexually transmitted infections (STIs)  You should be screened each year for STIs, including gonorrhea and chlamydia, if:  You are sexually active and are younger than 76 years of age.  You are older than 76 years of age and your health care provider tells you that you are at risk for this type of infection.  Your sexual activity has changed since you were last screened, and you are at increased risk for chlamydia or gonorrhea. Ask your health care provider if you are at risk.  Ask your health care provider about whether you are at high risk for HIV. Your health care provider  may recommend a prescription medicine to help prevent HIV infection. If you choose to take medicine to prevent HIV, you should first get tested for HIV. You should then be tested every 3 months for as long as you are taking the medicine.  Follow these instructions at home:  Alcohol use  Do not drink alcohol if your health care provider tells you not to drink.  If you drink alcohol:  Limit how much you have to 0-2 drinks a day.  Know how much alcohol is in your drink. In the [place] one drink equals one 12 oz bottle of beer (355 mL), one 5 oz glass of wine (148 mL), or one 1 oz glass of hard liquor (44 mL).  Lifestyle  Do not use any products that contain nicotine or tobacco. These products include cigarettes, chewing tobacco, and vaping devices, such as e-cigarettes. If you need help quitting, ask your health care provider.  Do not use street drugs.  Do not share needles.  Ask your health care provider for help if you need support or information about quitting drugs.  General instructions  Schedule regular health, dental, and eye exams.  Stay current with your vaccines.  Tell your health care provider if:  You often feel depressed.  You have ever been abused or do not feel safe at home.  Summary  Adopting a healthy lifestyle and getting preventive care are important in promoting health and wellness.  Follow your health care provider's instructions about healthy diet, exercising, and getting tested or screened for diseases.  Follow your health care provider's instructions on monitoring your cholesterol and blood pressure.  This information is not intended to replace advice given to you by your health care provider. Make sure you discuss any questions you have with your health care provider.  Document Revised: 10/26/2020 Document Reviewed: 10/26/2020  Elsevier Patient Education  2024 [employer].

## 2023-11-30 NOTE — Progress Notes (Signed)
 Subjective:  Patient ID: Samuel Fisher, male    DOB: Jun 11, 1946  Age: 78 y.o. MRN: [id number]  CC: Annual Exam, Hypertension, Coronary Artery Disease, and Anemia   HPI CHADLEY DZIEDZIC presents for a CPX and f/up ----  Discussed the use of AI scribe software for clinical note transcription with the patient, who gave verbal consent to proceed.  History of Present Illness   Samuel Fisher is a 78 year old male who presents with persistent fatigue and follow-up on previous lung effusion treatment.  He has a history of lung effusion identified a couple of months ago, initially presenting with severe fatigue and dyspnea, which hindered daily activities such as dressing. He underwent an x-ray and was evaluated by a pulmonologist.  He underwent thoracentesis twice, with a total of 2.5 liters of fluid removed from his lungs. Despite these interventions, the cause of the effusion remains undetermined. He was hospitalized for seven days for further evaluation, during which a drain was placed for three days. The drain has since been removed, and there is no current drainage.  No current symptoms of cough, chest pain, or dyspnea, but he continues to experience significant fatigue. He reports a weight loss of 20 pounds, now weighing just under 160 pounds, down from his usual 180 pounds.  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  He mentions a change in his heart medication to atenolol due to an episode if A fib with RVR, attributed to his heart's effort to compensate for lung issues.  No swelling in his legs or feet and no abdominal pain. He is scheduled for an MRI of his lower abdomen due to a palpable finding during a previous examination.       Outpatient Medications Prior to Visit  Medication Sig Dispense Refill   albuterol  (VENTOLIN  HFA) 108 (90 Base)  MCG/ACT inhaler Inhale 2 puffs into the lungs every 6 (six) hours as needed. 18 g 0   Cyanocobalamin  (VITAMIN B-12 IJ) Inject 1,000 mcg as directed every 30 (thirty) days.     diazepam (VALIUM) 10 MG tablet Take 10 mg by mouth once as needed for anxiety (once if needed for dental procedures).     fluocinonide -emollient (LIDEX -E) 0.05 % cream Apply 1 Application topically 2 (two) times daily. 120 g 1   White Petrolatum-Mineral Oil (ARTIFICIAL TEARS) 83-15 % OINT Apply 1 Application to eye.     furosemide  (LASIX ) 20 MG tablet Take 1 tablet (20 mg total) by mouth daily. 30 tablet 0   hydrALAZINE  (APRESOLINE ) 50 MG tablet Take 1 tablet (50 mg total) by mouth 3 (three) times daily. 270 tablet 0   losartan (COZAAR) 25 MG tablet TAKE ONE TABLET BY MOUTH ONCE DAILY 30 tablet 0   metoprolol succinate (TOPROL-XL) 100 MG 24 hr tablet TAKE ONE TABLET BY MOUTH ONCE DAILY 30 tablet 0   rosuvastatin  (CRESTOR ) 10 MG tablet TAKE ONE TABLET BY MOUTH DAILY 90 tablet 1   Facility-Administered Medications Prior to Visit  Medication Dose Route Frequency Provider Last Rate Last Admin   cyanocobalamin  (VITAMIN B12) injection 1,000 mcg  1,000 mcg Intramuscular Q30 days Arcadio Knuckles, MD   1,000 mcg at 02/15/23 1410    ROS Review of Systems  Constitutional:  Positive for unexpected weight change (wt loss). Negative for appetite change, chills, diaphoresis and fatigue.  HENT:  Negative for facial swelling.  Respiratory: Negative.  Negative for cough, chest tightness, shortness of breath and wheezing.   Cardiovascular:  Negative for chest pain, palpitations and leg swelling.  Gastrointestinal: Negative.  Negative for abdominal pain, constipation, diarrhea, nausea and vomiting.  Genitourinary: Negative.  Negative for difficulty urinating and dysuria.  Musculoskeletal: Negative.  Negative for myalgias.  Skin: Negative.   Neurological:  Negative for dizziness, weakness and headaches.  Hematological:  Negative for  adenopathy. Does not bruise/bleed easily.  Psychiatric/Behavioral: Negative.      Objective:  BP 124/76 (BP Location: Left Arm, Patient Position: Sitting, Cuff Size: Normal)   Pulse 70   Temp 98.2 F (36.8 C) (Oral)   Resp 16   Ht 5' 8 (1.727 m)   Wt 159 lb 9.6 oz (72.4 kg)   SpO2 98%   BMI 24.27 kg/m   BP Readings from Last 3 Encounters:  11/30/23 124/76  09/28/23 138/70  09/12/23 139/89    Wt Readings from Last 3 Encounters:  11/30/23 159 lb 9.6 oz (72.4 kg)  09/28/23 162 lb (73.5 kg)  09/12/23 171 lb 8 oz (77.8 kg)    Physical Exam Constitutional:      General: He is not in acute distress.    Appearance: Normal appearance. He is not toxic-appearing or diaphoretic.  HENT:     Nose: Nose normal.     Mouth/Throat:     Mouth: Mucous membranes are moist.   Eyes:     General: No scleral icterus.    Conjunctiva/sclera: Conjunctivae normal.    Cardiovascular:     Rate and Rhythm: Normal rate and regular rhythm.     Heart sounds: No murmur heard.    No friction rub. No gallop.  Pulmonary:     Effort: Pulmonary effort is normal.     Breath sounds: No stridor. No wheezing, rhonchi or rales.  Abdominal:     General: Abdomen is flat.     Palpations: There is no mass.     Tenderness: There is no abdominal tenderness. There is no guarding.     Hernia: No hernia is present.   Musculoskeletal:        General: Normal range of motion.     Cervical back: Neck supple.     Right lower leg: No edema.     Left lower leg: No edema.  Lymphadenopathy:     Cervical: No cervical adenopathy.   Skin:    General: Skin is warm and dry.     Coloration: Skin is not pale.   Neurological:     General: No focal deficit present.     Mental Status: He is alert.     Cranial Nerves: Cranial nerve deficit present.     Sensory: Sensation is intact.     Motor: Motor function is intact.     Coordination: Coordination is intact.     Gait: Gait is intact.     Deep Tendon Reflexes:  Reflexes normal.   Psychiatric:        Mood and Affect: Mood normal.        Behavior: Behavior normal.     Lab Results  Component Value Date   WBC 10.5 09/28/2023   HGB 11.3 (L) 09/28/2023   HCT 34.6 (L) 09/28/2023   PLT 325.0 09/28/2023   GLUCOSE 93 09/28/2023   CHOL 156 08/23/2022   TRIG 62.0 08/23/2022   HDL 73.90 08/23/2022   LDLCALC 70 08/23/2022   ALT 16 09/28/2023   AST 17 09/28/2023   NA  136 09/28/2023   K 3.9 09/28/2023   CL 101 09/28/2023   CREATININE 0.97 09/28/2023   BUN 16 09/28/2023   CO2 26 09/28/2023   TSH 2.19 09/14/2023   PSA 8.44 12/29/2020   HGBA1C 5.7 01/20/2021    IR THORACENTESIS ASP PLEURAL SPACE W/IMG GUIDE Result Date: 09/07/2023 INDICATION: 78 year old male with a history of CKD and HTN; request for image guided thoracentesis for recurrent left pleural effusion on imaging with shortness of breath. EXAM: ULTRASOUND GUIDED LEFT DIAGNOSTIC AND THERAPEUTIC THORACENTESIS MEDICATIONS: 15 cc 1% lidocaine  COMPLICATIONS: None immediate. PROCEDURE: An ultrasound guided thoracentesis was thoroughly discussed with the patient and questions answered. The benefits, risks, alternatives and complications were also discussed. The patient understands and wishes to proceed with the procedure. Written consent was obtained. Ultrasound was performed to localize and mark an adequate pocket of fluid in the left chest. The area was then prepped and draped in the normal sterile fashion. 1% Lidocaine  was used for local anesthesia. Under ultrasound guidance a 6 Fr Safe-T-Centesis catheter was introduced. Thoracentesis was performed. The catheter was removed and a dressing applied. FINDINGS: A total of approximately 1.3 liters of yellow fluid was removed. Samples were sent to the laboratory as requested by the clinical team. IMPRESSION: Successful ultrasound guided left thoracentesis yielding 1.3 liters of pleural fluid. Performed by Terressa Fess, NP Electronically Signed   By:  Creasie Doctor M.D.   On: 09/07/2023 14:02   DG Chest 1 View Result Date: 09/07/2023 CLINICAL DATA:  Pleural effusions.  Status post thoracentesis. EXAM: CHEST  1 VIEW COMPARISON:  07/24/2023 FINDINGS: Heart size is normal. Moderate to large loculated pleural effusion along the right lateral chest wall shows no significant change Small left pleural effusion left basilar atelectasis new since prior exam. No pneumothorax visualized. IMPRESSION: No pneumothorax visualized. No significant change in moderate to large loculated right pleural effusion. New small left pleural effusion and left basilar atelectasis. Electronically Signed   By: Marlyce Sine M.D.   On: 09/07/2023 09:44    Assessment & Plan:  Essential hypertension, benign- His BP is over controlled. Medications adjusted. -     Metoprolol Succinate ER; Take 1 tablet (100 mg total) by mouth daily.  Dispense: 90 tablet; Refill: 0 -     hydrALAZINE  HCl; Take 1 tablet (25 mg total) by mouth 3 (three) times daily.  Dispense: 270 tablet; Refill: 0  Hyperlipidemia LDL goal <70- LDL goal achieved. Doing well on the statin  -     Rosuvastatin  Calcium ; Take 1 tablet (10 mg total) by mouth daily.  Dispense: 90 tablet; Refill: 1 -     Lipid panel; Future  CAD in native artery -     Rosuvastatin  Calcium ; Take 1 tablet (10 mg total) by mouth daily.  Dispense: 90 tablet; Refill: 1 -     Lipid panel; Future -     Metoprolol Succinate ER; Take 1 tablet (100 mg total) by mouth daily.  Dispense: 90 tablet; Refill: 0  Other vitamin B12 deficiency anemia -     Folate; Future -     Cyanocobalamin   PAF (paroxysmal atrial fibrillation) (HCC)- He has good R/R control.  Chronic bilateral pleural effusions -     DG Chest 2 View; Future  Encounter for general adult medical examination with abnormal findings- Exam completed, labs reviewed, vaccines reviewed, no cancer screenings indicated, pt ed material was given.   Heart failure with mildly reduced ejection  fraction (HCC)- No signs of fluid overload. -  hydrALAZINE  HCl; Take 1 tablet (25 mg total) by mouth 3 (three) times daily.  Dispense: 270 tablet; Refill: 0 -     Torsemide; Take 1 tablet (10 mg total) by mouth daily.  Dispense: 90 tablet; Refill: 0     Follow-up: Return in about 6 months (around 05/31/2024).  Sandra Crouch, MD

## 2023-12-03 MED ORDER — METOPROLOL SUCCINATE ER 100 MG PO TB24
100.0000 mg | ORAL_TABLET | Freq: Every day | ORAL | 0 refills | Status: DC
Start: 2023-12-03 — End: 2024-04-19

## 2023-12-03 MED ORDER — HYDRALAZINE HCL 25 MG PO TABS: 25.0000 mg | ORAL_TABLET | Freq: Three times a day (TID) | ORAL | 0 refills | Status: AC

## 2023-12-03 MED ORDER — TORSEMIDE 10 MG PO TABS: 10.0000 mg | ORAL_TABLET | Freq: Every day | ORAL | 0 refills | Status: AC

## 2023-12-11 ENCOUNTER — Encounter: Payer: Self-pay | Admitting: Emergency Medicine

## 2023-12-12 ENCOUNTER — Ambulatory Visit
Admission: RE | Admit: 2023-12-12 | Discharge: 2023-12-12 | Disposition: A | Discharge status: Home / Self Care | Source: Ambulatory Visit | Attending: Emergency Medicine

## 2023-12-12 DIAGNOSIS — K862 Cyst of pancreas: Secondary | ICD-10-CM

## 2023-12-12 DIAGNOSIS — K5730 Diverticulosis of large intestine without perforation or abscess without bleeding: Secondary | ICD-10-CM | POA: Diagnosis not present

## 2023-12-12 DIAGNOSIS — R1900 Intra-abdominal and pelvic swelling, mass and lump, unspecified site: Secondary | ICD-10-CM

## 2023-12-12 MED ORDER — GADOPICLENOL 0.5 MMOL/ML IV SOLN
7.0000 mL | Freq: Once | INTRAVENOUS | Status: AC | PRN
  Administered 2023-12-12: 7 mL via INTRAVENOUS

## 2024-01-17 ENCOUNTER — Other Ambulatory Visit: Payer: Self-pay | Admitting: Internal Medicine

## 2024-01-22 NOTE — Telephone Encounter (Signed)
 Last OV 11/30/23 Next OV not scheduled  Last refill - discontinued at visit 11/30/23 Essential hypertension, benign- His BP is over controlled. Medications adjusted. -     Metoprolol  Succinate ER; Take 1 tablet (100 mg total) by mouth daily.  Dispense: 90 tablet; Refill: 0 -     hydrALAZINE  HCl; Take 1 tablet (25 mg total) by mouth 3 (three) times daily.  Dispense: 270 tablet; Refill: 0

## 2024-02-05 DIAGNOSIS — K080 Exfoliation of teeth due to systemic causes: Secondary | ICD-10-CM | POA: Diagnosis not present

## 2024-02-29 ENCOUNTER — Encounter: Payer: Self-pay | Admitting: Cardiovascular Disease

## 2024-03-01 ENCOUNTER — Ambulatory Visit (INDEPENDENT_AMBULATORY_CARE_PROVIDER_SITE_OTHER)

## 2024-03-01 VITALS — Ht 68.0 in | Wt 163.0 lb

## 2024-03-01 DIAGNOSIS — Z0000 Encounter for general adult medical examination without abnormal findings: Secondary | ICD-10-CM | POA: Diagnosis not present

## 2024-03-01 NOTE — Progress Notes (Signed)
 Subjective:  Please attest and cosign this visit due to patients primary care provider not being in the office at the time the visit was completed.  (Pt of Samuel Fisher)   Samuel Fisher is a 78 y.o. who presents for a Medicare Wellness preventive visit.  As a reminder, Annual Wellness Visits don't include a physical exam, and some assessments may be limited, especially if this visit is performed virtually. We may recommend an in-person follow-up visit with your provider if needed.  Visit Complete: Virtual I connected with  Samuel Fisher on 03/01/24 by a audio enabled telemedicine application and verified that I am speaking with the correct person using two identifiers.  Patient Location: Home  Provider Location: Office/Clinic  I discussed the limitations of evaluation and management by telemedicine. The patient expressed understanding and agreed to proceed.  Vital Signs: Because this visit was a virtual/telehealth visit, some criteria may be missing or patient reported. Any vitals not documented were not able to be obtained and vitals that have been documented are patient reported.  VideoDeclined- This patient declined Librarian, academic. Therefore the visit was completed with audio only.  Persons Participating in Visit: Patient.  AWV Questionnaire: No: Patient Medicare AWV questionnaire was not completed prior to this visit.  Cardiac Risk Factors include: advanced age (>49men, >76 women);dyslipidemia;hypertension;male gender     Objective:    Today's Vitals   03/01/24 1104  Weight: 163 lb (73.9 kg)  Height: 5' 8 (1.727 m)   Body mass index is 24.78 kg/m.     03/01/2024   11:05 AM 01/14/2022    3:40 PM 08/05/2021    8:04 AM 07/23/2021    8:18 AM 07/10/2021    6:16 AM 06/10/2021    6:00 AM 06/10/2021   12:07 AM  Advanced Directives  Does Patient Have a Medical Advance Directive? Yes Yes Yes Yes No No No  Type of Special educational needs teacher of [place];Living will Living will;Healthcare Power of Attorney Living will;Healthcare Power of [employer] Power of [place];Living will     Does patient want to make changes to medical advance directive?  No - Patient declined No - Patient declined      Copy of Healthcare Power of Attorney in Chart? No - copy requested No - copy requested No - copy requested No - copy requested     Would patient like information on creating a medical advance directive?     No - Patient declined No - Patient declined     Current Medications (verified) Outpatient Encounter Medications as of 03/01/2024  Medication Sig   albuterol  (VENTOLIN  HFA) 108 (90 Base) MCG/ACT inhaler Inhale 2 puffs into the lungs every 6 (six) hours as needed.   Cyanocobalamin  (VITAMIN B-12 IJ) Inject 1,000 mcg as directed every 30 (thirty) days.   diazepam (VALIUM) 10 MG tablet Take 10 mg by mouth once as needed for anxiety (once if needed for dental procedures).   fluocinonide -emollient (LIDEX -E) 0.05 % cream Apply 1 Application topically 2 (two) times daily.   hydrALAZINE  (APRESOLINE ) 25 MG tablet Take 1 tablet (25 mg total) by mouth 3 (three) times daily.   metoprolol  succinate (TOPROL -XL) 100 MG 24 hr tablet Take 1 tablet (100 mg total) by mouth daily.   rosuvastatin  (CRESTOR ) 10 MG tablet Take 1 tablet (10 mg total) by mouth daily.   torsemide  (DEMADEX ) 10 MG tablet Take 1 tablet (10 mg total) by mouth daily.   White Petrolatum-Mineral Oil (ARTIFICIAL  TEARS) 83-15 % OINT Apply 1 Application to eye.   Facility-Administered Encounter Medications as of 03/01/2024  Medication   cyanocobalamin  (VITAMIN B12) injection 1,000 mcg    Allergies (verified) Bee venom   History: Past Medical History:  Diagnosis Date   Allergy    Arthritis    BPH (benign prostatic hyperplasia) 2010   Brain tumor (HCC)    Hearing problem    Hyperlipidemia    Hypertension    Inner ear dysfunction    Urinary retention     Past Surgical History:  Procedure Laterality Date   BRAIN TUMOR EXCISION     CARDIAC CATHETERIZATION     IR THORACENTESIS ASP PLEURAL SPACE W/IMG GUIDE  09/07/2023   NERVE GRAFT     TONSILLECTOMY  1951   TOTAL HIP ARTHROPLASTY     right-2002 and left-2004, 2010   TRANSURETHRAL RESECTION OF PROSTATE N/A 08/05/2021   Procedure: TRANSURETHRAL RESECTION OF THE PROSTATE (TURP) AND ULTRASOUND GUIDED TRANSRECTAL PROSTATE BIOPSY;  Surgeon: Samuel Morene ORN, MD;  Location: [facility];  Service: Urology;  Laterality: N/A;   WISDOM TOOTH EXTRACTION     Bottom two   Family History  Problem Relation Age of Onset   Uterine cancer Mother    Rheum arthritis Mother    Hypertension Father    Throat cancer Father        smoker   Arthritis Other    Heart disease Other    Hypertension Other    Stroke Other    Early death Other    Mental illness Other    Heart attack Brother        smoker   Heart attack Maternal Grandfather    Hypertension Brother    Stroke Brother    Diabetes Neg Hx    Hyperlipidemia Neg Hx    Social History   Socioeconomic History   Marital status: Married    Spouse name: Not on file   Number of children: 2   Years of education: Not on file   Highest education level: Bachelor's degree (e.g., BA, AB, BS)  Occupational History   Occupation: retired   Occupation: Retired Electronics engineer  Tobacco Use   Smoking status: Former    Current packs/day: 0.00    Types: Cigarettes    Quit date: 06/28/1976    Years since quitting: 47.7   Smokeless tobacco: Never  Vaping Use   Vaping status: Never Used  Substance and Sexual Activity   Alcohol use: Yes    Alcohol/week: 3.0 standard drinks of alcohol    Types: 3 Cans of beer per week    Comment: 2-3 beers per day and occasional drink on the weekends   Drug use: No   Sexual activity: Not Currently  Other Topics Concern   Not on file  Social History Narrative   Married    Social Drivers of Health   Financial Resource  Strain: Low Risk  (03/01/2024)   Overall Financial Resource Strain (CARDIA)    Difficulty of Paying Living Expenses: Not hard at all  Food Insecurity: No Food Insecurity (03/01/2024)   Hunger Vital Sign    Worried About Running Out of Food in the Last Year: Never true    Ran Out of Food in the Last Year: Never true  Transportation Needs: No Transportation Needs (03/01/2024)   PRAPARE - Administrator, Civil Service (Medical): No    Lack of Transportation (Non-Medical): No  Physical Activity: Insufficiently Active (03/01/2024)   Exercise Vital  Sign    Days of Exercise per Week: 3 days    Minutes of Exercise per Session: 30 min  Stress: No Stress Concern Present (03/01/2024)   [employer] of Occupational Health - Occupational Stress Questionnaire    Feeling of Stress: Not at all  Social Connections: Socially Integrated (03/01/2024)   Social Connection and Isolation Panel    Frequency of Communication with Friends and Family: Three times a week    Frequency of Social Gatherings with Friends and Family: Twice a week    Attends Religious Services: More than 4 times per year    Active Member of [employer] or Organizations: Yes    Attends Banker Meetings: 1 to 4 times per year    Marital Status: Married    Tobacco Counseling Counseling given: Not Answered    Clinical Intake:  Pre-visit preparation completed: Yes  Pain : No/denies pain     BMI - recorded: 24.78 Nutritional Status: BMI of 19-24  Normal Nutritional Risks: None Diabetes: No  Lab Results  Component Value Date   HGBA1C 5.7 01/20/2021     How often do you need to have someone help you when you read instructions, pamphlets, or other written materials from your doctor or pharmacy?: 1 - Never  Interpreter Needed?: No  Information entered by :: Verdie Saba, CMA   Activities of Daily Living     03/01/2024   11:11 AM  In your present state of health, do you have any difficulty performing  the following activities:  Hearing? 1  Comment wears 1 hearing aid (left) - right side hearing loss  Vision? 0  Difficulty concentrating or making decisions? 0  Walking or climbing stairs? 0  Dressing or bathing? 0  Doing errands, shopping? 0  Preparing Food and eating ? N  Using the Toilet? N  In the past six months, have you accidently leaked urine? N  Do you have problems with loss of bowel control? N  Managing your Medications? N  Managing your Finances? N  Housekeeping or managing your Housekeeping? N    Patient Care Team: Joshua Debby CROME, MD as PCP - General (Internal Medicine) Robinson Idol, MD as Consulting Physician (Ophthalmology)  I have updated your Care Teams any recent Medical Services you may have received from other providers in the past year.     Assessment:   This is a routine wellness examination for Mar.  Hearing/Vision screen Hearing Screening - Comments:: Wears 1 hearing aid (left) - right side hearing loss Vision Screening - Comments:: Wears rx glasses - up to date with routine eye exams with Samuel Hurman Robinson   Goals Addressed               This Visit's Progress     Patient Stated (pt-stated)        Patient stated he plans to continue exercising       Depression Screen     03/01/2024   11:12 AM 09/28/2023    1:58 PM 07/24/2023    2:29 PM 08/23/2022    2:32 PM 01/14/2022    3:47 PM 07/21/2021    1:29 PM 03/17/2020    3:28 PM  PHQ 2/9 Scores  PHQ - 2 Score 0 0 0 0 0 0 0  PHQ- 9 Score 1   0       Fall Risk     03/01/2024   11:11 AM 09/28/2023    1:58 PM 09/12/2023    4:21 PM 09/06/2023  11:32 AM 07/24/2023    2:29 PM  Fall Risk   Falls in the past year? 1 0 1 1 0  Number falls in past yr: 0 0 1 1 0  Comment 1      Injury with Fall? 0 0 0 0 0  Risk for fall due to : Impaired balance/gait No Fall Risks   No Fall Risks  Follow up Falls evaluation completed;Falls prevention discussed Falls evaluation completed   Falls evaluation  completed    MEDICARE RISK AT HOME:  Medicare Risk at Home Any stairs in or around the home?: Yes If so, are there any without handrails?: No Home free of loose throw rugs in walkways, pet beds, electrical cords, etc?: Yes Adequate lighting in your home to reduce risk of falls?: Yes Life alert?: No Use of a cane, walker or w/c?: Yes (cane) Grab bars in the bathroom?: Yes Shower chair or bench in shower?: No Elevated toilet seat or a handicapped toilet?: Yes  TIMED UP AND GO:  Was the test performed?  No  Cognitive Function: 6CIT completed        03/01/2024   11:15 AM 01/14/2022    4:06 PM  6CIT Screen  What Year? 0 points 0 points  What month? 0 points 0 points  What time? 0 points 0 points  Count back from 20 0 points 0 points  Months in reverse 0 points 0 points  Repeat phrase 2 points 0 points  Total Score 2 points 0 points    Immunizations Immunization History  Administered Date(s) Administered   Fluad Quad(high Dose 65+) 03/18/2019, 04/16/2020, 05/10/2021, 06/07/2022   INFLUENZA, HIGH DOSE SEASONAL PF 03/12/2014, 07/14/2016, 07/17/2017, 07/05/2018   Influenza Split 06/28/2012   Influenza, Seasonal, Injecte, Preservative Fre 06/27/2013, 04/06/2015   Influenza,inj,Quad PF,6+ Mos 06/27/2013, 04/06/2015   Moderna SARS-COV2 Booster Vaccination 06/18/2020   Moderna Sars-Covid-2 Vaccination 08/15/2019, 09/12/2019   Pneumococcal Conjugate-13 04/06/2015   Pneumococcal Polysaccharide-23 07/17/2017, 08/23/2022   Tdap 02/01/2007, 07/14/2016, 06/10/2021   Zoster Recombinant(Shingrix ) 11/16/2021    Screening Tests Health Maintenance  Topic Date Due   COVID-19 Vaccine (3 - Moderna risk series) 07/16/2020   Zoster Vaccines- Shingrix  (2 of 2) 01/11/2022   Influenza Vaccine  01/19/2024   Medicare Annual Wellness (AWV)  03/01/2025   DTaP/Tdap/Td (4 - Td or Tdap) 06/11/2031   Pneumococcal Vaccine: 50+ Years  Completed   Hepatitis C Screening  Completed   HPV VACCINES   Aged Out   Meningococcal B Vaccine  Aged Out   Colonoscopy  Discontinued   Fecal DNA (Cologuard)  Discontinued    Health Maintenance Items Addressed:  03/01/2024  Additional Screening:  Vision Screening: Recommended annual ophthalmology exams for early detection of glaucoma and other disorders of the eye. Is the patient up to date with their annual eye exam?  Yes  Who is the provider or what is the name of the office in which the patient attends annual eye exams? [facility]   Dental Screening: Recommended annual dental exams for proper oral hygiene  Community Resource Referral / Chronic Care Management: CRR required this visit?  No   CCM required this visit?  No   Plan:    I have personally reviewed and noted the following in the patient's chart:   Medical and social history Use of alcohol, tobacco or illicit drugs  Current medications and supplements including opioid prescriptions. Patient is not currently taking opioid prescriptions. Functional ability and status Nutritional status  Physical activity Advanced directives List of other physicians Hospitalizations, surgeries, and ER visits in previous 12 months Vitals Screenings to include cognitive, depression, and falls Referrals and appointments  In addition, I have reviewed and discussed with patient certain preventive protocols, quality metrics, and best practice recommendations. A written personalized care plan for preventive services as well as general preventive health recommendations were provided to patient.   Verdie CHRISTELLA Saba, CMA   03/01/2024   After Visit Summary: (MyChart) Due to this being a telephonic visit, the after visit summary with patients personalized plan was offered to patient via MyChart   Notes: Nothing significant to report at this time.

## 2024-03-01 NOTE — Patient Instructions (Addendum)
 Samuel Fisher,  Thank you for taking the time for your Medicare Wellness Visit. I appreciate your continued commitment to your health goals. Please review the care plan we discussed, and feel free to reach out if I can assist you further.  Medicare recommends these wellness visits once per year to help you and your care team stay ahead of potential health issues. These visits are designed to focus on prevention, allowing your provider to concentrate on managing your acute and chronic conditions during your regular appointments.  Please note that Annual Wellness Visits do not include a physical exam. Some assessments may be limited, especially if the visit was conducted virtually. If needed, we may recommend a separate in-person follow-up with your provider.  Ongoing Care Seeing your primary care provider every 3 to 6 months helps us  monitor your health and provide consistent, personalized care.   Referrals If a referral was made during today's visit and you haven't received any updates within two weeks, please contact the referred provider directly to check on the status.  Recommended Screenings:  Health Maintenance  Topic Date Due   COVID-19 Vaccine (3 - Moderna risk series) 07/16/2020   Zoster (Shingles) Vaccine (2 of 2) 01/11/2022   Flu Shot  01/19/2024   Medicare Annual Wellness Visit  03/01/2025   DTaP/Tdap/Td vaccine (4 - Td or Tdap) 06/11/2031   Pneumococcal Vaccine for age over 22  Completed   Hepatitis C Screening  Completed   HPV Vaccine  Aged Out   Meningitis B Vaccine  Aged Out   Colon Cancer Screening  Discontinued   Cologuard (Stool DNA test)  Discontinued       03/01/2024   11:05 AM  Advanced Directives  Does Patient Have a Medical Advance Directive? Yes  Type of Estate agent of [place];Living will  Copy of Healthcare Power of Attorney in Chart? No - copy requested   Advance Care Planning is important because it: Ensures you receive medical  care that aligns with your values, goals, and preferences. Provides guidance to your family and loved ones, reducing the emotional burden of decision-making during critical moments.  Vision: Annual vision screenings are recommended for early detection of glaucoma, cataracts, and diabetic retinopathy. These exams can also reveal signs of chronic conditions such as diabetes and high blood pressure.  Dental: Annual dental screenings help detect early signs of oral cancer, gum disease, and other conditions linked to overall health, including heart disease and diabetes.

## 2024-03-05 ENCOUNTER — Encounter: Payer: Self-pay | Admitting: Internal Medicine

## 2024-03-05 ENCOUNTER — Ambulatory Visit: Admitting: Internal Medicine

## 2024-03-05 VITALS — BP 136/80 | HR 66 | Temp 98.6°F | Ht 68.0 in | Wt 165.8 lb

## 2024-03-05 DIAGNOSIS — I471 Supraventricular tachycardia, unspecified: Secondary | ICD-10-CM

## 2024-03-05 DIAGNOSIS — I10 Essential (primary) hypertension: Secondary | ICD-10-CM

## 2024-03-05 DIAGNOSIS — E538 Deficiency of other specified B group vitamins: Secondary | ICD-10-CM

## 2024-03-05 DIAGNOSIS — D518 Other vitamin B12 deficiency anemias: Secondary | ICD-10-CM

## 2024-03-05 DIAGNOSIS — I480 Paroxysmal atrial fibrillation: Secondary | ICD-10-CM

## 2024-03-05 DIAGNOSIS — C61 Malignant neoplasm of prostate: Secondary | ICD-10-CM | POA: Diagnosis not present

## 2024-03-05 DIAGNOSIS — E785 Hyperlipidemia, unspecified: Secondary | ICD-10-CM | POA: Diagnosis not present

## 2024-03-05 DIAGNOSIS — Z23 Encounter for immunization: Secondary | ICD-10-CM | POA: Diagnosis not present

## 2024-03-05 MED ORDER — CYANOCOBALAMIN 1000 MCG/ML IJ SOLN
1000.0000 ug | Freq: Once | INTRAMUSCULAR | Status: AC
  Administered 2024-03-05: 1000 ug via INTRAMUSCULAR

## 2024-03-05 NOTE — Progress Notes (Addendum)
 "  Subjective:  Patient ID: Samuel Fisher, male    DOB: Dec 31, 1945  Age: 78 y.o. MRN: [id number]  CC: Anemia, Hypertension, and Hyperlipidemia   HPI Samuel Fisher presents for f/up ----  Discussed the use of AI scribe software for clinical note transcription with the patient, who gave verbal consent to proceed.  History of Present Illness Samuel Fisher is a 78 year old male who presents for follow-up and anemia evaluation.  He feels generally well but notes a decrease in speed and strength compared to the past. He remains active, exercising two to three times a week, and is involved with his grandchildren. He mentions a loss of balance which has affected his ability to play golf.  He was previously hospitalized at [facility] for fluid on the lungs. He reports that he lost half of his capacity in his right lung because the fluid turned into gelatinous material that became intertwined with the lining of the lung. He reports that he has about 75% of his lung capacity and continues to sing in the choir. No chest pain, shortness of breath, coughing, wheezing, or palpitations.  He has a history of prostate cancer, with half of his prostate removed and one encapsulated cancerous area remaining. He is not currently receiving treatment for prostate cancer but undergoes regular PSA monitoring.  He is being treated for hypertension with metoprolol , which he takes once daily, and another heart medication, which he describes as a 'little bitty thing'. He also uses Flonase.  He received a flu shot and a B12 shot recently, noting that he sometimes falls behind on the B12 shots due to travel. He was previously noted to be anemic. Regular bowel movements and denies any other significant symptoms.     Outpatient Medications Prior to Visit  Medication Sig Dispense Refill   albuterol  (VENTOLIN  HFA) 108 (90 Base) MCG/ACT inhaler Inhale 2 puffs into the lungs every 6 (six) hours as needed. 18 g 0    Cyanocobalamin  (VITAMIN B-12 IJ) Inject 1,000 mcg as directed every 30 (thirty) days.     diazepam (VALIUM) 10 MG tablet Take 10 mg by mouth once as needed for anxiety (once if needed for dental procedures).     fluocinonide -emollient (LIDEX -E) 0.05 % cream Apply 1 Application topically 2 (two) times daily. 120 g 1   hydrALAZINE  (APRESOLINE ) 25 MG tablet Take 1 tablet (25 mg total) by mouth 3 (three) times daily. 270 tablet 0   metoprolol  succinate (TOPROL -XL) 100 MG 24 hr tablet Take 1 tablet (100 mg total) by mouth daily. 90 tablet 0   rosuvastatin  (CRESTOR ) 10 MG tablet Take 1 tablet (10 mg total) by mouth daily. 90 tablet 1   torsemide  (DEMADEX ) 10 MG tablet Take 1 tablet (10 mg total) by mouth daily. 90 tablet 0   White Petrolatum-Mineral Oil (ARTIFICIAL TEARS) 83-15 % OINT Apply 1 Application to eye.     Facility-Administered Medications Prior to Visit  Medication Dose Route Frequency Provider Last Rate Last Admin   cyanocobalamin  (VITAMIN B12) injection 1,000 mcg  1,000 mcg Intramuscular Q30 days Joshua Debby CROME, MD   1,000 mcg at 02/15/23 1410    ROS Review of Systems  Constitutional:  Positive for fatigue. Negative for appetite change, chills, diaphoresis and fever.  HENT:  Negative for sinus pressure and trouble swallowing.   Eyes: Negative.   Respiratory: Negative.  Negative for cough, chest tightness, shortness of breath and wheezing.   Cardiovascular:  Negative for chest pain, palpitations and  leg swelling.  Gastrointestinal: Negative.  Negative for abdominal pain, blood in stool, constipation, diarrhea, nausea and vomiting.  Endocrine: Negative.   Genitourinary:  Negative for difficulty urinating.  Musculoskeletal:  Positive for gait problem. Negative for arthralgias and myalgias.  Skin: Negative.  Negative for color change.  Neurological:  Positive for facial asymmetry, speech difficulty and weakness. Negative for dizziness, syncope and numbness.  Hematological:  Negative  for adenopathy. Does not bruise/bleed easily.  Psychiatric/Behavioral:  Positive for decreased concentration and dysphoric mood. Negative for sleep disturbance. The patient is not nervous/anxious.     Objective:  BP 136/80 (BP Location: Left Arm, Patient Position: Sitting, Cuff Size: Normal)   Pulse 66   Temp 98.6 F (37 C) (Oral)   Ht 5' 8 (1.727 m)   Wt 165 lb 12.8 oz (75.2 kg)   SpO2 97%   BMI 25.21 kg/m   BP Readings from Last 3 Encounters:  03/05/24 136/80  11/30/23 124/76  09/28/23 138/70    Wt Readings from Last 3 Encounters:  03/05/24 165 lb 12.8 oz (75.2 kg)  03/01/24 163 lb (73.9 kg)  11/30/23 159 lb 9.6 oz (72.4 kg)    Physical Exam Vitals reviewed.  HENT:     Nose: Nose normal.     Mouth/Throat:     Mouth: Mucous membranes are moist.  Eyes:     General: No scleral icterus.    Conjunctiva/sclera: Conjunctivae normal.  Cardiovascular:     Rate and Rhythm: Normal rate and regular rhythm.     Pulses: Normal pulses.     Heart sounds: No murmur heard.    No friction rub. No gallop.  Pulmonary:     Effort: Pulmonary effort is normal.     Breath sounds: No decreased breath sounds, wheezing, rhonchi or rales.  Abdominal:     General: Abdomen is flat.     Palpations: There is no mass.     Tenderness: There is no abdominal tenderness. There is no guarding.     Hernia: No hernia is present.  Musculoskeletal:        General: Normal range of motion.     Cervical back: Neck supple.     Right lower leg: No edema.     Left lower leg: No edema.  Lymphadenopathy:     Cervical: No cervical adenopathy.  Skin:    General: Skin is warm and dry.     Findings: No rash.  Neurological:     Mental Status: He is alert. Mental status is at baseline.     Cranial Nerves: Cranial nerve deficit, dysarthria and facial asymmetry present.     Lab Results  Component Value Date   WBC 10.5 09/28/2023   HGB 11.3 (L) 09/28/2023   HCT 34.6 (L) 09/28/2023   PLT 325.0 09/28/2023    GLUCOSE 93 09/28/2023   CHOL 156 08/23/2022   TRIG 62.0 08/23/2022   HDL 73.90 08/23/2022   LDLCALC 70 08/23/2022   ALT 16 09/28/2023   AST 17 09/28/2023   NA 136 09/28/2023   K 3.9 09/28/2023   CL 101 09/28/2023   CREATININE 0.97 09/28/2023   BUN 16 09/28/2023   CO2 26 09/28/2023   TSH 2.19 09/14/2023   PSA 2.6 10/12/2023   HGBA1C 5.7 01/20/2021    MR Abdomen W Wo Contrast Result Date: 12/13/2023 CLINICAL DATA:  Pancreatic cystic lesions EXAM: MRI ABDOMEN WITHOUT AND WITH CONTRAST TECHNIQUE: Multiplanar multisequence MR imaging of the abdomen was performed both before and after the  administration of intravenous contrast. CONTRAST:  7 mL Vueway  gadolinium contrast IV COMPARISON:  03/09/2021 FINDINGS: Lower chest: Small, loculated appearing bilateral pleural effusions (series 5, image 4). Hepatobiliary: No solid liver abnormality is seen. No gallstones, gallbladder wall thickening, or biliary dilatation. Pancreas: Multiple fluid signal cystic lesions again seen scattered throughout the pancreas. Largest lesion in the ventral pancreatic tail reflects several confluent adjacent cysts with thin septation and overall conglomerate has slightly increased in size compared to prior examination dated 03/08/2021, on today's examination measuring 2.4 x 1.7 cm, previously 1.9 x 1.4 cm (series 5, image 13). Largest individual component when measured similarly to prior examination measures 1.6 x 1.5 cm, previously 1.3 x 1.2 cm (series 5, image 14). Multiple other cystic lesions in the pancreatic head are likewise very slightly increased in size, largest discrete lesion in the ventral pancreatic head measuring 1.2 x 1.0 cm, previously 0.9 cm (series 5, image 20). No solid component or suspicious contrast enhancement. No pancreatic ductal dilatation or surrounding inflammatory changes. Spleen: Normal in size without significant abnormality. Adrenals/Urinary Tract: Adrenal glands are unremarkable. Kidneys are  normal, without renal calculi, solid lesion, or hydronephrosis. Stomach/Bowel: Stomach is within normal limits. No evidence of bowel wall thickening, distention, or inflammatory changes. Pancolonic diverticulosis. Vascular/Lymphatic: No significant vascular findings are present. No enlarged abdominal lymph nodes. Other: No abdominal wall hernia or abnormality. No ascites. Musculoskeletal: No acute or significant osseous findings. IMPRESSION: 1. Multiple fluid signal cystic lesions again seen scattered throughout the pancreas. Largest lesion in the ventral pancreatic tail reflects several confluent adjacent cysts with thin septation and overall conglomerate has slightly increased in size compared to prior examination dated 03/08/2021, on today's examination measuring 2.4 x 1.7 cm, previously 1.9 x 1.4 cm. Largest individual component when measured similarly to prior examination measures 1.6 x 1.5 cm, previously 1.3 x 1.2 cm. Multiple other cystic lesions in the pancreatic head are likewise very slightly increased in size. No solid component or suspicious contrast enhancement. No pancreatic ductal dilatation or surrounding inflammatory changes. These again are consistent with multiple side branch IPMNs and very likely benign. 2. Small, loculated appearing bilateral pleural effusions. 3. Pancolonic diverticulosis. Electronically Signed   By: Marolyn JONETTA Jaksch M.D.   On: 12/13/2023 06:34    Assessment & Plan:  Need for immunization against influenza -     Flu vaccine HIGH DOSE PF(Fluzone Trivalent)  B12 deficiency -     Cyanocobalamin   Other vitamin B12 deficiency anemia- Will monitor his H/H -     CBC with Differential/Platelet; Future -     Folate; Future  Prostate cancer (HCC)- PSA was 2.6  Hyperlipidemia LDL goal <70 -     TSH; Future -     Lipid panel; Future  Essential hypertension, benign- BP is well controlled. -     Basic metabolic panel with GFR; Future -     TSH; Future  SVT  (supraventricular tachycardia) (HCC)- He has good R/R control.  PAF (paroxysmal atrial fibrillation) (HCC)- He has good R/R control.     Follow-up: Return in about 6 months (around 09/02/2024).  Debby Molt, MD "

## 2024-03-05 NOTE — Patient Instructions (Signed)
 Vitamin B12 Deficiency Vitamin B12 deficiency means that your body does not have enough vitamin B12. The body needs this important vitamin: To make red blood cells. To make genes (DNA). To help the nerves work. If you do not have enough vitamin B12 in your body, you can have health problems, such as not having enough red blood cells in the blood (anemia). What are the causes? Not eating enough foods that contain vitamin B12. Not being able to take in (absorb) vitamin B12 from the food that you eat. Certain diseases. A condition in which the body does not make enough of a certain protein. This results in your body not taking in enough vitamin B12. Having a surgery in which part of the stomach or small intestine is taken out. Taking medicines that make it hard for the body to take in vitamin B12. These include: Heartburn medicines. Some medicines that are used to treat diabetes. What increases the risk? Being an older adult. Eating a vegetarian or vegan diet that does not include any foods that come from animals. Not eating enough foods that contain vitamin B12 while you are pregnant. Taking certain medicines. Having alcoholism. What are the signs or symptoms? In some cases, there are no symptoms. If the condition leads to too few blood cells or nerve damage, symptoms can occur, such as: Feeling weak or tired. Not being hungry. Losing feeling (numbness) or tingling in your hands and feet. Redness and burning of the tongue. Feeling sad (depressed). Confusion or memory problems. Trouble walking. If anemia is very bad, symptoms can include: Being short of breath. Being dizzy. Having a very fast heartbeat. How is this treated? Changing the way you eat and drink, such as: Eating more foods that contain vitamin B12. Drinking little or no alcohol. Getting vitamin B12 shots. Taking vitamin B12 supplements by mouth (orally). Your doctor will tell you the dose that is best for you. Follow  these instructions at home: Eating and drinking  Eat foods that come from animals and have a lot of vitamin B12 in them. These include: Meats and poultry. This includes beef, pork, chicken, [place], and organ meats, such as liver. Seafood, such as clams, rainbow trout, salmon, tuna, and haddock. Eggs. Dairy foods such as milk, yogurt, and cheese. Eat breakfast cereals that have vitamin B12 added to them (are fortified). Check the label. The items listed above may not be a complete list of foods and beverages you can eat and drink. Contact a dietitian for more information. Alcohol use Do not drink alcohol if: Your doctor tells you not to drink. You are pregnant, may be pregnant, or are planning to become pregnant. If you drink alcohol: Limit how much you have to: 0-1 drink a day for women. 0-2 drinks a day for men. Know how much alcohol is in your drink. In the [place] one drink equals one 12 oz bottle of beer (355 mL), one 5 oz glass of wine (148 mL), or one 1 oz glass of hard liquor (44 mL). General instructions Get any vitamin B12 shots if told by your doctor. Take supplements only as told by your doctor. Follow the directions. Keep all follow-up visits. Contact a doctor if: Your symptoms come back. Your symptoms get worse or do not get better with treatment. Get help right away if: You have trouble breathing. You have a very fast heartbeat. You have chest pain. You get dizzy. You faint. These symptoms may be an emergency. Get help right away. Call 911.  Do not wait to see if the symptoms will go away. Do not drive yourself to the hospital. Summary Vitamin B12 deficiency means that your body is not getting enough of the vitamin. In some cases, there are no symptoms of this condition. Treatment may include making a change in the way you eat and drink, getting shots, or taking supplements. Eat foods that have vitamin B12 in them. This information is not intended to replace advice  given to you by your health care provider. Make sure you discuss any questions you have with your health care provider. Document Revised: 01/29/2021 Document Reviewed: 01/29/2021 Elsevier Patient Education  2024 [employer].

## 2024-04-11 ENCOUNTER — Ambulatory Visit

## 2024-04-11 DIAGNOSIS — E538 Deficiency of other specified B group vitamins: Secondary | ICD-10-CM

## 2024-04-11 MED ORDER — CYANOCOBALAMIN 1000 MCG/ML IJ SOLN
1000.0000 ug | Freq: Once | INTRAMUSCULAR | Status: AC
  Administered 2024-04-18: 1000 ug via INTRAMUSCULAR

## 2024-04-11 NOTE — Progress Notes (Signed)
 After obtaining consent, and per orders of Dr. Joshua, injection of B12 given by Edsel CHRISTELLA Kerns. Patient tolerated well.

## 2024-04-17 ENCOUNTER — Other Ambulatory Visit: Payer: Self-pay | Admitting: Internal Medicine

## 2024-04-17 DIAGNOSIS — I10 Essential (primary) hypertension: Secondary | ICD-10-CM

## 2024-04-17 DIAGNOSIS — I2510 Atherosclerotic heart disease of native coronary artery without angina pectoris: Secondary | ICD-10-CM

## 2024-04-18 DIAGNOSIS — E538 Deficiency of other specified B group vitamins: Secondary | ICD-10-CM | POA: Diagnosis not present

## 2024-05-06 DIAGNOSIS — I5020 Unspecified systolic (congestive) heart failure: Secondary | ICD-10-CM | POA: Diagnosis not present

## 2024-05-06 DIAGNOSIS — Z87891 Personal history of nicotine dependence: Secondary | ICD-10-CM | POA: Diagnosis not present

## 2024-05-06 DIAGNOSIS — E785 Hyperlipidemia, unspecified: Secondary | ICD-10-CM | POA: Diagnosis not present

## 2024-05-06 DIAGNOSIS — Z79899 Other long term (current) drug therapy: Secondary | ICD-10-CM | POA: Diagnosis not present

## 2024-05-06 DIAGNOSIS — I110 Hypertensive heart disease with heart failure: Secondary | ICD-10-CM | POA: Diagnosis not present

## 2024-05-06 DIAGNOSIS — J90 Pleural effusion, not elsewhere classified: Secondary | ICD-10-CM | POA: Diagnosis not present

## 2024-05-20 ENCOUNTER — Other Ambulatory Visit: Payer: Self-pay | Admitting: Internal Medicine

## 2024-05-20 DIAGNOSIS — I2510 Atherosclerotic heart disease of native coronary artery without angina pectoris: Secondary | ICD-10-CM

## 2024-05-20 DIAGNOSIS — I10 Essential (primary) hypertension: Secondary | ICD-10-CM

## 2024-05-29 ENCOUNTER — Ambulatory Visit

## 2024-05-29 DIAGNOSIS — D518 Other vitamin B12 deficiency anemias: Secondary | ICD-10-CM | POA: Diagnosis not present

## 2024-05-29 MED ORDER — CYANOCOBALAMIN 1000 MCG/ML IJ SOLN
1000.0000 ug | Freq: Once | INTRAMUSCULAR | Status: AC
  Administered 2024-05-29: 1000 ug via INTRAMUSCULAR

## 2024-05-29 NOTE — Progress Notes (Signed)
 Pt was given B12 injec w/o any complications at this time.

## 2024-05-30 ENCOUNTER — Other Ambulatory Visit: Payer: Self-pay | Admitting: Internal Medicine

## 2024-05-30 DIAGNOSIS — I2510 Atherosclerotic heart disease of native coronary artery without angina pectoris: Secondary | ICD-10-CM

## 2024-05-30 DIAGNOSIS — E785 Hyperlipidemia, unspecified: Secondary | ICD-10-CM

## 2024-07-08 ENCOUNTER — Ambulatory Visit: Admitting: Family Medicine

## 2024-07-08 ENCOUNTER — Encounter: Payer: Self-pay | Admitting: Family Medicine

## 2024-07-08 ENCOUNTER — Ambulatory Visit

## 2024-07-08 VITALS — BP 130/78 | HR 80 | Temp 98.4°F | Ht 68.0 in | Wt 167.4 lb

## 2024-07-08 DIAGNOSIS — Z8709 Personal history of other diseases of the respiratory system: Secondary | ICD-10-CM

## 2024-07-08 DIAGNOSIS — R051 Acute cough: Secondary | ICD-10-CM

## 2024-07-08 DIAGNOSIS — J069 Acute upper respiratory infection, unspecified: Secondary | ICD-10-CM | POA: Diagnosis not present

## 2024-07-08 DIAGNOSIS — B9689 Other specified bacterial agents as the cause of diseases classified elsewhere: Secondary | ICD-10-CM

## 2024-07-08 MED ORDER — AMOXICILLIN-POT CLAVULANATE 875-125 MG PO TABS: 1.0000 | ORAL_TABLET | Freq: Two times a day (BID) | ORAL | 0 refills | Status: AC

## 2024-07-08 NOTE — Progress Notes (Signed)
 "  Acute Office Visit  Subjective:     Patient ID: Samuel Fisher, male    DOB: Sep 09, 1945, 79 y.o.   MRN: [id number]  No chief complaint on file.   HPI  Discussed the use of AI scribe software for clinical note transcription with the patient, who gave verbal consent to proceed.  History of Present Illness Samuel Fisher is a 79 year old male who presents with a persistent cough and cold symptoms for three weeks.  Respiratory symptoms - Persistent productive cough for three weeks - Cold symptoms present for three weeks - Symptoms improved slightly with NyQuil and DayQuil but have plateaued - No significant shortness of breath - No documented fever  Constitutional symptoms - Occasional headaches - Chills - Drenching night sweats with soaked shirts - Decreased appetite - No gastrointestinal symptoms  Functional status - Unable to perform usual cardio and strength exercise routine for three weeks due to current symptoms  History of pleural effusion - Left-sided pleural effusion of unclear cause approximately 18 months ago - Required four thoracenteses (two in [place], two at [facility]), each removing over a liter of fluid - Irregular heart rhythm identified at that time with recommendation for cardiology follow-up - Last chest x-ray in March after thoracenteses  Allergic reactions - Allergic to bee stings and carries Benadryl - [place] - Controlled reaction with Benadryl without further complications     ROS Per HPI      Objective:    BP 130/78 (BP Location: Left Arm, Patient Position: Sitting)   Pulse 80   Temp 98.4 F (36.9 C) (Temporal)   Ht 5' 8 (1.727 m)   Wt 167 lb 6.4 oz (75.9 kg)   SpO2 95%   BMI 25.45 kg/m    Physical Exam Vitals and nursing note reviewed.  Constitutional:      General: He is not in acute distress.    Appearance: Normal appearance.  HENT:     Head: Normocephalic and atraumatic.      Right Ear: External ear normal.     Left Ear: External ear normal.     Nose: Nose normal.     Mouth/Throat:     Mouth: Mucous membranes are moist.     Pharynx: Oropharynx is clear.  Eyes:     Extraocular Movements: Extraocular movements intact.  Cardiovascular:     Rate and Rhythm: Normal rate and regular rhythm.     Pulses: Normal pulses.     Heart sounds: Normal heart sounds.  Pulmonary:     Effort: Pulmonary effort is normal. No respiratory distress.     Breath sounds: Normal breath sounds. No wheezing, rhonchi or rales.  Musculoskeletal:        General: Normal range of motion.     Cervical back: Normal range of motion.     Right lower leg: No edema.     Left lower leg: No edema.  Lymphadenopathy:     Cervical: No cervical adenopathy.  Skin:    General: Skin is warm and dry.  Neurological:     General: No focal deficit present.     Mental Status: He is alert and oriented to person, place, and time.  Psychiatric:        Mood and Affect: Mood normal.        Behavior: Behavior normal.     No results found for any visits on 07/08/24.      Assessment & Plan:  Assessment and Plan Assessment & Plan Bacterial upper respiratory infection, acute cough Persistent cough and mucus production for three weeks, likely viral. - Ordered chest x-ray to rule out complications. - Prescribed Augmentin  twice daily for one week. - Advised to eat with antibiotics to prevent stomach upset. - Instructed to report if no improvement in 2-3 days.  History of pleural effusion Previous pleural effusion with thoracentesis. - Ordered chest x-ray to assess for pleural effusion. - Refer to [facility] if x-ray shows fluid.     Orders Placed This Encounter  Procedures   DG Chest 2 View    Standing Status:   Future    Number of Occurrences:   1    Expiration Date:   01/05/2025    Reason for Exam (SYMPTOM  OR DIAGNOSIS REQUIRED):   cough, SOB    Release to patient:   Immediate     Preferred imaging location?:   [facility] ordered this encounter  Medications   amoxicillin -clavulanate (AUGMENTIN ) 875-125 MG tablet    Sig: Take 1 tablet by mouth 2 (two) times daily for 7 days.    Dispense:  14 tablet    Refill:  0    Return if symptoms worsen or fail to improve.  Corean LITTIE Ku, FNP  "

## 2024-07-08 NOTE — Patient Instructions (Addendum)
 We are getting an xray today. We will be in contact with any abnormal results that require further attention.  If xray looks normal, then I will get you antibiotic.  If there is any fluid on your lungs, we will get you set up to have it drained if needed.  I'm hoping this is just a respiratory infection and that all we will need is an antibiotic.  I have sent in Augmentin  for you to take twice a day for 7 days.  This medication can upset your stomach, so I tell everyone to take it with a meal.  Follow-up with me for new or worsening symptoms.

## 2024-07-11 ENCOUNTER — Ambulatory Visit: Payer: Self-pay | Admitting: Family Medicine

## 2024-09-04 ENCOUNTER — Ambulatory Visit: Admitting: Internal Medicine

## 2025-03-03 ENCOUNTER — Ambulatory Visit
